# Patient Record
Sex: Female | Born: 1990 | Race: White | Hispanic: No | Marital: Single | Smoking: Current every day smoker
Health system: Southern US, Community
[De-identification: ages and names within clinical notes are randomized; demographics above are authoritative.]

## PROBLEM LIST (undated history)

## (undated) DIAGNOSIS — R000 Tachycardia, unspecified: Secondary | ICD-10-CM

## (undated) DIAGNOSIS — K029 Dental caries, unspecified: Secondary | ICD-10-CM

## (undated) DIAGNOSIS — O1490 Unspecified pre-eclampsia, unspecified trimester: Secondary | ICD-10-CM

## (undated) DIAGNOSIS — F319 Bipolar disorder, unspecified: Secondary | ICD-10-CM

## (undated) DIAGNOSIS — B1920 Unspecified viral hepatitis C without hepatic coma: Secondary | ICD-10-CM

## (undated) DIAGNOSIS — K08409 Partial loss of teeth, unspecified cause, unspecified class: Secondary | ICD-10-CM

## (undated) DIAGNOSIS — Z87898 Personal history of other specified conditions: Secondary | ICD-10-CM

## (undated) DIAGNOSIS — F419 Anxiety disorder, unspecified: Secondary | ICD-10-CM

## (undated) DIAGNOSIS — F4310 Post-traumatic stress disorder, unspecified: Secondary | ICD-10-CM

## (undated) DIAGNOSIS — F17200 Nicotine dependence, unspecified, uncomplicated: Secondary | ICD-10-CM

## (undated) DIAGNOSIS — F32A Depression, unspecified: Secondary | ICD-10-CM

## (undated) DIAGNOSIS — F1191 Opioid use, unspecified, in remission: Secondary | ICD-10-CM

## (undated) DIAGNOSIS — F329 Major depressive disorder, single episode, unspecified: Secondary | ICD-10-CM

## (undated) DIAGNOSIS — B1910 Unspecified viral hepatitis B without hepatic coma: Secondary | ICD-10-CM

## (undated) DIAGNOSIS — Z9049 Acquired absence of other specified parts of digestive tract: Secondary | ICD-10-CM

## (undated) HISTORY — DX: Unspecified viral hepatitis C without hepatic coma: B19.20

## (undated) HISTORY — DX: Personal history of other specified conditions: Z87.898

## (undated) HISTORY — PX: ANTERIOR CRUCIATE LIGAMENT REPAIR: SHX115

## (undated) HISTORY — PX: APPENDECTOMY: SHX54

## (undated) HISTORY — PX: WISDOM TOOTH EXTRACTION: SHX21

## (undated) HISTORY — DX: Unspecified viral hepatitis B without hepatic coma: B19.10

## (undated) HISTORY — DX: Opioid use, unspecified, in remission: F11.91

---
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
## 2008-01-24 ENCOUNTER — Emergency Department (HOSPITAL_COMMUNITY): Admission: EM | Admit: 2008-01-24 | Discharge: 2008-01-24 | Payer: Self-pay | Admitting: Emergency Medicine

## 2008-07-14 ENCOUNTER — Inpatient Hospital Stay (HOSPITAL_COMMUNITY): Admission: AD | Admit: 2008-07-14 | Discharge: 2008-07-14 | Payer: Self-pay | Admitting: Family Medicine

## 2008-08-02 ENCOUNTER — Emergency Department (HOSPITAL_COMMUNITY): Admission: EM | Admit: 2008-08-02 | Discharge: 2008-08-02 | Payer: Self-pay | Admitting: Emergency Medicine

## 2008-09-25 ENCOUNTER — Emergency Department (HOSPITAL_COMMUNITY): Admission: EM | Admit: 2008-09-25 | Discharge: 2008-09-25 | Payer: Self-pay | Admitting: Emergency Medicine

## 2008-10-11 ENCOUNTER — Emergency Department (HOSPITAL_COMMUNITY): Admission: EM | Admit: 2008-10-11 | Discharge: 2008-10-12 | Payer: Self-pay | Admitting: Emergency Medicine

## 2008-10-12 ENCOUNTER — Other Ambulatory Visit: Payer: Self-pay | Admitting: Emergency Medicine

## 2008-10-12 ENCOUNTER — Ambulatory Visit: Payer: Self-pay | Admitting: Psychiatry

## 2008-10-12 ENCOUNTER — Inpatient Hospital Stay (HOSPITAL_COMMUNITY): Admission: AD | Admit: 2008-10-12 | Discharge: 2008-10-21 | Payer: Self-pay | Admitting: Psychiatry

## 2008-12-01 ENCOUNTER — Emergency Department (HOSPITAL_COMMUNITY): Admission: EM | Admit: 2008-12-01 | Discharge: 2008-12-01 | Payer: Self-pay | Admitting: Emergency Medicine

## 2008-12-13 ENCOUNTER — Emergency Department (HOSPITAL_COMMUNITY): Admission: EM | Admit: 2008-12-13 | Discharge: 2008-12-14 | Payer: Self-pay | Admitting: Emergency Medicine

## 2008-12-24 ENCOUNTER — Other Ambulatory Visit: Payer: Self-pay

## 2008-12-24 ENCOUNTER — Ambulatory Visit: Payer: Self-pay | Admitting: *Deleted

## 2008-12-24 ENCOUNTER — Other Ambulatory Visit: Payer: Self-pay | Admitting: Emergency Medicine

## 2008-12-24 ENCOUNTER — Inpatient Hospital Stay (HOSPITAL_COMMUNITY): Admission: RE | Admit: 2008-12-24 | Discharge: 2008-12-28 | Payer: Self-pay | Admitting: *Deleted

## 2009-02-01 ENCOUNTER — Emergency Department (HOSPITAL_COMMUNITY): Admission: EM | Admit: 2009-02-01 | Discharge: 2009-02-01 | Payer: Self-pay | Admitting: Emergency Medicine

## 2009-03-09 ENCOUNTER — Emergency Department (HOSPITAL_COMMUNITY): Admission: EM | Admit: 2009-03-09 | Discharge: 2009-03-10 | Payer: Self-pay | Admitting: Emergency Medicine

## 2009-03-21 ENCOUNTER — Ambulatory Visit: Payer: Self-pay | Admitting: Advanced Practice Midwife

## 2009-03-21 ENCOUNTER — Inpatient Hospital Stay (HOSPITAL_COMMUNITY): Admission: AD | Admit: 2009-03-21 | Discharge: 2009-03-21 | Payer: Self-pay | Admitting: Obstetrics & Gynecology

## 2009-03-28 ENCOUNTER — Emergency Department (HOSPITAL_COMMUNITY): Admission: EM | Admit: 2009-03-28 | Discharge: 2009-03-29 | Payer: Self-pay | Admitting: Emergency Medicine

## 2009-04-12 ENCOUNTER — Inpatient Hospital Stay (HOSPITAL_COMMUNITY): Admission: AD | Admit: 2009-04-12 | Discharge: 2009-04-12 | Payer: Self-pay | Admitting: Obstetrics

## 2009-04-16 ENCOUNTER — Inpatient Hospital Stay (HOSPITAL_COMMUNITY): Admission: AD | Admit: 2009-04-16 | Discharge: 2009-04-20 | Payer: Self-pay | Admitting: Obstetrics

## 2009-04-19 IMAGING — US US TRANSVAGINAL NON-OB
1 series · 14 of 25 positions shown · non-contrast
Comparison: 07/14/2008 pelvic ultrasound.

CLINICAL DATA: Vaginal bleeding.  Pain.

TRANSABDOMINAL AND TRANSVAGINAL ULTRASOUND OF PELVIS
TECHNIQUE: Both transabdominal and transvaginal ultrasound
examinations of the pelvis were performed including evaluation of
the uterus, ovaries, adnexal regions, and pelvic cul-de-sac.

[Series 1: us transvaginal non-ob · 0.26mm/px · 14 of 35 slices shown]
[im 1/35]
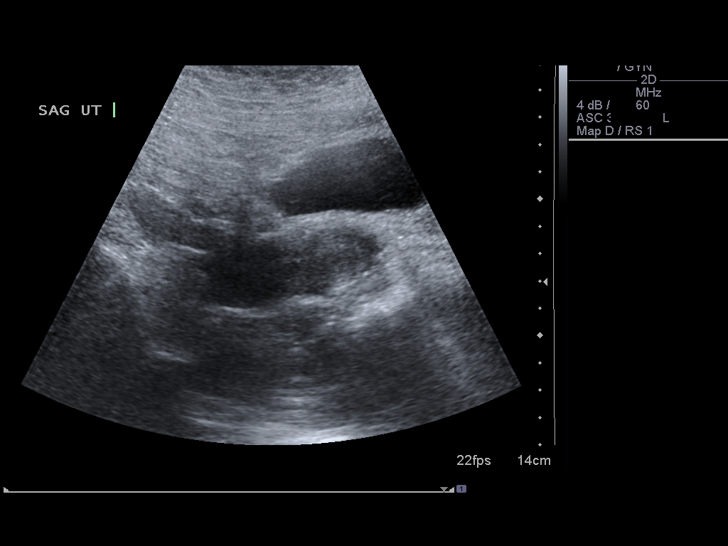
[im 3/35]
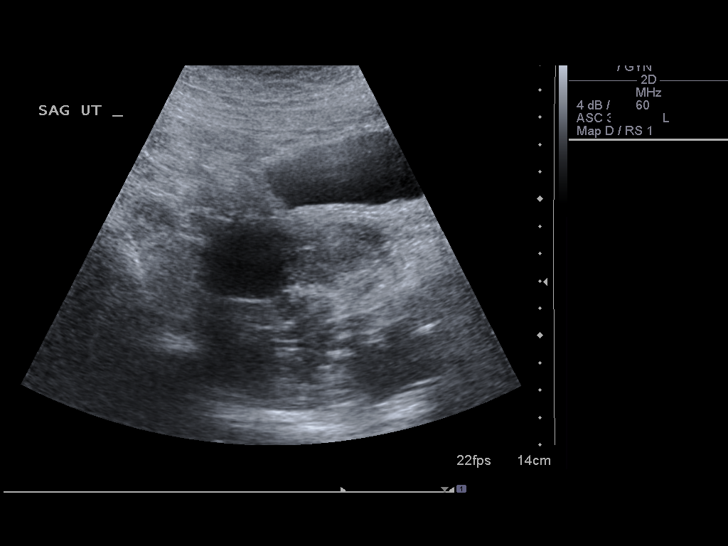
[im 6/35]
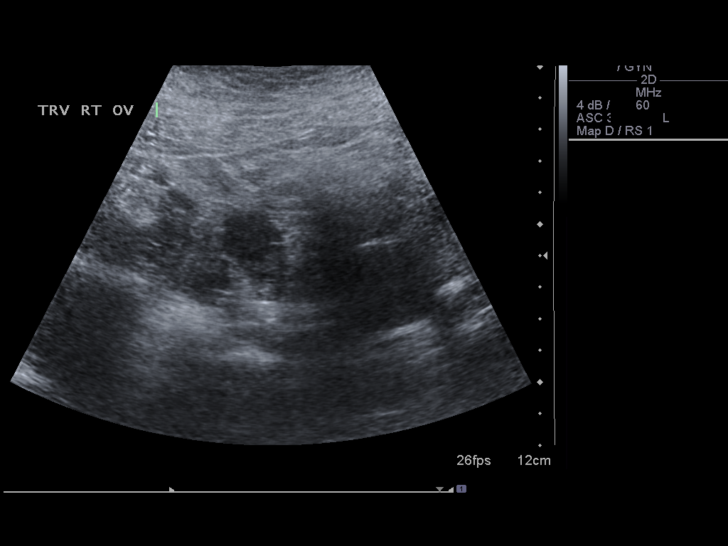
[im 9/35]
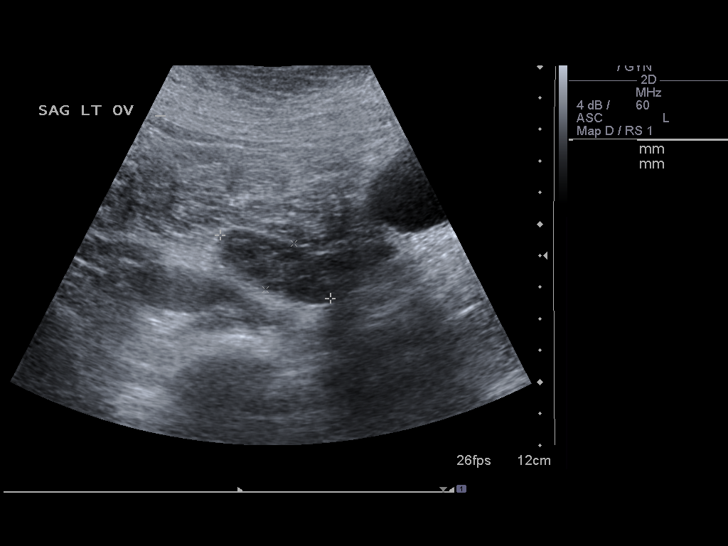
[im 12/35]
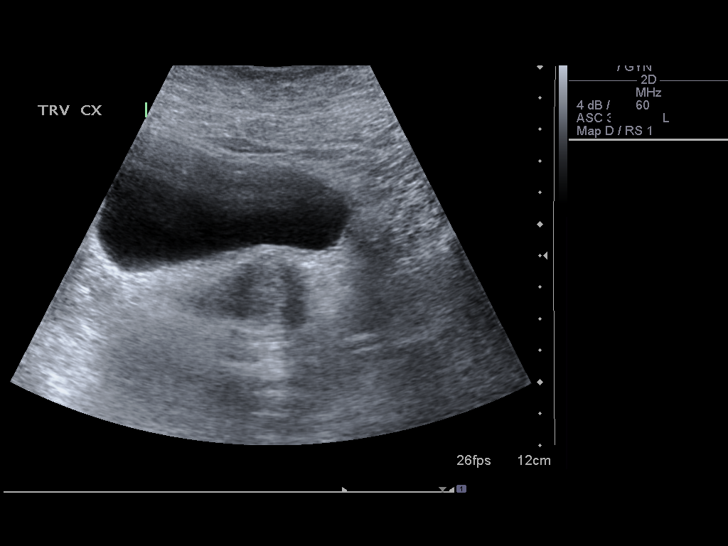
[im 13/35]
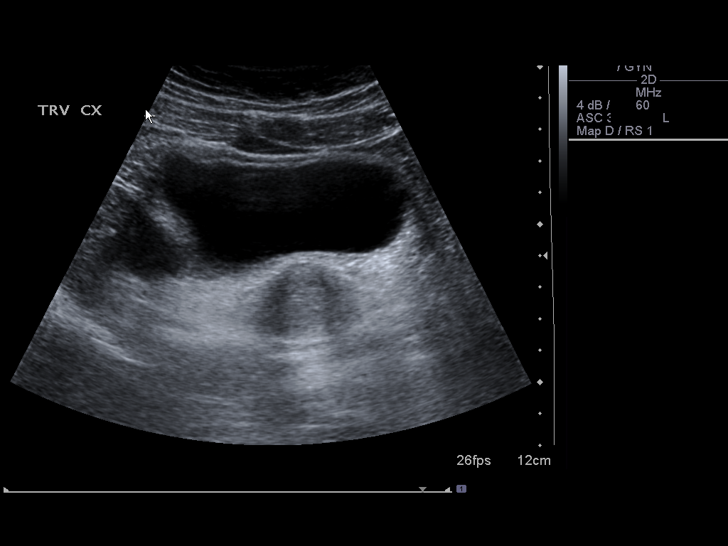
[im 16/35]
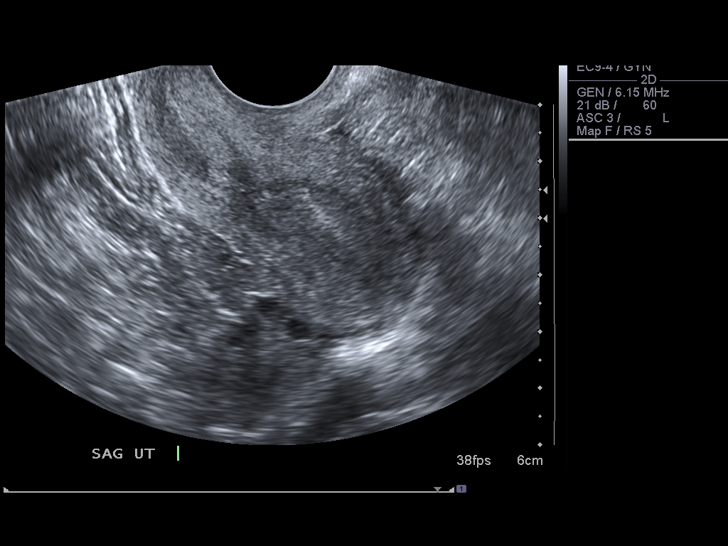
[im 19/35]
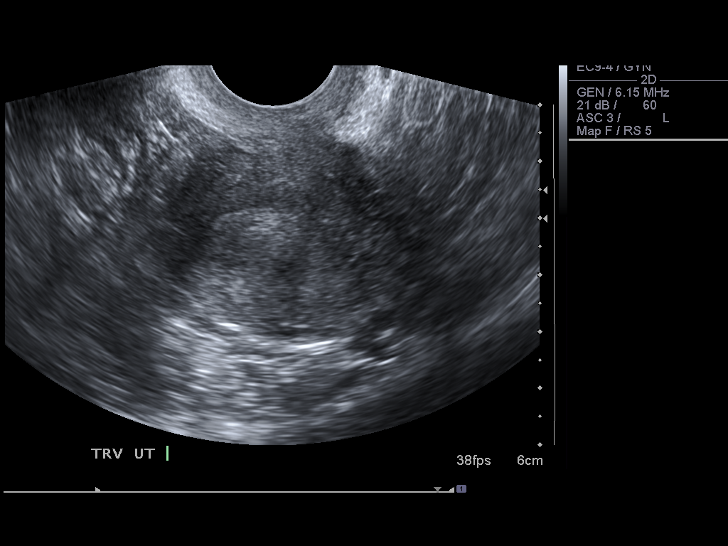
[im 22/35]
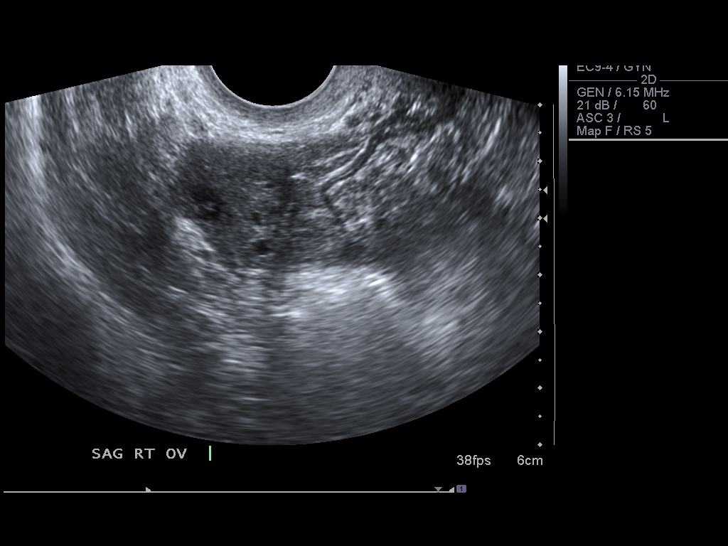
[im 23/35]
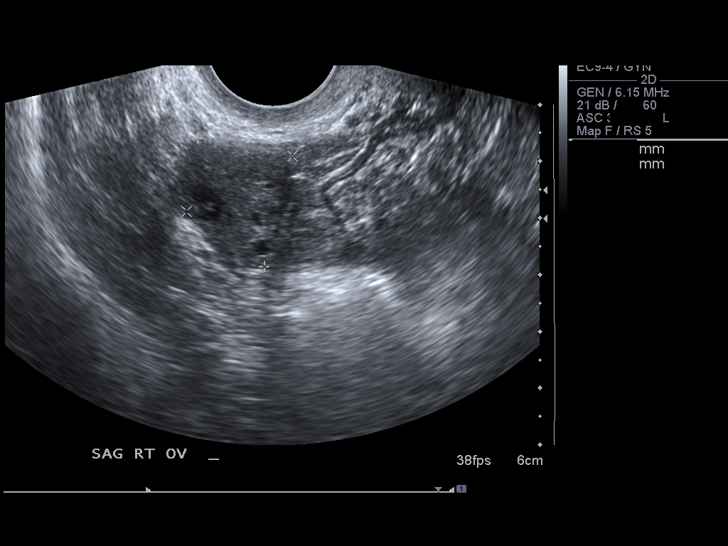
[im 26/35]
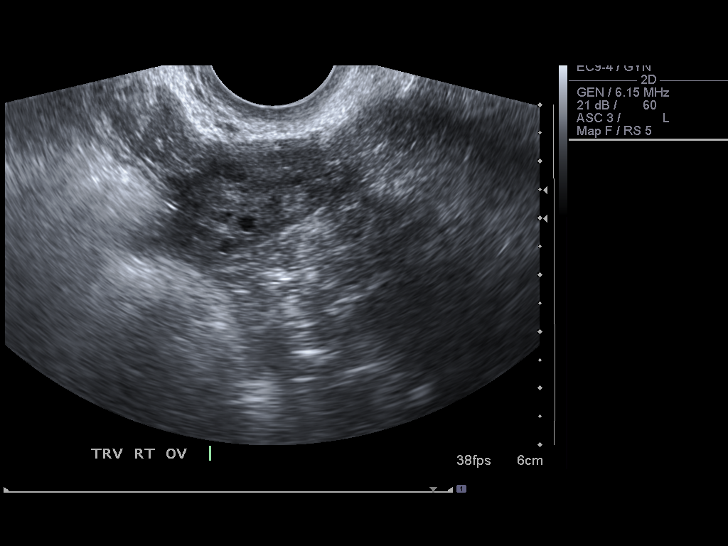
[im 29/35]
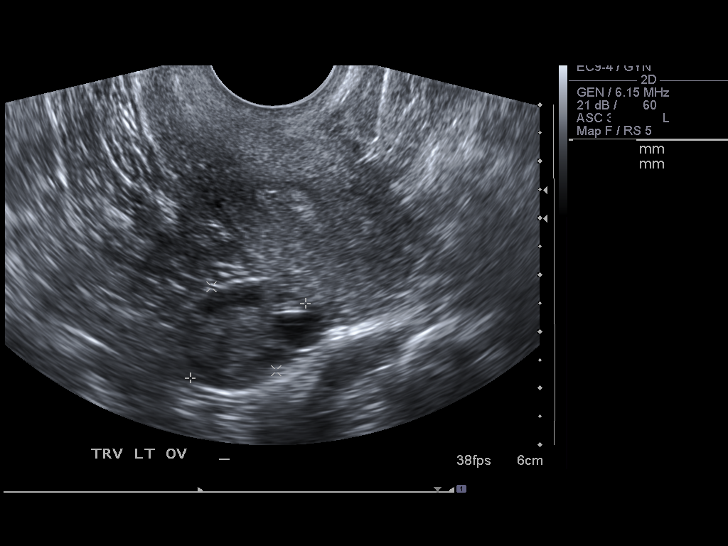
[im 32/35]
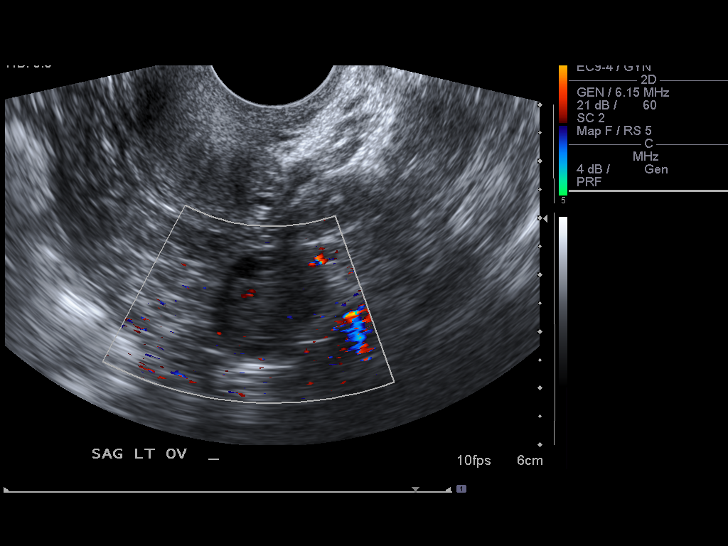
[im 35/35]
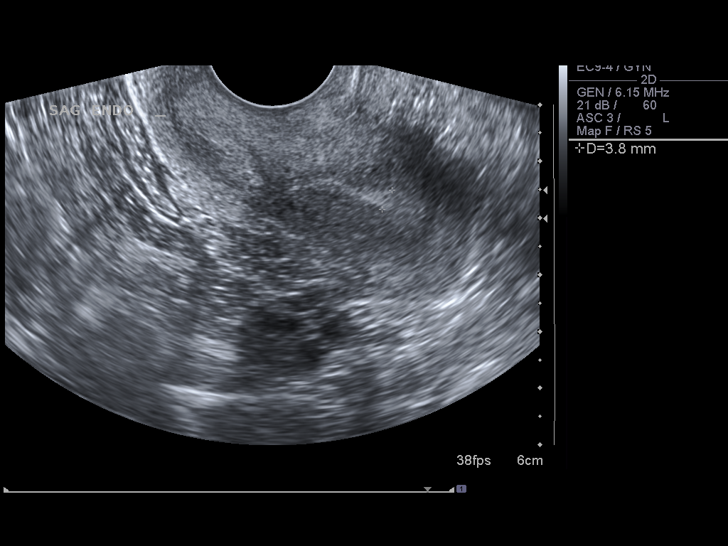

[14 of 25 positions shown; findings below may reference images not displayed]

FINDINGS: The uterus is retroverted measuring 6.3 x 4.3 x 3.4 cm.
Endometrial stripe thickness is 0.4 cm.  The right ovary measures
3.0 x 2.5 x 2.1 cm left ovary measures 2.4 x 2.1 x 1.9 cm.  Both
ovaries appear normal.  No free pelvic fluid. No mass.
IMPRESSION: Normal pelvic ultrasound.

## 2009-06-05 ENCOUNTER — Inpatient Hospital Stay (HOSPITAL_COMMUNITY): Admission: AD | Admit: 2009-06-05 | Discharge: 2009-06-05 | Payer: Self-pay | Admitting: Obstetrics

## 2009-06-21 ENCOUNTER — Ambulatory Visit (HOSPITAL_COMMUNITY): Admission: RE | Admit: 2009-06-21 | Discharge: 2009-06-21 | Payer: Self-pay | Admitting: Obstetrics

## 2009-07-02 ENCOUNTER — Ambulatory Visit: Payer: Self-pay | Admitting: Obstetrics and Gynecology

## 2009-07-02 ENCOUNTER — Inpatient Hospital Stay (HOSPITAL_COMMUNITY): Admission: AD | Admit: 2009-07-02 | Discharge: 2009-07-02 | Payer: Self-pay | Admitting: Obstetrics & Gynecology

## 2009-09-07 ENCOUNTER — Ambulatory Visit (HOSPITAL_COMMUNITY): Admission: RE | Admit: 2009-09-07 | Discharge: 2009-09-07 | Payer: Self-pay | Admitting: Obstetrics

## 2009-09-21 ENCOUNTER — Inpatient Hospital Stay (HOSPITAL_COMMUNITY): Admission: AD | Admit: 2009-09-21 | Discharge: 2009-09-22 | Payer: Self-pay | Admitting: Obstetrics & Gynecology

## 2009-09-22 ENCOUNTER — Inpatient Hospital Stay (HOSPITAL_COMMUNITY): Admission: AD | Admit: 2009-09-22 | Discharge: 2009-09-22 | Payer: Self-pay | Admitting: Obstetrics

## 2009-10-25 ENCOUNTER — Inpatient Hospital Stay (HOSPITAL_COMMUNITY): Admission: AD | Admit: 2009-10-25 | Discharge: 2009-10-25 | Payer: Self-pay | Admitting: Obstetrics

## 2009-11-23 ENCOUNTER — Inpatient Hospital Stay (HOSPITAL_COMMUNITY)
Admission: AD | Admit: 2009-11-23 | Discharge: 2009-11-25 | Payer: Self-pay | Source: Home / Self Care | Admitting: Obstetrics

## 2009-11-28 ENCOUNTER — Inpatient Hospital Stay (HOSPITAL_COMMUNITY)
Admission: AD | Admit: 2009-11-28 | Discharge: 2009-12-03 | Payer: Self-pay | Source: Home / Self Care | Admitting: Obstetrics & Gynecology

## 2009-11-30 ENCOUNTER — Encounter: Payer: Self-pay | Admitting: Obstetrics & Gynecology

## 2009-11-30 DIAGNOSIS — O1490 Unspecified pre-eclampsia, unspecified trimester: Secondary | ICD-10-CM

## 2009-12-30 ENCOUNTER — Inpatient Hospital Stay (HOSPITAL_COMMUNITY): Admission: AD | Admit: 2009-12-30 | Discharge: 2009-12-30 | Payer: Self-pay | Admitting: Obstetrics

## 2010-01-03 ENCOUNTER — Inpatient Hospital Stay (HOSPITAL_COMMUNITY): Admission: AD | Admit: 2010-01-03 | Discharge: 2010-01-03 | Payer: Self-pay | Admitting: Obstetrics & Gynecology

## 2010-01-03 ENCOUNTER — Ambulatory Visit: Payer: Self-pay | Admitting: Nurse Practitioner

## 2010-01-30 ENCOUNTER — Emergency Department (HOSPITAL_COMMUNITY)
Admission: EM | Admit: 2010-01-30 | Discharge: 2010-01-31 | Payer: Self-pay | Source: Home / Self Care | Admitting: Emergency Medicine

## 2010-04-01 ENCOUNTER — Emergency Department (HOSPITAL_COMMUNITY): Admission: EM | Admit: 2010-04-01 | Discharge: 2010-04-01 | Payer: Self-pay | Admitting: Emergency Medicine

## 2010-05-24 ENCOUNTER — Emergency Department (HOSPITAL_COMMUNITY): Admission: EM | Admit: 2010-05-24 | Discharge: 2010-05-24 | Payer: Self-pay | Admitting: Family Medicine

## 2010-05-31 ENCOUNTER — Encounter: Admission: RE | Admit: 2010-05-31 | Discharge: 2010-05-31 | Payer: Self-pay | Admitting: Gastroenterology

## 2010-06-09 ENCOUNTER — Emergency Department (HOSPITAL_COMMUNITY): Admission: EM | Admit: 2010-06-09 | Discharge: 2010-06-09 | Payer: Self-pay | Admitting: Emergency Medicine

## 2010-10-04 LAB — RAPID STREP SCREEN (MED CTR MEBANE ONLY): Streptococcus, Group A Screen (Direct): NEGATIVE

## 2010-10-04 LAB — STREP A DNA PROBE: Group A Strep Probe: NEGATIVE

## 2010-10-09 LAB — COMPREHENSIVE METABOLIC PANEL
Alkaline Phosphatase: 67 U/L (ref 39–117)
BUN: 11 mg/dL (ref 6–23)
Chloride: 106 mEq/L (ref 96–112)
GFR calc non Af Amer: >60 mL/min (ref >60)
Glucose, Bld: 77 mg/dL (ref 70–99)
Potassium: 3.6 mEq/L (ref 3.5–5.1)
Total Bilirubin: 0.6 mg/dL (ref 0.3–1.2)

## 2010-10-09 LAB — WET PREP, GENITAL: Trich, Wet Prep: NONE SEEN

## 2010-10-09 LAB — RAPID URINE DRUG SCREEN, HOSP PERFORMED
Amphetamines: NOT DETECTED
Barbiturates: NOT DETECTED
Benzodiazepines: NOT DETECTED
Opiates: POSITIVE — AB
Tetrahydrocannabinol: NOT DETECTED

## 2010-10-09 LAB — URINALYSIS, ROUTINE W REFLEX MICROSCOPIC
Bilirubin Urine: NEGATIVE
Glucose, UA: NEGATIVE mg/dL
Hgb urine dipstick: NEGATIVE
Specific Gravity, Urine: 1.021 (ref 1.005–1.030)
pH: 5.5 (ref 5.0–8.0)

## 2010-10-09 LAB — DIFFERENTIAL
Basophils Absolute: 0 10*3/uL (ref 0.0–0.1)
Basophils Relative: 0 % (ref 0–1)
Neutro Abs: 7.3 10*3/uL (ref 1.7–7.7)
Neutrophils Relative %: 67 % (ref 43–77)

## 2010-10-09 LAB — CBC
Hemoglobin: 11.1 g/dL — ABNORMAL LOW (ref 12.0–15.0)
RDW: 18.3 % — ABNORMAL HIGH (ref 11.5–15.5)

## 2010-10-10 LAB — URINE MICROSCOPIC-ADD ON

## 2010-10-10 LAB — URINALYSIS, ROUTINE W REFLEX MICROSCOPIC
Bilirubin Urine: NEGATIVE
Ketones, ur: NEGATIVE mg/dL
Nitrite: NEGATIVE
Protein, ur: NEGATIVE mg/dL
Specific Gravity, Urine: >1.03 — ABNORMAL HIGH (ref 1.005–1.030)
Urobilinogen, UA: 0.2 mg/dL (ref 0.0–1.0)
pH: 5.5 (ref 5.0–8.0)

## 2010-10-10 LAB — CBC
HCT: 31.4 % — ABNORMAL LOW (ref 36.0–46.0)
Hemoglobin: 10.4 g/dL — ABNORMAL LOW (ref 12.0–15.0)
MCV: 76.2 fL — ABNORMAL LOW (ref 78.0–100.0)
Platelets: 259 10*3/uL (ref 150–400)
RBC: 4.12 MIL/uL (ref 3.87–5.11)
WBC: 9.3 10*3/uL (ref 4.0–10.5)

## 2010-10-10 LAB — WET PREP, GENITAL: Clue Cells Wet Prep HPF POC: NONE SEEN

## 2010-10-11 LAB — COMPREHENSIVE METABOLIC PANEL
Albumin: 2.8 g/dL — ABNORMAL LOW (ref 3.5–5.2)
Alkaline Phosphatase: 140 U/L — ABNORMAL HIGH (ref 39–117)
BUN: 3 mg/dL — ABNORMAL LOW (ref 6–23)
Creatinine, Ser: 0.44 mg/dL (ref 0.4–1.2)
Glucose, Bld: 61 mg/dL — ABNORMAL LOW (ref 70–99)
Potassium: 3.6 mEq/L (ref 3.5–5.1)
Total Bilirubin: 0.2 mg/dL — ABNORMAL LOW (ref 0.3–1.2)
Total Protein: 5.8 g/dL — ABNORMAL LOW (ref 6.0–8.3)

## 2010-10-11 LAB — CBC
HCT: 23.9 % — ABNORMAL LOW (ref 36.0–46.0)
HCT: 35.2 % — ABNORMAL LOW (ref 36.0–46.0)
HCT: 35.9 % — ABNORMAL LOW (ref 36.0–46.0)
HCT: 36.5 % (ref 36.0–46.0)
Hemoglobin: 12 g/dL (ref 12.0–15.0)
Hemoglobin: 12.1 g/dL (ref 12.0–15.0)
Hemoglobin: 12.6 g/dL (ref 12.0–15.0)
MCHC: 33.5 g/dL (ref 30.0–36.0)
MCV: 82 fL (ref 78.0–100.0)
MCV: 83.1 fL (ref 78.0–100.0)
Platelets: 116 10*3/uL — ABNORMAL LOW (ref 150–400)
Platelets: 134 10*3/uL — ABNORMAL LOW (ref 150–400)
RBC: 4.32 MIL/uL (ref 3.87–5.11)
RBC: 4.45 MIL/uL (ref 3.87–5.11)
RDW: 15.3 % (ref 11.5–15.5)
RDW: 15.5 % (ref 11.5–15.5)
WBC: 14.5 10*3/uL — ABNORMAL HIGH (ref 4.0–10.5)
WBC: 14.9 10*3/uL — ABNORMAL HIGH (ref 4.0–10.5)
WBC: 15.4 10*3/uL — ABNORMAL HIGH (ref 4.0–10.5)

## 2010-10-11 LAB — URIC ACID: Uric Acid, Serum: 7 mg/dL (ref 2.4–7.0)

## 2010-10-11 LAB — LACTATE DEHYDROGENASE: LDH: 166 U/L (ref 94–250)

## 2010-10-12 LAB — COMPREHENSIVE METABOLIC PANEL
ALT: 8 U/L (ref 0–35)
Albumin: 3 g/dL — ABNORMAL LOW (ref 3.5–5.2)
Alkaline Phosphatase: 133 U/L — ABNORMAL HIGH (ref 39–117)
BUN: 2 mg/dL — ABNORMAL LOW (ref 6–23)
Calcium: 9.1 mg/dL (ref 8.4–10.5)
Glucose, Bld: 71 mg/dL (ref 70–99)
Potassium: 3.9 mEq/L (ref 3.5–5.1)
Sodium: 137 mEq/L (ref 135–145)
Total Protein: 6.1 g/dL (ref 6.0–8.3)

## 2010-10-12 LAB — CBC
Hemoglobin: 11.9 g/dL — ABNORMAL LOW (ref 12.0–15.0)
RBC: 4.26 MIL/uL (ref 3.87–5.11)
WBC: 18 10*3/uL — ABNORMAL HIGH (ref 4.0–10.5)

## 2010-10-12 LAB — URINALYSIS, ROUTINE W REFLEX MICROSCOPIC
Nitrite: NEGATIVE
Specific Gravity, Urine: 1.02 (ref 1.005–1.030)
Urobilinogen, UA: 0.2 mg/dL (ref 0.0–1.0)
pH: 8 (ref 5.0–8.0)

## 2010-10-12 LAB — URIC ACID: Uric Acid, Serum: 5.6 mg/dL (ref 2.4–7.0)

## 2010-10-12 LAB — LACTATE DEHYDROGENASE: LDH: 157 U/L (ref 94–250)

## 2010-10-16 LAB — URINALYSIS, ROUTINE W REFLEX MICROSCOPIC
Leukocytes, UA: NEGATIVE
Nitrite: NEGATIVE
Nitrite: NEGATIVE
Protein, ur: 30 mg/dL — AB
Specific Gravity, Urine: 1.01 (ref 1.005–1.030)
Specific Gravity, Urine: >1.03 — ABNORMAL HIGH (ref 1.005–1.030)
Urobilinogen, UA: 0.2 mg/dL (ref 0.0–1.0)
Urobilinogen, UA: 0.2 mg/dL (ref 0.0–1.0)
pH: 6.5 (ref 5.0–8.0)

## 2010-10-16 LAB — URINE MICROSCOPIC-ADD ON

## 2010-10-21 ENCOUNTER — Ambulatory Visit: Payer: Medicare Other | Attending: Orthopedic Surgery

## 2010-10-21 DIAGNOSIS — M6281 Muscle weakness (generalized): Secondary | ICD-10-CM | POA: Insufficient documentation

## 2010-10-21 DIAGNOSIS — R269 Unspecified abnormalities of gait and mobility: Secondary | ICD-10-CM | POA: Insufficient documentation

## 2010-10-21 DIAGNOSIS — IMO0001 Reserved for inherently not codable concepts without codable children: Secondary | ICD-10-CM | POA: Insufficient documentation

## 2010-10-21 DIAGNOSIS — M25569 Pain in unspecified knee: Secondary | ICD-10-CM | POA: Insufficient documentation

## 2010-10-25 ENCOUNTER — Ambulatory Visit: Payer: Medicare Other | Admitting: Physical Therapy

## 2010-10-26 ENCOUNTER — Ambulatory Visit: Payer: Medicare Other | Attending: Orthopedic Surgery

## 2010-10-26 DIAGNOSIS — M25569 Pain in unspecified knee: Secondary | ICD-10-CM | POA: Insufficient documentation

## 2010-10-26 DIAGNOSIS — IMO0001 Reserved for inherently not codable concepts without codable children: Secondary | ICD-10-CM | POA: Insufficient documentation

## 2010-10-26 DIAGNOSIS — R269 Unspecified abnormalities of gait and mobility: Secondary | ICD-10-CM | POA: Insufficient documentation

## 2010-10-26 DIAGNOSIS — M6281 Muscle weakness (generalized): Secondary | ICD-10-CM | POA: Insufficient documentation

## 2010-10-26 LAB — GC/CHLAMYDIA PROBE AMP, GENITAL
Chlamydia, DNA Probe: NEGATIVE
GC Probe Amp, Genital: NEGATIVE

## 2010-10-26 LAB — DIFFERENTIAL
Basophils Relative: 0 % (ref 0–1)
Lymphs Abs: 2.1 10*3/uL (ref 0.7–4.0)
Monocytes Relative: 7 % (ref 3–12)
Neutro Abs: 10.3 10*3/uL — ABNORMAL HIGH (ref 1.7–7.7)
Neutrophils Relative %: 77 % (ref 43–77)

## 2010-10-26 LAB — WET PREP, GENITAL: Yeast Wet Prep HPF POC: NONE SEEN

## 2010-10-26 LAB — CBC
MCHC: 34.1 g/dL (ref 30.0–36.0)
Platelets: 190 10*3/uL (ref 150–400)
RBC: 4.3 MIL/uL (ref 3.87–5.11)
WBC: 13.4 10*3/uL — ABNORMAL HIGH (ref 4.0–10.5)

## 2010-10-26 LAB — URINALYSIS, ROUTINE W REFLEX MICROSCOPIC
Nitrite: NEGATIVE
Protein, ur: NEGATIVE mg/dL
Specific Gravity, Urine: 1.01 (ref 1.005–1.030)
Urobilinogen, UA: 0.2 mg/dL (ref 0.0–1.0)

## 2010-10-28 LAB — COMPREHENSIVE METABOLIC PANEL
ALT: 10 U/L (ref 0–35)
ALT: 33 U/L (ref 0–35)
AST: 49 U/L — ABNORMAL HIGH (ref 0–37)
Alkaline Phosphatase: 55 U/L (ref 39–117)
BUN: 2 mg/dL — ABNORMAL LOW (ref 6–23)
BUN: 5 mg/dL — ABNORMAL LOW (ref 6–23)
CO2: 18 mEq/L — ABNORMAL LOW (ref 19–32)
CO2: 21 mEq/L (ref 19–32)
CO2: 22 mEq/L (ref 19–32)
Calcium: 8.6 mg/dL (ref 8.4–10.5)
Calcium: 8.8 mg/dL (ref 8.4–10.5)
Calcium: 8.9 mg/dL (ref 8.4–10.5)
Chloride: 109 mEq/L (ref 96–112)
Creatinine, Ser: 0.39 mg/dL — ABNORMAL LOW (ref 0.4–1.2)
Creatinine, Ser: 0.45 mg/dL (ref 0.4–1.2)
GFR calc Af Amer: >60 mL/min (ref >60)
GFR calc non Af Amer: >60 mL/min (ref >60)
GFR calc non Af Amer: >60 mL/min (ref >60)
Glucose, Bld: 67 mg/dL — ABNORMAL LOW (ref 70–99)
Glucose, Bld: 79 mg/dL (ref 70–99)
Potassium: 3.3 mEq/L — ABNORMAL LOW (ref 3.5–5.1)
Sodium: 132 mEq/L — ABNORMAL LOW (ref 135–145)
Sodium: 135 mEq/L (ref 135–145)
Total Bilirubin: 0.4 mg/dL (ref 0.3–1.2)
Total Protein: 6.8 g/dL (ref 6.0–8.3)
Total Protein: 7.2 g/dL (ref 6.0–8.3)

## 2010-10-28 LAB — URINALYSIS, ROUTINE W REFLEX MICROSCOPIC
Bilirubin Urine: NEGATIVE
Bilirubin Urine: NEGATIVE
Hgb urine dipstick: NEGATIVE
Ketones, ur: NEGATIVE mg/dL
Nitrite: NEGATIVE
Nitrite: NEGATIVE
Nitrite: NEGATIVE
Protein, ur: NEGATIVE mg/dL
Specific Gravity, Urine: 1.025 (ref 1.005–1.030)
Specific Gravity, Urine: 1.025 (ref 1.005–1.030)
Specific Gravity, Urine: 1.03 (ref 1.005–1.030)
Urobilinogen, UA: 0.2 mg/dL (ref 0.0–1.0)
Urobilinogen, UA: 0.2 mg/dL (ref 0.0–1.0)
pH: 6 (ref 5.0–8.0)
pH: 6 (ref 5.0–8.0)

## 2010-10-28 LAB — CBC
HCT: 39.6 % (ref 36.0–46.0)
Hemoglobin: 12.2 g/dL (ref 12.0–15.0)
Hemoglobin: 13.5 g/dL (ref 12.0–15.0)
MCHC: 34 g/dL (ref 30.0–36.0)
MCHC: 34.1 g/dL (ref 30.0–36.0)
MCV: 83.8 fL (ref 78.0–100.0)
RBC: 4.3 MIL/uL (ref 3.87–5.11)
RBC: 4.73 MIL/uL (ref 3.87–5.11)
RDW: 14.8 % (ref 11.5–15.5)

## 2010-10-28 LAB — ABO/RH: ABO/RH(D): O POS

## 2010-10-28 LAB — HCG, QUANTITATIVE, PREGNANCY: hCG, Beta Chain, Quant, S: 11050 m[IU]/mL — ABNORMAL HIGH (ref <5)

## 2010-10-28 LAB — POCT PREGNANCY, URINE: Preg Test, Ur: POSITIVE

## 2010-10-28 LAB — WET PREP, GENITAL

## 2010-10-28 LAB — PREGNANCY, URINE: Preg Test, Ur: POSITIVE

## 2010-10-29 LAB — URINALYSIS, ROUTINE W REFLEX MICROSCOPIC
Bilirubin Urine: NEGATIVE
Glucose, UA: NEGATIVE mg/dL
Glucose, UA: NEGATIVE mg/dL
Ketones, ur: NEGATIVE mg/dL
Ketones, ur: NEGATIVE mg/dL
Protein, ur: NEGATIVE mg/dL
pH: 5.5 (ref 5.0–8.0)

## 2010-10-29 LAB — COMPREHENSIVE METABOLIC PANEL
ALT: 10 U/L (ref 0–35)
Albumin: 4.2 g/dL (ref 3.5–5.2)
Alkaline Phosphatase: 64 U/L (ref 39–117)
BUN: 8 mg/dL (ref 6–23)
Chloride: 108 mEq/L (ref 96–112)
Glucose, Bld: 91 mg/dL (ref 70–99)
Potassium: 3.3 mEq/L — ABNORMAL LOW (ref 3.5–5.1)
Total Bilirubin: 0.1 mg/dL — ABNORMAL LOW (ref 0.3–1.2)

## 2010-10-29 LAB — WET PREP, GENITAL: Trich, Wet Prep: NONE SEEN

## 2010-10-29 LAB — DIFFERENTIAL
Eosinophils Relative: 1 % (ref 0–5)
Lymphocytes Relative: 28 % (ref 12–46)
Lymphs Abs: 3.9 10*3/uL (ref 0.7–4.0)
Monocytes Absolute: 1.3 10*3/uL — ABNORMAL HIGH (ref 0.1–1.0)

## 2010-10-29 LAB — BASIC METABOLIC PANEL
GFR calc non Af Amer: >60 mL/min (ref >60)
Potassium: 3.3 mEq/L — ABNORMAL LOW (ref 3.5–5.1)
Sodium: 137 mEq/L (ref 135–145)

## 2010-10-29 LAB — CBC
HCT: 40.1 % (ref 36.0–46.0)
Hemoglobin: 13.6 g/dL (ref 12.0–15.0)
WBC: 14.2 10*3/uL — ABNORMAL HIGH (ref 4.0–10.5)

## 2010-10-29 LAB — POCT PREGNANCY, URINE: Preg Test, Ur: NEGATIVE

## 2010-10-29 LAB — RAPID URINE DRUG SCREEN, HOSP PERFORMED: Cocaine: NOT DETECTED

## 2010-10-29 LAB — HCG, QUANTITATIVE, PREGNANCY: hCG, Beta Chain, Quant, S: 1176 m[IU]/mL — ABNORMAL HIGH (ref <5)

## 2010-10-29 LAB — ETHANOL: Alcohol, Ethyl (B): <5 mg/dL (ref 0–10)

## 2010-10-31 ENCOUNTER — Ambulatory Visit: Payer: Medicare Other

## 2010-10-31 LAB — RAPID URINE DRUG SCREEN, HOSP PERFORMED
Barbiturates: NOT DETECTED
Benzodiazepines: POSITIVE — AB

## 2010-10-31 LAB — ETHANOL: Alcohol, Ethyl (B): <5 mg/dL (ref 0–10)

## 2010-10-31 LAB — DIFFERENTIAL
Basophils Absolute: 0.2 10*3/uL — ABNORMAL HIGH (ref 0.0–0.1)
Eosinophils Relative: 1 % (ref 0–5)
Lymphocytes Relative: 30 % (ref 12–46)
Lymphs Abs: 3.5 10*3/uL (ref 0.7–4.0)
Monocytes Absolute: 1.2 10*3/uL — ABNORMAL HIGH (ref 0.1–1.0)
Neutro Abs: 6.7 10*3/uL (ref 1.7–7.7)

## 2010-10-31 LAB — CBC
HCT: 38.3 % (ref 36.0–46.0)
Hemoglobin: 13.3 g/dL (ref 12.0–15.0)
RBC: 4.67 MIL/uL (ref 3.87–5.11)
WBC: 11.6 10*3/uL — ABNORMAL HIGH (ref 4.0–10.5)

## 2010-10-31 LAB — ACETAMINOPHEN LEVEL: Acetaminophen (Tylenol), Serum: 18.1 ug/mL (ref 10–30)

## 2010-10-31 LAB — BASIC METABOLIC PANEL
Calcium: 8.9 mg/dL (ref 8.4–10.5)
GFR calc Af Amer: >60 mL/min (ref >60)
GFR calc non Af Amer: >60 mL/min (ref >60)
Potassium: 3.4 mEq/L — ABNORMAL LOW (ref 3.5–5.1)
Sodium: 139 mEq/L (ref 135–145)

## 2010-10-31 LAB — TRICYCLICS SCREEN, URINE: TCA Scrn: NOT DETECTED

## 2010-10-31 LAB — PREGNANCY, URINE: Preg Test, Ur: NEGATIVE

## 2010-11-01 LAB — POCT I-STAT, CHEM 8
BUN: 10 mg/dL (ref 6–23)
BUN: 6 mg/dL (ref 6–23)
Creatinine, Ser: 0.7 mg/dL (ref 0.4–1.2)
Glucose, Bld: 60 mg/dL — ABNORMAL LOW (ref 70–99)
HCT: 39 % (ref 36.0–46.0)
Hemoglobin: 14.3 g/dL (ref 12.0–15.0)
Sodium: 135 mEq/L (ref 135–145)
TCO2: 28 mmol/L (ref 0–100)
TCO2: 26 mmol/L (ref 0–100)

## 2010-11-01 LAB — URINALYSIS, ROUTINE W REFLEX MICROSCOPIC
Glucose, UA: NEGATIVE mg/dL
Ketones, ur: NEGATIVE mg/dL
Nitrite: NEGATIVE
Protein, ur: NEGATIVE mg/dL
Protein, ur: NEGATIVE mg/dL
Urobilinogen, UA: 1 mg/dL (ref 0.0–1.0)
Urobilinogen, UA: 0.2 mg/dL (ref 0.0–1.0)

## 2010-11-01 LAB — URINE CULTURE
Colony Count: NO GROWTH
Culture: NO GROWTH

## 2010-11-01 LAB — GC/CHLAMYDIA PROBE AMP, GENITAL
Chlamydia, DNA Probe: NEGATIVE
GC Probe Amp, Genital: NEGATIVE

## 2010-11-01 LAB — SYPHILIS: RPR W/REFLEX TO RPR TITER AND TREPONEMAL ANTIBODIES, TRADITIONAL SCREENING AND DIAGNOSIS ALGORITHM: RPR Ser Ql: NONREACTIVE

## 2010-11-01 LAB — CBC
HCT: 39.8 % (ref 36.0–46.0)
Platelets: 249 10*3/uL (ref 150–400)
RDW: 14.9 % (ref 11.5–15.5)
WBC: 11.2 10*3/uL — ABNORMAL HIGH (ref 4.0–10.5)

## 2010-11-01 LAB — RAPID URINE DRUG SCREEN, HOSP PERFORMED
Amphetamines: NOT DETECTED
Barbiturates: NOT DETECTED
Cocaine: NOT DETECTED
Opiates: POSITIVE — AB
Tetrahydrocannabinol: NOT DETECTED

## 2010-11-01 LAB — APTT: aPTT: 33 s (ref 24–37)

## 2010-11-01 LAB — DIFFERENTIAL
Basophils Absolute: 0.1 10*3/uL (ref 0.0–0.1)
Lymphocytes Relative: 26 % (ref 12–46)
Lymphs Abs: 2.9 10*3/uL (ref 0.7–4.0)
Neutro Abs: 7 10*3/uL (ref 1.7–7.7)

## 2010-11-01 LAB — WET PREP, GENITAL
Clue Cells Wet Prep HPF POC: NONE SEEN
Yeast Wet Prep HPF POC: NONE SEEN

## 2010-11-01 LAB — POCT PREGNANCY, URINE
Preg Test, Ur: NEGATIVE
Preg Test, Ur: NEGATIVE

## 2010-11-01 LAB — PROTIME-INR
INR: 1 (ref 0.00–1.49)
Prothrombin Time: 13.8 s (ref 11.6–15.2)

## 2010-11-01 LAB — D-DIMER, QUANTITATIVE: D-Dimer, Quant: 0.38 ug/mL-FEU (ref 0.00–0.48)

## 2010-11-01 LAB — URINE MICROSCOPIC-ADD ON

## 2010-11-02 ENCOUNTER — Ambulatory Visit: Payer: Medicare Other

## 2010-11-03 LAB — RAPID URINE DRUG SCREEN, HOSP PERFORMED
Amphetamines: NOT DETECTED
Amphetamines: NOT DETECTED
Barbiturates: NOT DETECTED
Cocaine: NOT DETECTED
Cocaine: NOT DETECTED
Opiates: NOT DETECTED
Opiates: NOT DETECTED
Tetrahydrocannabinol: NOT DETECTED
Tetrahydrocannabinol: POSITIVE — AB

## 2010-11-03 LAB — POCT I-STAT, CHEM 8
BUN: 8 mg/dL (ref 6–23)
Calcium, Ion: 1.07 mmol/L — ABNORMAL LOW (ref 1.12–1.32)
Calcium, Ion: 1.17 mmol/L (ref 1.12–1.32)
Chloride: 111 mEq/L (ref 96–112)
Creatinine, Ser: 0.5 mg/dL (ref 0.4–1.2)
Glucose, Bld: 102 mg/dL — ABNORMAL HIGH (ref 70–99)
HCT: 39 % (ref 36.0–46.0)
Hemoglobin: 15 g/dL (ref 12.0–15.0)
Sodium: 139 mEq/L (ref 135–145)
TCO2: 18 mmol/L (ref 0–100)
TCO2: 21 mmol/L (ref 0–100)

## 2010-11-03 LAB — DIFFERENTIAL
Basophils Absolute: 0 10*3/uL (ref 0.0–0.1)
Basophils Relative: 0 % (ref 0–1)
Eosinophils Absolute: 0.1 10*3/uL (ref 0.0–0.7)
Eosinophils Relative: 1 % (ref 0–5)
Eosinophils Relative: 1 % (ref 0–5)
Lymphocytes Relative: 21 % (ref 12–46)
Lymphocytes Relative: 24 % (ref 12–46)
Lymphs Abs: 3.4 10*3/uL (ref 0.7–4.0)
Monocytes Absolute: 0.9 10*3/uL (ref 0.1–1.0)
Monocytes Absolute: 1.2 10*3/uL — ABNORMAL HIGH (ref 0.1–1.0)
Neutro Abs: 9.4 10*3/uL — ABNORMAL HIGH (ref 1.7–7.7)

## 2010-11-03 LAB — SALICYLATE LEVEL
Salicylate Lvl: <4 mg/dL (ref 2.8–20.0)
Salicylate Lvl: <4 mg/dL (ref 2.8–20.0)

## 2010-11-03 LAB — BASIC METABOLIC PANEL
Calcium: 8.9 mg/dL (ref 8.4–10.5)
GFR calc non Af Amer: >60 mL/min (ref >60)
Potassium: 3.7 mEq/L (ref 3.5–5.1)
Sodium: 140 mEq/L (ref 135–145)

## 2010-11-03 LAB — URINE MICROSCOPIC-ADD ON

## 2010-11-03 LAB — URINALYSIS, ROUTINE W REFLEX MICROSCOPIC
Bilirubin Urine: NEGATIVE
Glucose, UA: NEGATIVE mg/dL
Glucose, UA: NEGATIVE mg/dL
Hgb urine dipstick: NEGATIVE
Ketones, ur: NEGATIVE mg/dL
Ketones, ur: NEGATIVE mg/dL
Protein, ur: NEGATIVE mg/dL
Protein, ur: NEGATIVE mg/dL
Urobilinogen, UA: 1 mg/dL (ref 0.0–1.0)
Urobilinogen, UA: 0.2 mg/dL (ref 0.0–1.0)

## 2010-11-03 LAB — CBC
HCT: 37.2 % (ref 36.0–46.0)
HCT: 39.2 % (ref 36.0–46.0)
Hemoglobin: 12.7 g/dL (ref 12.0–15.0)
Hemoglobin: 13.2 g/dL (ref 12.0–15.0)
MCHC: 34 g/dL (ref 30.0–36.0)
Platelets: 206 10*3/uL (ref 150–400)
Platelets: 233 10*3/uL (ref 150–400)
RDW: 14 % (ref 11.5–15.5)
WBC: 14.3 10*3/uL — ABNORMAL HIGH (ref 4.0–10.5)

## 2010-11-03 LAB — WET PREP, GENITAL
Trich, Wet Prep: NONE SEEN
Yeast Wet Prep HPF POC: NONE SEEN

## 2010-11-03 LAB — HEPATIC FUNCTION PANEL
ALT: 9 U/L (ref 0–35)
Alkaline Phosphatase: 67 U/L (ref 39–117)
Bilirubin, Direct: 0.1 mg/dL (ref 0.0–0.3)
Indirect Bilirubin: 0.5 mg/dL (ref 0.3–0.9)

## 2010-11-03 LAB — ACETAMINOPHEN LEVEL
Acetaminophen (Tylenol), Serum: 15.1 ug/mL (ref 10–30)
Acetaminophen (Tylenol), Serum: <10 ug/mL — ABNORMAL LOW (ref 10–30)

## 2010-11-03 LAB — HCG, QUANTITATIVE, PREGNANCY: hCG, Beta Chain, Quant, S: <2 m[IU]/mL (ref <5)

## 2010-11-03 LAB — PREGNANCY, URINE: Preg Test, Ur: NEGATIVE

## 2010-11-03 LAB — GC/CHLAMYDIA PROBE AMP, GENITAL: Chlamydia, DNA Probe: NEGATIVE

## 2010-11-07 ENCOUNTER — Ambulatory Visit: Payer: Medicare Other

## 2010-11-09 ENCOUNTER — Ambulatory Visit: Payer: Medicare Other

## 2010-11-14 ENCOUNTER — Ambulatory Visit: Payer: Medicare Other

## 2010-11-15 ENCOUNTER — Ambulatory Visit: Payer: Medicare Other

## 2010-11-21 ENCOUNTER — Ambulatory Visit: Payer: Medicare Other

## 2010-11-24 ENCOUNTER — Ambulatory Visit: Payer: Medicare Other | Attending: Orthopedic Surgery

## 2010-11-24 DIAGNOSIS — IMO0001 Reserved for inherently not codable concepts without codable children: Secondary | ICD-10-CM | POA: Insufficient documentation

## 2010-11-24 DIAGNOSIS — R269 Unspecified abnormalities of gait and mobility: Secondary | ICD-10-CM | POA: Insufficient documentation

## 2010-11-24 DIAGNOSIS — M6281 Muscle weakness (generalized): Secondary | ICD-10-CM | POA: Insufficient documentation

## 2010-11-24 DIAGNOSIS — M25569 Pain in unspecified knee: Secondary | ICD-10-CM | POA: Insufficient documentation

## 2010-11-29 ENCOUNTER — Ambulatory Visit: Payer: Medicare Other

## 2010-11-30 ENCOUNTER — Ambulatory Visit: Payer: Medicare Other

## 2010-12-05 ENCOUNTER — Ambulatory Visit: Payer: Medicare Other

## 2010-12-06 NOTE — H&P (Signed)
[name], [name]                 ACCOUNT NO.:  [id number]   MEDICAL RECORD NO.:  [id number]          PATIENT TYPE:  INP   LOCATION:  5123                         FACILITY:  [facility]   PHYSICIAN:  Sharlet Salina T. Hoxworth, M.D.DATE OF BIRTH:  [birth year]   DATE OF ADMISSION:  11/17/2007  DATE OF DISCHARGE:  11/17/2007                              HISTORY & PHYSICAL   CHIEF COMPLAINT:  Right lower quadrant abdominal pain, nausea, vomiting.   HISTORY OF PRESENT ILLNESS:  [name] is an 20 year old female with a  3- to 4-week illness.  She initially presented with intermittent right  lower quadrant abdominal pain, nausea, vomiting, and some low right back  pain.  [facility].  Urinalysis  apparently showed UTI, I do not have those results.  She was treated  with a course of oral antibiotics for this.  She states she had no  improvement with this.  Her pain actually became gradually more frequent  and severe.  She re-presented to [facility] 3 days ago, and there was  concern clinically about pyelonephritis, and she was given a  prescription for oral Cipro.  She has been on this for 3 days but  presents to the [facility] Emergency Room today with worsening pain.  She describes now fairly constant right lower quadrant abdominal pain.  It is worse with any motion.  She has persistent nausea and vomiting and  has been vomiting in the emergency room.  Bowel movements have been  normal.  No fever or chills.  She has no chronic GI or abdominal  complaints or any history of any similar illness previously.  She  currently has no dysuria or frequency or hematuria.  She is on a normal  menstrual period without abnormal bleeding or discharge.   PAST MEDICAL HISTORY:  Surgery, none previously.  Medically, she has a  history of significant depression and alcoholism, currently in  Alcoholics Anonymous, and asthma.   CURRENT MEDICATIONS:  Albuterol inhaler and Cipro  prescription as above.   ALLERGIES:  MOTRIN and CODEINE.   SOCIAL HISTORY:  Currently, living with her parents, unemployed.  She  smokes a pack of cigarettes per day.  No alcohol in a number of months.  Denies drug use.   FAMILY HISTORY:  Noncontributory.   REVIEW OF SYSTEMS:  GENERAL:  No fever, chills.  HEENT:  No vision,  hearing, swallowing problems.  RESPIRATORY:  No recent shortness of  breath, cough, wheezing.  CARDIAC:  No chest pain, palpitations.  ABDOMEN/GI/GU:  As above.   PHYSICAL EXAM:  VITAL SIGNS:  Temperature is 97, pulse 81, respirations  20, blood pressure 101/72.  GENERAL:  She is a mildly obese young white female appears  uncomfortable.  SKIN:  There are multiple scars across her upper extremities, lower  extremities, and upper abdomen from self-injury in the past.  All  completely healed.  No rash or infection.  HEENT:  No palpable mass or thyromegaly.  Sclerae are nonicteric.  LUNGS:  Clear without wheezing or increased work of breathing.  CARDIAC:  Regular rate and rhythm.  No murmurs.  No edema.  ABDOMEN:  Bowel sounds are present.  There is well-localized right lower  quadrant tenderness with some guarding.  There is referred tenderness to  the right lower quadrant and the remainder the abdomen.  No discernible  masses or organomegaly.  PELVIC:  Performed by the ER physician reportedly negative, not  repeated.  EXTREMITIES:  No joint swelling or deformity.  NEUROLOGIC:  Alert, oriented.  Gait normal.   LABORATORY AND X-RAY:  White count is elevated at 15,000, hemoglobin 40.  Electrolytes normal.  UA negative.  Pregnancy negative.   CT scan of the abdomen and pelvis reviewed.  This shows an  appendicolith, but no obvious appendiceal inflammation.  No other  abnormalities.   ASSESSMENT/PLAN:  Persistent and worsening right lower quadrant  abdominal pain, nausea, and vomiting over 3 weeks.  She has elevated  white count and localized right lower  quadrant tenderness.  She has an  appendicolith, but no obvious appendiceal inflammation.  I am concerned  that she may have partially treated appendicitis on oral antibiotics.  She does have worsening presentation elevated white count, and I believe  the safest course would be to proceed with laparoscopic appendectomy.  This was discussed with the patient and her family, they are agreeable.  She is admitted for this procedure.      Lorne Skeens. Hoxworth, M.D.  Electronically Signed     BTH/MEDQ  D:  11/17/2007  T:  11/18/2007  Job:  [id number]

## 2010-12-06 NOTE — H&P (Signed)
[name], [name]                 ACCOUNT NO.:  [id number]   MEDICAL RECORD NO.:  [id number]          PATIENT TYPE:  IPS   LOCATION:  0302                          [facility]  [facility]   PHYSICIAN:  Jasmine Pang, M.D. DATE OF BIRTH:  1990-08-08   DATE OF ADMISSION:  12/24/2008  DATE OF DISCHARGE:                       PSYCHIATRIC ADMISSION ASSESSMENT   DATE OF ASSESSMENT:  December 24, 2008 and 9:30 a.m.   IDENTIFICATION:  A 20 year old female, single.  This is a voluntary  admission.   HISTORY OF THE PRESENT ILLNESS:  This is the sixth or so admission to  [facility] for this 20 year old with a history of  polysubstance abuse and mood disorder.  She reports relapsing on drugs  and having suicidal thoughts for the past couple of days.  She has a  history of prior suicide attempts by overdose more than once, and her  mobile crisis team member brought her to the emergency room.  She  reports that she has been doing crystal meth for the past 2 or 3 days,  heroin for 3-4 days and also taking some oral pain relievers including  Percocet and others when she could get her hands on them, having been  using drugs for possibly a week to a week and a half, although she was a  rather poor historian today.  Endorsing suicidal thoughts of possibly  overdosing or cutting herself.  She does endorse using IV heroin.  She  also has a history of self-mutilation with multiple scars on her arms  from previous self cutting.   PAST PSYCHIATRIC HISTORY:  First referred to [facility]  back in 2004 and had four admissions that year on the service of Dr.  Toniann Fail, M.D. and has been diagnosed in the past with mood  disorder, possibly bipolar disorder versus substance-induced mood  disorder, displayed poor impulse control and borderline personality  features.  She also has a history of polysubstance abuse for many years,  history of previous suicide attempts by overdose.  Most  recently was  here on the adult unit from October 12, 2008 to October 21, 2008 with some  psychomotor agitation.  Also abusing substances at that time.  Discharged on October 21, 2008 on no medications and referred to the  [facility] and the [facility].  Not clear if she has kept her  follow-up appointments.   SOCIAL HISTORY:  Single female who had apparently had some issues with  housing recently after being kicked out of her mother's house.  She  denies any current legal charges.  Home situation is unstable.   FAMILY HISTORY:  Not available.   MEDICAL HISTORY:  No regular primary care [name].  Medical problems  are obesity and asthma.   CURRENT MEDICATIONS:  1. Albuterol inhaler 2 puffs as needed for asthma.  2. Prozac 20 mg daily.  3. Lamictal 50 mg daily.  4. Acyclovir 400 mg twice a day.  5. BuSpar 15 mg oral as needed for anxiety.  (She reports last taking these medications 2 or 3 weeks ago.)   DRUG ALLERGIES:  None.   PHYSICAL EXAMINATION:  Physical exam was done in the emergency room as  noted in the record.  No evidence of current self-mutilation.  Does have  some old scarring from previous cutting.  She is in no physical  distress.  Full physical exam was done in the emergency room as noted in  the record.   DIAGNOSTIC STUDIES:  CBC essentially normal.  Alcohol level less than 5.  Electrolytes essentially normal.  BUN 8, creatinine 0.59.  Urine drug  screen positive for opiates, benzodiazepines and amphetamines.  Acetaminophen level was 18.1 and urine pregnancy test negative.   MENTAL STATUS EXAM:  This is a fully alert female who offers little,  keeps her eyes closed, turns her face into the bed.  She is under the  covers, resistant to interview.  Offers very little.  Some sketchy  information.  Says that she is here to be detoxed from opiates including  heroin which she relapsed on.  Endorses having suicidal thoughts.  A lot  of difficulties with housing.   Offers very little by way of history.  No  homicidal thoughts.  No agitation.  She is alert and oriented.   IMPRESSION:  AXIS I:  Mood disorder NOS.  Alcohol abuse rule out  dependence.  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  AXIS II:  Deferred.  AXIS III:  Asthma.  AXIS IV:  Deferred.  AXIS V:  Current 40; past year not known.   PLAN:  The plan is to voluntarily admit her to our dual diagnosis unit.  We started her on clonidine detox protocol.  She will receive regular  doses of clonidine based on her blood pressure reading and regular COWS  scores and other medications for symptom management.  She is on  trazodone 50 mg h.s. p.r.n. insomnia, and we will not restart any of our  other previous medications until we can get some additional history.      Margaret A. Lorin Picket, N.P.      Jasmine Pang, M.D.  Electronically Signed    MAS/MEDQ  D:  12/24/2008  T:  12/24/2008  Job:  [id number]

## 2010-12-06 NOTE — Op Note (Signed)
NAMESUNJAI, LEVANDOSKI                 ACCOUNT NO.:  [id number]   MEDICAL RECORD NO.:  [id number]          PATIENT TYPE:  INP   LOCATION:  5123                         FACILITY:  [facility]   PHYSICIAN:  Sharlet Salina T. Hoxworth, M.D.DATE OF BIRTH:  03-03-1991   DATE OF PROCEDURE:  11/17/2007  DATE OF DISCHARGE:                               OPERATIVE REPORT   PREOPERATIVE DIAGNOSIS:  Possible appendicitis.   POSTOPERATIVE DIAGNOSIS:  Possible appendicitis.   SURGICAL PROCEDURE:  Laparoscopic appendectomy.   SURGEON:  Lorne Skeens. Hoxworth, MD   ANESTHESIA:  General.   BRIEF HISTORY:  Stephonie Wilcoxen is an 20 year old female with a 3-week  illness consisting chiefly of nausea, vomiting, and right lower quadrant  abdominal pain.  She initially was found to have an [facility] and was treated with a course of  antibiotics, but did not improve with intermittent right lower quadrant  pain, nausea, and vomiting.  She subsequently was seen back and started  on Cipro 3 days ago, but has had worsening, now persistent right lower  quadrant pain and nausea and presented to the [facility] Emergency Room today.  Exam shows localized tenderness in the right lower quadrant and white  count is elevated at 15,000.  CT scan of the abdomen and pelvis shows an  appendicolith, but no obvious appendiceal inflammation or other etiology  for pain.  With her worsening clinical course, tenderness, elevated  white count, and appendicolith, there was concern that she has a  partially treated appendicitis, and I have recommended proceeding with  laparoscopic appendectomy.  We will plan to carefully examine the  remainder of the abdomen as well.  The nature of the procedure, its  indications, risks of negative appendectomy, bleeding, infection, and  possible need for procedure have been discussed with the patient.  She  is now brought to operating room for this procedure.   DESCRIPTION OF  OPERATION:  The patient was brought to the operating  room, placed in supine position on the operating table, and general  endotracheal anesthesia was induced.  Foley catheter was placed.  The  abdomen was widely, sterilely prepped and draped.  She received  preoperative antibiotics.  TS were in place.  Correct patient and  procedure were verified.  A 1-cm incision was made at the umbilicus and  dissection carried down the midline fascia which was incised at 1 cm.  The peritoneum entered under direct vision.  Through a mattress suture  of 0-Vicryl, the Hasson trocar was placed and pneumoperitoneum  established.  Under direct vision, a 5-mm trocar was placed in the right  upper quadrant and a 12-mm trocar in the left lower quadrant.  The  appendix was visualized lying lateral to the cecum and it appeared  possibly somewhat thickened, though not obviously inflamed and certainly  no purulence.  There was no fluid in the abdominal cavity.  Tubes and  ovaries appeared normal.  The right colon, cecum, and small bowel were  carefully examined and all appeared normal.  The gallbladder was normal.  I then  proceeded with laparoscopic appendectomy as planned.  The  appendix was mobilized dividing lateral peritoneal attachments with the  harmonic scalpel.  The mesoappendix was then sequentially divided with  harmonic scalpel until the appendix was completely freed down to the tip  of the cecum.  The appendix was divided from the tip of the cecum with a  single firing of the vascular load 35-mm Endo-GIA.  The staple line was  intact and without bleeding.  The appendix was placed in EndoCatch bag  and brought out through the umbilicus.  The abdomen was irrigated and  complete  hemostasis was assured.  Trocars removed and all CO2 evacuated.  The  mattress sutures was secured at the umbilicus.  Skin incisions were  closed with interrupted subcuticular Monocryl and Dermabond.  The  patient was taken to  recovery in good condition.      Lorne Skeens. Hoxworth, M.D.  Electronically Signed     BTH/MEDQ  D:  11/17/2007  T:  11/18/2007  Job:  [id number]

## 2010-12-06 NOTE — Discharge Summary (Signed)
[name], [name]                 ACCOUNT NO.:  [id number]   MEDICAL RECORD NO.:  [id number]          PATIENT TYPE:  IPS   LOCATION:  0407                          [facility]ILITY[facility]  [facility]   PHYSICIAN:  Anselm Jungling, MD  DATE OF BIRTH:  February 09, 1991   DATE OF ADMISSION:  10/12/2008  DATE OF DISCHARGE:  10/21/2008                               DISCHARGE SUMMARY   IDENTIFYING DATA AND REASON FOR ADMISSION:  This was an inpatient  psychiatric admission for [name], a 20 year old single female, admitted  due to substance abuse and acting out and self-injurious behaviors.  Please refer to the admission note for further details pertaining to the  symptoms, circumstances and history that led to her hospitalization.  She was given an initial Axis I diagnosis of polysubstance abuse, with  an Axis II designation of borderline personality traits.   MEDICAL AND LABORATORY:  The patient was medically and physically  assessed by the psychiatric nurse practitioner.  She had a history of  self-inflicted lacerations.  There were no significant medical issues  during her stay.  She had some cuts in various stages of healing with  sutures to the left thigh.   HOSPITAL COURSE:  The patient was admitted to the adult inpatient  psychiatric service.  She presented as an obese, normally-developed,  young woman who initially was minimally responsive, irritable, and  denied any specific problems or complaints.  She was later able to  acknowledge that her substance abuse was out of control, having been  using alcohol, Vicodin, Klonopin, and Xanax.  She indicated that she  felt that her current situation in her family home was not supportive of  ongoing sobriety and recovery, as family members actively supply her  with substances for her to use.  As such, she was interested in working  with this to find a placement situation that would support her sobriety  efforts and provide her some structure and safety.   She  was treated with a detoxification protocol, and this portion of her  treatment proceeded uneventfully.  She participated in various  therapeutic groups and activities including those geared towards 12-step  recovery.  After the first couple of days, in which she continued surly,  irritable, and unable to acknowledge responsibility for any of her  problems or recovery from them, she began to become more compliant, open  minded, and insightful.   This young woman has previously been diagnosed in many other treatment  situations, and by many other previous psychiatrist, as having bipolar  disorder.  It was discussed with the patient that there was possibility  that she had indeed does not have a bipolar disorder, but that her  history of mood-related difficulties were the product of family  dysfunction, substance abuse, and turbulent adolescence.   We did not see any indication for any psychotropic medication other than  those utilized to affect her detoxification.  By the end of her stay,  which was 10 days in length, the patient was pleasant, calm,  nonirritable, and much more optimistic.  She agreed to the following  aftercare plan.   AFTERCARE:  The patient was to follow-up at the [facility] with an  appointment on April 1 at 3:00 p.m.   She was referred to an [facility] for women.   DISCHARGE MEDICATIONS:  None.   DISCHARGE DIAGNOSES:  AXIS I:  Polysubstance dependence, early  remission, history of bipolar disorder - diagnosis in doubt.  AXIS II:  Deferred.  AXIS III:  History of self-inflicted cuts.  AXIS IV:  Stressors severe.  AXIS V:  GAF on discharge 55.      Anselm Jungling, MD  Electronically Signed     SPB/MEDQ  D:  10/21/2008  T:  10/21/2008  Job:  [id number]

## 2010-12-06 NOTE — H&P (Signed)
[name], [name]                 ACCOUNT NO.:  [id number]   MEDICAL RECORD NO.:  [id number]          PATIENT TYPE:  IPS   LOCATION:  0407                          [facility]ILITY[facility]  [facility]   PHYSICIAN:  Anselm Jungling, MD  DATE OF BIRTH:  03-16-1991   DATE OF ADMISSION:  10/12/2008  DATE OF DISCHARGE:                       PSYCHIATRIC ADMISSION ASSESSMENT   HISTORY OF PRESENT ILLNESS:  This is a 20 year old female involuntarily  petitioned on October 12, 2008.  The petition papers state the patient has  severe self-destructive acting out behaviors and a history of substance  abuse.  The patient has a long history of self-injurious behaviors,  cutting on herself.  She has a history of substance use.  Apparently the  patient was initially seen in the emergency room with complaints of leg  pain, stating that she was kicked out of the house, presented with self-  inflicted injuries with a razor.  Also noted the patient may have been  drinking and had taken some Vicodin, Klonopin, and Topamax.  She was  discharged from the emergency room with the laceration sutured and was  discharged with Xanax.  The patient later that day had presented to our  behavioral health center, stating that she had taken her Xanax trying to  get high.  She was provided some unreliable information and was sent  back to the emergency room for possible admission.   PAST PSYCHIATRIC HISTORY:  This is the patient's first admission to the  adult unit.  The patient apparently has had an admission approximately  six years ago at the age of 41.  No current outpatient mental health  treatment that we are aware of.   SOCIAL HISTORY:  She is a 20 year old female, living situation is  unclear.   FAMILY HISTORY:  Family history is none that we are aware of.   ALCOHOL/DRUG HISTORY:  There appears to be some history of alcohol and  benzodiazepine use.   PRIMARY CARE [name]:  Primary care [name] is unknown.   MEDICAL  PROBLEMS:  No known medical problems.  The patient does present  with cuts in various stages of healing with sutures to the left thigh.   MEDICATIONS:  Medications listed as albuterol.   DRUG ALLERGIES:  MOTRIN AND CODEINE.   PHYSICAL EXAM:  This is an obese young female.  She was charcoaled in  the emergency department, cursing, agitated, argumentative, and hostile.  Again, the patient presents with various stages of healing from her self-  inflicted injuries.  Again, sutures are in place to her left thigh and  they are intact and do not show any signs of infection.  Her temperature  is 97.1, heart rate 81, respirations 20, blood pressure is 114/81, 5  feet 9 inches tall, 204 pounds.   LABORATORY DATA:  Laboratory data shows a potassium of 3.3, WBC count is  12.4.  Urine drug screen is positive for benzodiazepines, positive for  THC.  Urinalysis shows few bacteria.  Acetaminophen level of 15.1.  Salicylate less than 4.   MENTAL STATUS EXAM:  The patient is resting in the  bed at this time.  Appears very sleepy.  Opens her eyes momentarily when her name is  called.  Asked what time it was.  Offers no other information to  question that are asked.  Denies any specific complaints at this time.   DIAGNOSES:  Axis I:  Polysubstance abuse.  Axis II:  Borderline personality.  Axis III:  No known medical conditions.  Axis IV:  Other psychosocial problems.  Axis V:  Current is 30.   PLAN:  Our plan is to monitor wounds for signs and symptoms of  infection.  Will work on the patient's coping skills.  The patient may  benefit from some individual therapy.  Will continue to assess  comorbidities.  Will offer a family session with her support group.  Case manager will identify her living situation.  Her tentative length  of stay at this time is 3-4 days.      Landry Corporal, N.P.      Anselm Jungling, MD  Electronically Signed    JO/MEDQ  D:  10/13/2008  T:  10/13/2008  Job:   [id number]

## 2010-12-09 NOTE — Discharge Summary (Signed)
NAME:  [name], [name]                           ACCOUNT NO.:  [id number]   MEDICAL RECORD NO.:  [id number]                   PATIENT TYPE:  INP   LOCATION:  0105                                 [facility]ILITY:  [facility]   PHYSICIAN:  Cindie Crumbly, M.D.               DATE OF BIRTH:  06/29/1991   DATE OF ADMISSION:  12/10/2002  DATE OF DISCHARGE:  12/16/2002                                 DISCHARGE SUMMARY   REASON FOR ADMISSION:  This 20 year old white female was involuntarily  admitted complaining of depression and suicidal ideation with a plan to cut  her wrists and her throat.  For further history of present illness, please  see patient's psychiatric admission assessment.   PHYSICAL EXAMINATION:  GENERAL:  Her physical examination at the time of  admission was significant for obesity.  She had an otherwise unremarkable  physical examination.   LABORATORY DATA:  The patient underwent a laboratory evaluation to rule out  any other medical problems contributing to her symptomatology.  A CBC showed  an MCHC     of 34.5 and was otherwise unremarkable.  Hepatic panel was  within normal limits.  Urine drug screen was positive for metabolites of  marijuana.  A UA was unremarkable.  A basic metabolic profile showed a  potassium of 3.3 and was otherwise unremarkable.  Free T4 and TSH are  pending at the time of discharge.  RPR was nonreactive.  Urine probe for  gonorrhea and Chlamydia is pending at the time of discharge.  Urine  pregnancy test was negative.  Valproic acid level on admission was 43.9, on  discharge was 129.9, which was within the therapeutic range after being  titrated upward to a therapeutic dose; she denies any side effects from the  Depakote at the present time.  The patient received no x-rays, no special  procedures, no additional consultations.  She sustained no complications  during the course of this hospitalization.   HOSPITAL COURSE:  On admission, the patient was  psychomotor agitated,  oppositional, and defiant.  Her affect and mood were depressed, irritable,  angry, grandiose, and labile.  She showed decreased concentration and  attention span and poor impulse control.  She was easily distracted by  extraneous stimuli.  As noted above, she was titrated upward on Depakote to  a therapeutic dose, and at the time of discharge, she denies any homicidal  or suicidal ideation.  Her affect and mood are improved.  Her concentration  is increased.  She is actively participating in all aspects of the  therapeutic treatment program, no longer appears to be a danger to herself  or others.  She has displayed no evidence of a thought disorder throughout  her hospital course.   CONDITION ON DISCHARGE:  Improved.   DISCHARGE DIAGNOSES:  Diagnoses according to DSM-IV:   AXIS I:  1. Bipolar disorder, mixed-type, severe, without psychosis.  2. Conduct disorder.  3. Polysubstance dependence.  4. Rule out substance-induced mood disorder.  5. Attention-deficit hyperactivity disorder, combined-type.   AXIS II:  1. Rule out personality disorder, not otherwise specified.  2. Rule out learning disorder, not otherwise specified.   AXIS III:  Obesity.   AXIS IV:  Severe.   AXIS V:  1. Code 20 on admission.  2. Code 30 on discharge.   FURTHER EVALUATION AND TREATMENT RECOMMENDATIONS:  1. The patient is discharged to home.  2. She is discharged on an unrestricted level of activity and a regular     diet.  3. She will follow up with her outpatient psychiatrist for all further     aspects of her psychiatric care, and consequently, I will sign off on the     case at this time.  4. She will follow up with her primary care physician for all further     aspects of her medical care.                                               Cindie Crumbly, M.D.    TS/MEDQ  D:  12/16/2002  T:  12/16/2002  Job:  [id number]
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
## 2010-12-09 NOTE — Discharge Summary (Signed)
[facility]  Patient:    [name], [name] Visit Number: [id number] MRN: [id number]          Service Type: PSY Location: 600 [id number] 01 Attending Physician:  Veneta Penton. Dictated by:   Veneta Penton, M.D. Admit Date:  06/25/2001 Discharge Date: 07/02/2001                             Discharge Summary  REASON FOR ADMISSION:  This 20 year old white female was admitted complaining of depression with suicidal ideation with a plan to cut her wrists with a knife.  She had also been stockpiling pills with a plan to overdose on these when she successfully accumulated enough of them.  For further history of present illness, please see the patients psychiatric admission assessment.  PHYSICAL EXAMINATION AT THE TIME OF ADMISSION:  Significant for self-inflicted superficial laceration of the left forearm as well as for the patient being overweight.  She had an otherwise unremarkable physical examination.  LABORATORY EXAMINATION:  The patient underwent a laboratory workup to rule out any medical problems contributing to her symptomatology.  Urine drug screen was positive for amphetamines and consistent with the patient taking Adderall XR.  UA was unremarkable.  Free T4 was 0.79, TSH was 1.186.  GGT was within normal limits.  CBC showed MCHC 34.2 and was otherwise unremarkable.  Routine metabolic panel was within normal limits.  Hepatic panel was within normal limits.  The patient received no x-rays, no special procedures, no additional consultations.  She sustained no complications during the course of this hospitalization.  HOSPITAL COURSE:  On admission, the patients affect and mood were depressed, anxious, and irritable.  She displayed poor impulse control, decreased concentration and attention span, and she was easily distracted by extraneous stimuli.  She rapidly adapted to unit routine, socializing well with both patients and staff.  She was  continued on Adderall XR and Paxil CR.  Because of significant problems with impulse control, clonidine was added and titrated up to a therapeutic dose.  At the time of discharge she denies any homicidal or suicidal ideation.  Her affect and mood have improved.  She is participating in all aspects of the therapeutic treatment program.  She no longer appears to be a danger to herself or other.  Consequently, it is felt she has reached her maximum benefits of hospitalization and is ready for discharge to a less restrictive alternative setting.  CONDITION ON DISCHARGE:  Improved.  DIAGNOSES: Axis I:    1. Major depression, single episode, severe without psychosis.            2. Attention-deficit/hyperactivity disorder, combined type.            3. Rule out eating disorder, not otherwise specified.            4. Polysubstance abuse. Axis II:   Rule out learning disorder, not otherwise specified. Axis III:  Obesity. Axis IV:   Current psychosocial stressors are severe. Axis V:    20 on admission, 30 on discharge.  FURTHER EVALUATION AND TREATMENT RECOMMENDATIONS: 1. The patient is discharged to home. 2. She is discharged on an unrestricted level of activity and a regular diet. 3. She will follow up with Dr. Betti Cruz, her outpatient psychiatrist for all    further aspects of her psychiatric care and consequently, I will sign off    on the case at this time.  DISCHARGE MEDICATIONS: 1.  Clonidine 0.05 mg each morning, 4 p.m., and at bedtime. 2. Paxil CR 12.5 mg each morning. 3. Adderall XR 20 mg p.o. each morning. Dictated by:   Veneta Penton, M.D. Attending Physician:  Veneta Penton DD:  07/02/01 TD:  07/02/01 Job: [id number]
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
## 2010-12-09 NOTE — Discharge Summary (Signed)
NAME:  [name], STCHARLES                           ACCOUNT NO.:  [id number]   MEDICAL RECORD NO.:  [id number]                   PATIENT TYPE:  INP   LOCATION:  0101                                 [facility]ILITY:  [facility]   PHYSICIAN:  Cindie Crumbly, M.D.               DATE OF BIRTH:  [birth year]   DATE OF ADMISSION:  09/15/2002  DATE OF DISCHARGE:  09/19/2002                                 DISCHARGE SUMMARY   REASON FOR ADMISSION:  This is a 20 year old white female who was admitted  complaining of rapid cycling bipolar disorder with increasing symptoms of  depression. She had overdosed on a combination of Flexeril and ibuprofen on  the school bus as a suicide attempt. For further history of present illness  please see the patient's psychiatric admission assessment.   PHYSICAL EXAMINATION:  At the time of admission was significant for obesity  and was otherwise unremarkable.   LABORATORY DATA:  The patient underwent a laboratory workup to rule out any  medical problems contributing to her symptomology. A urine probe for  gonorrhea and Chlamydia were negative.  A CBC showed an MCHC of 34.5 and was  otherwise unremarkable. Free T4 and TSH were within normal limits. RPR  nonreactive. GGT was within normal limits. Hepatic panel was unremarkable. A  valproic acid  level at steady state was 70.3.   The patient received no x-rays, no special procedures, no additional  consultations. She sustained no complications during the course of this  hospitalization.   HOSPITAL COURSE:  On admission the patient was  psychomotor agitated and was  significant for grandiose delusions and had decreased concentration, poor  impulse control. Her speech was pressured with flight of ideas. She was  easily distracted by extraneous stimuli. Her affect and mood were elevated,  expansive, labile, depressed, irritable and angry. She was oppositional,  defiant, hostile, gamy and manipulative.   She was restarted on a  trial of Depakote ER and was titrated up to a  therapeutic dose. She was discontinued from Prozac because of fear that this  might be contributing to her bipolar symptoms. She was begun on Seroquel for  sleep and tolerated this medication well without side effects.   At the time of discharge her psychotic symptoms have resolved. Her affect  and mood have improved. She is easily redirected in the milieu and actively  participating in all aspects of therapeutic treatment program. She is  motivated for outpatient therapy and consequently it is felt she has reached  her maximum benefits of hospitalization and is ready for discharge to a less  restrictive alternative setting.   CONDITION ON DISCHARGE:  Improved.   DIAGNOSES:  According to DSM4:   AXIS I:  1. Bipolar disorder, mixed type, severe with mood congruent psychosis.  2. Conduct disorder.  3. Rule out oppositional defiant disorder.  4. Attention deficit hyperactivity disorder combined type.  5.  Polysubstance dependence.  6. Rule out  substance induced mood disorder.   AXIS II:  1. Rule out personality disorder not otherwise specified.  2. Rule out learning disorder not otherwise specified.   AXIS III:  Obesity.   AXIS IV:  Severe.   AXIS V:  Code 20 on admission, code 30 on discharge.   FURTHER EVALUATION AND TREATMENT RECOMMENDATIONS:  The patient is discharged  to home. She is discharged on an unrestricted level of activity and a  regular diet.   DISCHARGE MEDICATIONS:  1. Seroquel 100 mg p.o. q.h.s.  2. Depakote ER 1000 mg p.o. q.h.s.   FOLLOW UP:  She will follow up with Dr. Wynonia Lawman, her outpatient psychiatrist,  for all further aspects of her psychiatric care and her primary care  physician for all further aspects of her medical care. Consequently I will  sign off on the case at this time.                                               Cindie Crumbly, M.D.    TS/MEDQ  D:  09/19/2002  T:  09/19/2002  Job:   [id number]
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
## 2010-12-09 NOTE — Cardiovascular Report (Signed)
[facility]  Patient:    STEPHENIE, [name] Visit Number: [id number] MRN: [id number]          Service Type: PSY Location: [id number] [id number] Attending Physician:  Veneta Penton. Dictated by:   Veneta Penton, M.D. Proc. Date: 06/25/01 Admit Date:  06/25/2001                          Cardiac Catheterization  DATE OF ADMISSION: June 25, 2001.  REASON FOR ADMISSION: This 20 year old, white female was admitted complaining of depression with suicidal ideation with a plan to cut her wrist with a knife. She had also been stockpiling pills so that she could overdose successfully. She admitted to vague homicidal ideation toward her teachers, peers, and siblings.  HISTORY OF PRESENT ILLNESS: The patient complains of increasingly depressed, irritable and angry mood most of the day nearly every day over the past year, that has been increasingly severely over the past several months.  She admit to anhedonia, decreased school performance. She has been increasingly isolative and withdrawn with decreased concentration and energy level, increased symptoms of fatigue, feelings of hopelessness, helplessness, worthlessness, excessive, and inappropriate guilt, insomnia, night mares, and she has been purging after meals. She reports that her current psychosocial stressors include the fact that a friend of hers was killed one year ago in a motor vehicle accident and the patient had asked her to come over to have a play date. Apparently, the patient feels that she is the cause of her friends death.  PAST PSYCHIATRIC HISTORY: Significant for attention deficit hyperactivity disorder. She has been seen in outpatient treatment at [facility].  DRUG AND ALCOHOL ABUSE HISTORY: Significant for the patient reportedly using alcohol intermittently over the past 1-2 years. She states she had 3 or 4 beers two weeks ago. She reports smoking  cigarettes, less than a half a pack a day. She admits to having tried cannabis once in the past.  PAST MEDICAL HISTORY: Significant for obesity and self-inflicted superficial lacerations in the left forearm.  ALLERGIES: She has no known drug allergies or sensitivities.  CURRENT MEDICATIONS: 1. Adderall XR 20 mg p.o. q.d. 2. Paxil CR 12.5 mg p.o. q.a.m. She was started on Paxil approximately    one week ago.  FAMILY AND SOCIAL HISTORY: The patient lives with her mother and father and 50 and 10 year old brothers. Father has a history of major depression and alcoholism. Mother has a history of alcoholism. Maternal grandmother has a history of an anxiety disorder. Paternal grandfather has a history of anxiety disorder. The patient is currently in the 7th grade. She is failing all of her classes with the exception of language, arts, and chorus.  STRENGTHS AND ASSETS: Her family are very supportive of her.  MENTAL STATUS EXAMINATION: The patient presents as a well-developed, well-nourished, pubescent white female, who is alert and oriented x4, cooperative with the evaluation and whose appearance is compatible with her stated age. Her speech is coherent with decreased rate in volume and speech, increased speech latency. She displays no looseness of association, phonemic errors, or evidence of a thought disorder.  Her affect and mood are depressed, anxious, and irritable. Her concentration and attention span are decreased. She is easily distracted by extraneous stimuli. She displays poor impulse control. Her immediate recall, short-term memory and remote memory are intact. Similarities and differences are within normal limits. Her proverbs are concrete and consistent with her  developmental level. Her thought processes are goal directed.  ADMISSION DIAGNOSES: Her diagnoses according to DSM-IV: Axis I:    1. Major depression, single episode, severe without               psychosis.             2. Attention deficit hyperactivity disorder, combined type.            3. Rule out eating disorder, not otherwise specified.            4. Polysubstance abuse.            5. Rule out polysubstance dependence. Axis II:   Rule out learning disorder, not otherwise specified. Axis III:  Obesity. Axis IV:   Severe. Axis V:    Code 20.  ESTIMATED LENGTH OF STAY: The estimated length of stay for the patient on the inpatient unit is 5-7 days.  INITIALLY DISCHARGE PLAN: To discharge the patient to home.  INITIAL PLAN OF CARE: The initial plan of care is to keep the patient on Adderall XR and Paxil CR and add clonidine for impulse control. A laboratory work-up will also be initiated to rule out any other medical problems contributing to her symptomatology. Dictated by:   Veneta Penton, M.D. Attending Physician:  Veneta Penton DD:  06/26/01 TD:  06/26/01 Job: [id number]
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
## 2010-12-13 ENCOUNTER — Ambulatory Visit: Payer: Medicare Other

## 2010-12-14 ENCOUNTER — Ambulatory Visit: Payer: Medicare Other

## 2010-12-21 ENCOUNTER — Ambulatory Visit: Payer: Medicare Other | Admitting: Physical Therapy

## 2010-12-23 ENCOUNTER — Ambulatory Visit: Payer: Medicare Other | Attending: Orthopedic Surgery | Admitting: Physical Therapy

## 2010-12-23 DIAGNOSIS — R262 Difficulty in walking, not elsewhere classified: Secondary | ICD-10-CM | POA: Insufficient documentation

## 2010-12-23 DIAGNOSIS — M25569 Pain in unspecified knee: Secondary | ICD-10-CM | POA: Insufficient documentation

## 2010-12-23 DIAGNOSIS — IMO0001 Reserved for inherently not codable concepts without codable children: Secondary | ICD-10-CM | POA: Insufficient documentation

## 2010-12-23 DIAGNOSIS — R609 Edema, unspecified: Secondary | ICD-10-CM | POA: Insufficient documentation

## 2010-12-23 DIAGNOSIS — M25669 Stiffness of unspecified knee, not elsewhere classified: Secondary | ICD-10-CM | POA: Insufficient documentation

## 2010-12-27 ENCOUNTER — Ambulatory Visit: Payer: Medicare Other | Admitting: Physical Therapy

## 2010-12-28 ENCOUNTER — Encounter: Payer: Medicare Other | Admitting: Physical Therapy

## 2010-12-30 ENCOUNTER — Ambulatory Visit: Payer: Medicare Other

## 2011-01-02 ENCOUNTER — Ambulatory Visit: Payer: Medicare Other

## 2011-01-03 ENCOUNTER — Ambulatory Visit: Payer: Medicare Other

## 2011-01-09 ENCOUNTER — Ambulatory Visit: Payer: Medicare Other

## 2011-01-11 ENCOUNTER — Ambulatory Visit: Payer: Medicare Other | Admitting: Physical Therapy

## 2011-01-12 ENCOUNTER — Ambulatory Visit: Payer: Medicare Other

## 2011-01-16 ENCOUNTER — Ambulatory Visit: Payer: Medicare Other

## 2011-01-17 ENCOUNTER — Ambulatory Visit: Payer: Medicare Other

## 2011-01-19 ENCOUNTER — Ambulatory Visit: Payer: Medicare Other

## 2011-01-23 ENCOUNTER — Ambulatory Visit: Payer: Medicare Other

## 2011-02-06 ENCOUNTER — Ambulatory Visit: Payer: Medicare Other | Attending: Orthopedic Surgery

## 2011-02-06 DIAGNOSIS — M25569 Pain in unspecified knee: Secondary | ICD-10-CM | POA: Insufficient documentation

## 2011-02-06 DIAGNOSIS — R269 Unspecified abnormalities of gait and mobility: Secondary | ICD-10-CM | POA: Insufficient documentation

## 2011-02-06 DIAGNOSIS — IMO0001 Reserved for inherently not codable concepts without codable children: Secondary | ICD-10-CM | POA: Insufficient documentation

## 2011-02-06 DIAGNOSIS — M6281 Muscle weakness (generalized): Secondary | ICD-10-CM | POA: Insufficient documentation

## 2011-02-09 ENCOUNTER — Ambulatory Visit: Payer: Medicare Other

## 2011-02-11 ENCOUNTER — Emergency Department (HOSPITAL_COMMUNITY)
Admission: EM | Admit: 2011-02-11 | Discharge: 2011-02-11 | Disposition: A | Discharge status: Home / Self Care | Payer: Medicare Other | Attending: Emergency Medicine | Admitting: Emergency Medicine

## 2011-02-11 ENCOUNTER — Emergency Department (HOSPITAL_COMMUNITY): Payer: Medicare Other

## 2011-02-11 DIAGNOSIS — R296 Repeated falls: Secondary | ICD-10-CM | POA: Insufficient documentation

## 2011-02-11 DIAGNOSIS — X500XXA Overexertion from strenuous movement or load, initial encounter: Secondary | ICD-10-CM | POA: Insufficient documentation

## 2011-02-11 DIAGNOSIS — IMO0002 Reserved for concepts with insufficient information to code with codable children: Secondary | ICD-10-CM | POA: Insufficient documentation

## 2011-02-11 DIAGNOSIS — M25569 Pain in unspecified knee: Secondary | ICD-10-CM | POA: Insufficient documentation

## 2011-02-11 DIAGNOSIS — J45909 Unspecified asthma, uncomplicated: Secondary | ICD-10-CM | POA: Insufficient documentation

## 2011-04-17 LAB — RAPID URINE DRUG SCREEN, HOSP PERFORMED
Amphetamines: NOT DETECTED
Benzodiazepines: POSITIVE — AB
Cocaine: NOT DETECTED
Tetrahydrocannabinol: NOT DETECTED

## 2011-04-18 LAB — DIFFERENTIAL
Basophils Absolute: 0
Eosinophils Relative: 1
Lymphocytes Relative: 29
Lymphs Abs: 4.3 — ABNORMAL HIGH
Neutrophils Relative %: 63

## 2011-04-18 LAB — POCT URINALYSIS DIP (DEVICE)
Bilirubin Urine: NEGATIVE
Nitrite: POSITIVE — AB
Protein, ur: 100 — AB
Urobilinogen, UA: 0.2
pH: 6

## 2011-04-18 LAB — URINALYSIS, ROUTINE W REFLEX MICROSCOPIC
Glucose, UA: NEGATIVE
Ketones, ur: NEGATIVE
Nitrite: NEGATIVE
Protein, ur: NEGATIVE
pH: 5.5

## 2011-04-18 LAB — CBC
HCT: 39.9
Hemoglobin: 12.5
MCHC: 34.3
Platelets: 311
RBC: 4.44
RDW: 14.7
RDW: 14.9
WBC: 15 — ABNORMAL HIGH

## 2011-04-18 LAB — BASIC METABOLIC PANEL
BUN: 9
Calcium: 9.2
Creatinine, Ser: 0.66
GFR calc non Af Amer: >60
Glucose, Bld: 89

## 2011-04-18 LAB — POCT PREGNANCY, URINE
Operator id: 239701
Preg Test, Ur: NEGATIVE

## 2011-04-18 LAB — PREGNANCY, URINE: Preg Test, Ur: NEGATIVE

## 2011-04-19 LAB — CBC
HCT: 39.8
HCT: 39.2
Hemoglobin: 13.4
Hemoglobin: 13.5
MCHC: 33.8
MCV: 82.2
MCV: 82.3
Platelets: 248
Platelets: 267
RBC: 4.84
RBC: 4.94
RDW: 13.9
RDW: 14.3
WBC: 14.7 — ABNORMAL HIGH
WBC: 13.5 — ABNORMAL HIGH

## 2011-04-19 LAB — COMPREHENSIVE METABOLIC PANEL WITH GFR
ALT: 12
AST: 23
Albumin: 3.8
Alkaline Phosphatase: 67
GFR calc Af Amer: >60
Glucose, Bld: 85
Potassium: 3.7
Sodium: 141
Total Protein: 6.7

## 2011-04-19 LAB — DIFFERENTIAL
Basophils Absolute: 0.1
Basophils Absolute: 0
Basophils Absolute: 0
Basophils Relative: 1
Eosinophils Absolute: 0.1
Eosinophils Absolute: 0.1
Eosinophils Absolute: 0.1
Eosinophils Relative: 1
Eosinophils Relative: 1
Eosinophils Relative: 1
Lymphocytes Relative: 21
Lymphocytes Relative: 27
Lymphs Abs: 3.1
Lymphs Abs: 2.7
Monocytes Absolute: 1.1 — ABNORMAL HIGH
Monocytes Absolute: 1
Monocytes Absolute: 1.2 — ABNORMAL HIGH
Monocytes Relative: 8
Neutro Abs: 10.2 — ABNORMAL HIGH
Neutrophils Relative %: 70

## 2011-04-19 LAB — URINE CULTURE: Colony Count: 15000

## 2011-04-19 LAB — KETONES, QUALITATIVE: Acetone, Bld: NEGATIVE

## 2011-04-19 LAB — COMPREHENSIVE METABOLIC PANEL
ALT: 9
AST: 19
Albumin: 4.1
BUN: 9
CO2: 21
CO2: 26
Calcium: 8.8
Chloride: 109
Chloride: 105
Creatinine, Ser: 0.55
GFR calc Af Amer: >60
GFR calc non Af Amer: >60
GFR calc non Af Amer: >60
Sodium: 141
Total Bilirubin: 0.3
Total Bilirubin: 0.5

## 2011-04-19 LAB — RAPID URINE DRUG SCREEN, HOSP PERFORMED
Amphetamines: NOT DETECTED
Barbiturates: NOT DETECTED
Benzodiazepines: NOT DETECTED

## 2011-04-19 LAB — WET PREP, GENITAL: Yeast Wet Prep HPF POC: NONE SEEN

## 2011-04-19 LAB — URINALYSIS, ROUTINE W REFLEX MICROSCOPIC
Nitrite: NEGATIVE
Protein, ur: NEGATIVE
Specific Gravity, Urine: 1.013
Urobilinogen, UA: 0.2

## 2011-04-19 LAB — ETHANOL: Alcohol, Ethyl (B): 214 — ABNORMAL HIGH

## 2011-04-19 LAB — ACETAMINOPHEN LEVEL
Acetaminophen (Tylenol), Serum: 16.6
Acetaminophen (Tylenol), Serum: 32.7 — ABNORMAL HIGH

## 2011-04-19 LAB — POCT I-STAT, CHEM 8
BUN: 6
Calcium, Ion: 0.89 — ABNORMAL LOW
Glucose, Bld: 117 — ABNORMAL HIGH
TCO2: 13

## 2011-04-19 LAB — PROTIME-INR
INR: 1
Prothrombin Time: 13.8

## 2011-04-19 LAB — GC/CHLAMYDIA PROBE AMP, GENITAL
Chlamydia, DNA Probe: NEGATIVE
GC Probe Amp, Genital: NEGATIVE

## 2011-04-19 LAB — OSMOLALITY: Osmolality: 285

## 2011-04-24 ENCOUNTER — Emergency Department (HOSPITAL_COMMUNITY): Payer: Medicare Other

## 2011-04-24 ENCOUNTER — Emergency Department (HOSPITAL_COMMUNITY)
Admission: EM | Admit: 2011-04-24 | Discharge: 2011-04-24 | Disposition: A | Discharge status: Home / Self Care | Payer: Medicare Other | Attending: Emergency Medicine | Admitting: Emergency Medicine

## 2011-04-24 DIAGNOSIS — E669 Obesity, unspecified: Secondary | ICD-10-CM | POA: Insufficient documentation

## 2011-04-24 DIAGNOSIS — M79609 Pain in unspecified limb: Secondary | ICD-10-CM | POA: Insufficient documentation

## 2011-04-24 DIAGNOSIS — M7989 Other specified soft tissue disorders: Secondary | ICD-10-CM | POA: Insufficient documentation

## 2011-04-28 LAB — WET PREP, GENITAL
Clue Cells Wet Prep HPF POC: NONE SEEN
Trich, Wet Prep: NONE SEEN

## 2011-04-28 LAB — GC/CHLAMYDIA PROBE AMP, GENITAL: GC Probe Amp, Genital: NEGATIVE

## 2011-04-28 LAB — URINALYSIS, ROUTINE W REFLEX MICROSCOPIC
Bilirubin Urine: NEGATIVE
Glucose, UA: NEGATIVE mg/dL
Hgb urine dipstick: NEGATIVE
Ketones, ur: NEGATIVE mg/dL
Protein, ur: NEGATIVE mg/dL

## 2011-12-16 ENCOUNTER — Encounter (HOSPITAL_COMMUNITY): Payer: Self-pay | Admitting: Emergency Medicine

## 2011-12-16 ENCOUNTER — Emergency Department (HOSPITAL_COMMUNITY): Payer: Medicare Other

## 2011-12-16 ENCOUNTER — Emergency Department (HOSPITAL_COMMUNITY)
Admission: EM | Admit: 2011-12-16 | Discharge: 2011-12-16 | Disposition: A | Discharge status: Home / Self Care | Payer: Medicare Other | Attending: Emergency Medicine | Admitting: Emergency Medicine

## 2011-12-16 DIAGNOSIS — M79601 Pain in right arm: Secondary | ICD-10-CM

## 2011-12-16 DIAGNOSIS — M79609 Pain in unspecified limb: Secondary | ICD-10-CM | POA: Insufficient documentation

## 2011-12-16 MED ORDER — HYDROCODONE-ACETAMINOPHEN 5-325 MG PO TABS: 1.0000 | ORAL_TABLET | Freq: Four times a day (QID) | ORAL | Status: AC | PRN

## 2011-12-16 MED ORDER — HYDROCODONE-ACETAMINOPHEN 5-325 MG PO TABS
1.0000 | ORAL_TABLET | Freq: Once | ORAL | Status: AC
  Administered 2011-12-16: 1 via ORAL
  Filled 2011-12-16: qty 1

## 2011-12-16 NOTE — Progress Notes (Signed)
Orthopedic Tech Progress Note Patient Details:  [name] 30-Aug-1990 [id number]  Ortho Devices Type of Ortho Device: Arm foam sling Ortho Device/Splint Location: right arm Ortho Device/Splint Interventions: Application   [name] 12/16/2011, 11:10 PM

## 2011-12-16 NOTE — Discharge Instructions (Signed)
Your x-ray did not show any broken bones, but did show that you have some old changes from your old break in your arm. You are tender over this area so it is possible that you may have irritated this area. You've been placed in a sling for comfort. Please do not wear this for more than a few days as this may cause your arm to be stiff. Take the pain medication as needed. Apply ice to the area. If you're not improving in the next several days, please make a followup appointment with the orthopedist listed above for further evaluation and treatment.  RESOURCE GUIDE  Dental Problems  Patients with Medicaid: [facility] [id number]                                           [phone] [address] Phone:  [phone]                                                  Phone:  [phone]  If unable to pay or uninsured, contact:  [facility] or [facility]. to become qualified for the adult dental clinic.  Chronic Pain Problems Contact [facility]  [phone] Patients need to be referred by their primary care doctor.  Insufficient Money for Medicine Contact [employer]:  call "211" or [facility] (609)446-6274.  No Primary Care Doctor Call [facility]  ([phone] Other agencies that provide inexpensive medical care    [facility]  [phone]    [facility]  [phone]    [facility]  [phone]    [facility]  [phone]    [facility]  [phone]    [facility]  [phone]  Psychological Services [facility]  [phone] [facility] Services  [phone] [facility]   [phone] (emergency services [phone])  Substance Abuse Resources [facility]  [phone] [facility] [phone] [facility] 831-692-8710 Floydene Flock [phone] Residential & Outpatient Substance Abuse Program   [phone]  Abuse/Neglect [facility] Child Abuse Hotline 240 797 5505 [facility] Child Abuse Hotline 731-387-9305 (After Hours)  Emergency Shelter [facility] 4157461428  M[facility] Homes [facility] [phone] [facility] Services [phone]  MRSA Hotline #:   601-748-0373    [facility] Resources  [facility]     [employer]                          [facility] Dept. [address] [place]                       [address]      [address]  [address]  Sela Hua Phone:  [phone]                                   Phone:  [phone]                 Phone:  [place] Phone:  [place] [phone] (After Hours)

## 2011-12-16 NOTE — ED Provider Notes (Signed)
History     CSN: [id number]  Arrival date & time 12/16/11  2030   First MD Initiated Contact with Patient 12/16/11 2059      Chief Complaint  Patient presents with  . Arm Injury    (Consider location/radiation/quality/duration/timing/severity/associated sxs/prior treatment) HPI History from patient. 21 year old female who presents with right arm pain. She slipped and fell while at home today, striking her right upper arm on a table. She reports pain and swelling to the same. She does have a remote history of a right humerus fracture. She is unsure exactly where the humerus was pregnant at that time. States the pain is constant and throbbing in nature, with occasional radiation up to her shoulder. Denies any distal numbness or weakness. Able to wiggle fingers.   History reviewed. No pertinent past medical history.  Past Surgical History  Procedure Date  . Cesarean section   . Appendectomy   . Wisdom tooth extraction   . Anterior cruciate ligament repair     History reviewed. No pertinent family history.  History  Substance Use Topics  . Smoking status: Current Everyday Smoker -- 1.0 packs/day  . Smokeless tobacco: Not on file  . Alcohol Use: No    OB History    Grav Para Term Preterm Abortions TAB SAB Ect Mult Living                  Review of Systems  Constitutional: Negative.   Musculoskeletal: Positive for myalgias. Negative for joint swelling.  Skin: Negative for color change, rash and wound.    Allergies  Review of patient's allergies indicates no known allergies.  Home Medications   Current Outpatient Rx  Name Route Sig Dispense Refill  . ACETAMINOPHEN 500 MG PO TABS Oral Take 1,000 mg by mouth every 6 (six) hours as needed. For pain    . ETONOGESTREL 68 MG [place] IMPL Subcutaneous Inject 1 each into the skin once.    Marland Kitchen FEXOFENADINE HCL 180 MG PO TABS Oral Take 180 mg by mouth daily.    . IBUPROFEN 200 MG PO TABS Oral Take 400 mg by mouth every 6 (six)  hours as needed. For pain    . ADULT MULTIVITAMIN W/MINERALS CH Oral Take 1 tablet by mouth daily.      BP 100/56  Pulse 92  Temp(Src) 98.6 F (37 C) (Oral)  Resp 16  SpO2 99%  Physical Exam  Nursing note and vitals reviewed. Constitutional: She appears well-developed and well-nourished. No distress.  HENT:  Head: Normocephalic and atraumatic.  Neck: Normal range of motion.  Cardiovascular:       Slight tachycardia, likely secondary to pain  Pulmonary/Chest: Effort normal.  Musculoskeletal: Normal range of motion.       Right arm: Tenderness to distal humerus without any palpable crepitus or obvious deformity to the area. No edema noted. Full range of motion and strength of shoulder and elbow. Able to wiggle fingers with normal grip strength bilaterally. Neurovascularly intact with sensory intact to light touch in radial, median, ulnar distributions. Radial pulse intact.  Neurological: She is alert.  Skin: Skin is warm and dry. She is not diaphoretic.  Psychiatric: She has a normal mood and affect.    ED Course  Procedures (including critical care time)  Labs Reviewed - No data to display Dg Humerus Right  12/16/2011  *RADIOLOGY REPORT*  Clinical Data: Fall.  Right arm injury and pain.  Previous humerus fracture.  RIGHT HUMERUS - 2+ VIEW  Comparison: None.  Findings: Old fracture deformity of the distal humeral diaphysis is seen.  No evidence of acute fracture or dislocation.  IMPRESSION: No acute findings.  Original Report Authenticated By: Danae Orleans, M.D.     1. Right arm pain        MDM  This young female presents with pain to her right arm after a mechanical fall. She has a history of a humerus fracture to the same arm. I personally reviewed the plain films, which indicate old distal deformity/fracture to the humerus. No acute findings seen. We will treat her symptomatically and place her in a splint. Instructed RICE. Prescription for pain medication given.  Instructed followup with orthopedics if not improving in the next several days, to rule out occult fracture. Reasons to return to ED discussed.        Grant Fontana, [place] 12/16/11 [phone]

## 2011-12-16 NOTE — ED Notes (Signed)
I gave the patient a large ice pack for her arm 

## 2011-12-16 NOTE — ED Notes (Signed)
Fall about 1.5 hours ago, R arm pain from shoulder to forearm; reports swelling to R humerus- reports when 1 or 21 years old- broke humerus; CMS intact- able to bend elbow and move shoulder; ice pack given at triage

## 2011-12-17 NOTE — ED Provider Notes (Signed)
Medical screening examination/treatment/procedure(s) were performed by non-physician practitioner and as supervising physician I was immediately available for consultation/collaboration.    Nelia Shi, MD 12/17/11 [phone]

## 2012-05-05 ENCOUNTER — Encounter (HOSPITAL_COMMUNITY): Payer: Self-pay | Admitting: *Deleted

## 2012-05-05 ENCOUNTER — Emergency Department (INDEPENDENT_AMBULATORY_CARE_PROVIDER_SITE_OTHER)
Admission: EM | Admit: 2012-05-05 | Discharge: 2012-05-05 | Disposition: A | Discharge status: Home / Self Care | Payer: Medicare Other | Source: Home / Self Care | Attending: Family Medicine | Admitting: Family Medicine

## 2012-05-05 DIAGNOSIS — K0889 Other specified disorders of teeth and supporting structures: Secondary | ICD-10-CM

## 2012-05-05 DIAGNOSIS — K1370 Unspecified lesions of oral mucosa: Secondary | ICD-10-CM

## 2012-05-05 DIAGNOSIS — K089 Disorder of teeth and supporting structures, unspecified: Secondary | ICD-10-CM

## 2012-05-05 MED ORDER — HYDROCODONE-ACETAMINOPHEN 5-325 MG PO TABS: 1.0000 | ORAL_TABLET | ORAL | Status: DC | PRN

## 2012-05-05 MED ORDER — AMOXICILLIN 500 MG PO CAPS: 500.0000 mg | ORAL_CAPSULE | Freq: Three times a day (TID) | ORAL | Status: DC

## 2012-05-05 NOTE — ED Provider Notes (Signed)
History     CSN: [id number]  Arrival date & time 05/05/12  1653   First MD Initiated Contact with Patient 05/05/12 1655      Chief Complaint  Patient presents with  . Dental Pain    (Consider location/radiation/quality/duration/timing/severity/associated sxs/prior treatment) Patient is a 21 y.o. female presenting with tooth pain. The history is provided by the patient.  Dental PainThe primary symptoms include mouth pain. Primary symptoms do not include fever. Primary symptoms comment: had 2 teeth extracted on thurs, began with pain yest, thinks clot came out The symptoms are worsening. The symptoms are new. The symptoms occur constantly.    History reviewed. No pertinent past medical history.  Past Surgical History  Procedure Date  . Cesarean section   . Appendectomy   . Wisdom tooth extraction   . Anterior cruciate ligament repair     Family History  Problem Relation Age of Onset  . Family history unknown: Yes    History  Substance Use Topics  . Smoking status: Current Every Day Smoker -- 1.0 packs/day    Types: Cigarettes  . Smokeless tobacco: Not on file  . Alcohol Use: No    OB History    Grav Para Term Preterm Abortions TAB SAB Ect Mult Living                  Review of Systems  Constitutional: Negative.  Negative for fever.  HENT: Positive for dental problem.     Allergies  Review of patient's allergies indicates no known allergies.  Home Medications   Current Outpatient Rx  Name Route Sig Dispense Refill  . ACETAMINOPHEN 500 MG PO TABS Oral Take 1,000 mg by mouth every 6 (six) hours as needed. For pain    . AMOXICILLIN 500 MG PO CAPS Oral Take 1 capsule (500 mg total) by mouth 3 (three) times daily. 30 capsule 0  . ETONOGESTREL 68 MG [place] IMPL Subcutaneous Inject 1 each into the skin once.    Marland Kitchen FEXOFENADINE HCL 180 MG PO TABS Oral Take 180 mg by mouth daily.    Marland Kitchen HYDROCODONE-ACETAMINOPHEN 5-325 MG PO TABS Oral Take 1 tablet by mouth every 4 (four)  hours as needed for pain. 6 tablet 0  . IBUPROFEN 200 MG PO TABS Oral Take 400 mg by mouth every 6 (six) hours as needed. For pain    . ADULT MULTIVITAMIN W/MINERALS CH Oral Take 1 tablet by mouth daily.      BP 125/87  Pulse 102  Temp 98.3 F (36.8 C) (Oral)  Resp 19  SpO2 99%  Physical Exam  Nursing note and vitals reviewed. Constitutional: She appears well-developed and well-nourished.  HENT:  Head: Normocephalic.  Right Ear: External ear normal.  Left Ear: External ear normal.  Mouth/Throat: Oropharynx is clear and moist.      ED Course  Procedures (including critical care time)  Labs Reviewed - No data to display No results found.   1. Pain, dental       MDM          Linna Hoff, MD 05/05/12 [phone]

## 2012-05-05 NOTE — ED Notes (Signed)
Pt reports having teeth pulled on Thursday,"i lost a blood clot out of one of them yesterday and I can taste this nasty stuff in my mouth. I took the last of my pain meds this morning" pt does report smoking since teeth removal

## 2012-05-30 ENCOUNTER — Emergency Department (HOSPITAL_COMMUNITY)
Admission: EM | Admit: 2012-05-30 | Discharge: 2012-05-30 | Discharge status: Against Medical Advice | Payer: Medicare Other | Attending: Emergency Medicine | Admitting: Emergency Medicine

## 2012-05-30 DIAGNOSIS — J029 Acute pharyngitis, unspecified: Secondary | ICD-10-CM | POA: Insufficient documentation

## 2012-06-15 ENCOUNTER — Encounter (HOSPITAL_COMMUNITY): Payer: Self-pay | Admitting: *Deleted

## 2012-06-15 ENCOUNTER — Emergency Department (HOSPITAL_COMMUNITY)
Admission: EM | Admit: 2012-06-15 | Discharge: 2012-06-15 | Disposition: A | Discharge status: Home / Self Care | Payer: Medicare Other | Attending: Emergency Medicine | Admitting: Emergency Medicine

## 2012-06-15 DIAGNOSIS — Z79899 Other long term (current) drug therapy: Secondary | ICD-10-CM | POA: Insufficient documentation

## 2012-06-15 DIAGNOSIS — K0889 Other specified disorders of teeth and supporting structures: Secondary | ICD-10-CM

## 2012-06-15 DIAGNOSIS — K089 Disorder of teeth and supporting structures, unspecified: Secondary | ICD-10-CM | POA: Insufficient documentation

## 2012-06-15 DIAGNOSIS — F17200 Nicotine dependence, unspecified, uncomplicated: Secondary | ICD-10-CM | POA: Insufficient documentation

## 2012-06-15 DIAGNOSIS — Z9889 Other specified postprocedural states: Secondary | ICD-10-CM | POA: Insufficient documentation

## 2012-06-15 MED ORDER — HYDROCODONE-ACETAMINOPHEN 5-325 MG PO TABS: 2.0000 | ORAL_TABLET | ORAL | Status: DC | PRN

## 2012-06-15 MED ORDER — OXYCODONE-ACETAMINOPHEN 5-325 MG PO TABS
2.0000 | ORAL_TABLET | Freq: Once | ORAL | Status: AC
  Administered 2012-06-15: 2 via ORAL
  Filled 2012-06-15: qty 2

## 2012-06-15 MED ORDER — PENICILLIN V POTASSIUM 500 MG PO TABS: 500.0000 mg | ORAL_TABLET | Freq: Four times a day (QID) | ORAL | Status: DC

## 2012-06-15 NOTE — ED Provider Notes (Signed)
History     CSN: [id number]  Arrival date & time 06/15/12  4098   First MD Initiated Contact with Patient 06/15/12 1000      Chief Complaint  Patient presents with  . Dental Pain    (Consider location/radiation/quality/duration/timing/severity/associated sxs/prior treatment) HPI Comments: This is a 21 year old female who presents to the ED with a chief complaint of dental pain.  She states that has had pain for several weeks.  She has been treated with amoxicillin, clindamycin, and had her tooth pulled on Tuesday by her dentist.  She has a f/u appointment with her dentist on Monday, but states that she is in too much pain to make it.  She says that her pain is 10/10.  Nothing makes her pain better or worse.  The history is provided by the patient. No language interpreter was used.    History reviewed. No pertinent past medical history.  Past Surgical History  Procedure Date  . Cesarean section   . Appendectomy   . Wisdom tooth extraction   . Anterior cruciate ligament repair     History reviewed. No pertinent family history.  History  Substance Use Topics  . Smoking status: Current Every Day Smoker -- 1.0 packs/day    Types: Cigarettes  . Smokeless tobacco: Not on file  . Alcohol Use: No    OB History    Grav Para Term Preterm Abortions TAB SAB Ect Mult Living                  Review of Systems  All other systems reviewed and are negative.    Allergies  Review of patient's allergies indicates no known allergies.  Home Medications   Current Outpatient Rx  Name  Route  Sig  Dispense  Refill  . ETONOGESTREL 68 MG [place] IMPL   Subcutaneous   Inject 1 each into the skin once.         Marland Kitchen HYDROCODONE-ACETAMINOPHEN 5-325 MG PO TABS   Oral   Take 1 tablet by mouth every 4 (four) hours as needed for pain.   6 tablet   0   . IBUPROFEN 200 MG PO TABS   Oral   Take 400 mg by mouth every 6 (six) hours as needed. For pain         . ADULT MULTIVITAMIN  W/MINERALS CH   Oral   Take 1 tablet by mouth daily.         Marland Kitchen PHENTERMINE HCL 37.5 MG PO CAPS   Oral   Take 37.5 mg by mouth daily.         Marland Kitchen CLINDAMYCIN HCL 300 MG PO CAPS   Oral   Take 300 mg by mouth 3 (three) times daily.           BP 122/90  Pulse 129  Temp 98.3 F (36.8 C) (Oral)  Resp 18  SpO2 100%  Physical Exam  Nursing note and vitals reviewed. Constitutional: She is oriented to person, place, and time. She appears well-developed and well-nourished.  HENT:  Head: Normocephalic and atraumatic.  Mouth/Throat: Oropharynx is clear and moist. No oropharyngeal exudate.       Poor dentition throughout, no signs of infection, no purulence.  Uvula is midline, no signs of tonsillar or peritonsillar abscess. Healing socket from prior tooth extraction on bottom right back gumline.  Eyes: Conjunctivae normal and EOM are normal. Pupils are equal, round, and reactive to light.  Neck: Normal range of motion. Neck supple.  Cardiovascular: Normal rate and regular rhythm.  Exam reveals no gallop and no friction rub.   No murmur heard. Pulmonary/Chest: Effort normal and breath sounds normal. No respiratory distress. She has no wheezes. She has no rales. She exhibits no tenderness.  Abdominal: Soft. Bowel sounds are normal. She exhibits no distension and no mass. There is no tenderness. There is no rebound and no guarding.  Musculoskeletal: Normal range of motion. She exhibits no edema and no tenderness.  Neurological: She is alert and oriented to person, place, and time.  Skin: Skin is warm and dry.  Psychiatric: She has a normal mood and affect. Her behavior is normal. Judgment and thought content normal.    ED Course  Procedures (including critical care time)  Labs Reviewed - No data to display No results found.   No diagnosis found.    MDM  21 year old female with dental pain.  I have discussed this patient with Dr. Ranae Palms. I am going to discharge the patient to  home with f/u with her dentist on Monday.  I am going to give her Norco and penicillin to hold her over until her appointment.    The patient is concerned about abscess.  I don't see any signs of one.  I believe her pain to be from the recent tooth extraction.  Patient understands the plan, and agrees to follow-up on Monday.  Treatments 1. Norco 2. Penicillin        Roxy Horseman, PA-C 06/15/12 1039

## 2012-06-15 NOTE — ED Notes (Signed)
Reports having right lower dental abscess, had tooth pulled on Tuesday, has finished all her antibiotics and still having swelling and severe pain. Airway is intact.

## 2012-06-16 NOTE — ED Provider Notes (Signed)
Medical screening examination/treatment/procedure(s) were performed by non-physician practitioner and as supervising physician I was immediately available for consultation/collaboration.   Gracy Ehly, MD 06/16/12 0803 

## 2012-10-17 ENCOUNTER — Encounter (HOSPITAL_COMMUNITY): Payer: Self-pay | Admitting: Emergency Medicine

## 2012-10-17 ENCOUNTER — Emergency Department (HOSPITAL_COMMUNITY): Payer: Medicare Other

## 2012-10-17 ENCOUNTER — Emergency Department (HOSPITAL_COMMUNITY)
Admission: EM | Admit: 2012-10-17 | Discharge: 2012-10-17 | Disposition: A | Discharge status: Home / Self Care | Payer: Medicare Other | Attending: Emergency Medicine | Admitting: Emergency Medicine

## 2012-10-17 DIAGNOSIS — S4980XA Other specified injuries of shoulder and upper arm, unspecified arm, initial encounter: Secondary | ICD-10-CM | POA: Insufficient documentation

## 2012-10-17 DIAGNOSIS — Z79899 Other long term (current) drug therapy: Secondary | ICD-10-CM | POA: Insufficient documentation

## 2012-10-17 DIAGNOSIS — F17200 Nicotine dependence, unspecified, uncomplicated: Secondary | ICD-10-CM | POA: Insufficient documentation

## 2012-10-17 DIAGNOSIS — S46909A Unspecified injury of unspecified muscle, fascia and tendon at shoulder and upper arm level, unspecified arm, initial encounter: Secondary | ICD-10-CM | POA: Insufficient documentation

## 2012-10-17 DIAGNOSIS — Y929 Unspecified place or not applicable: Secondary | ICD-10-CM | POA: Insufficient documentation

## 2012-10-17 DIAGNOSIS — M79601 Pain in right arm: Secondary | ICD-10-CM

## 2012-10-17 DIAGNOSIS — Y939 Activity, unspecified: Secondary | ICD-10-CM | POA: Insufficient documentation

## 2012-10-17 DIAGNOSIS — X503XXA Overexertion from repetitive movements, initial encounter: Secondary | ICD-10-CM | POA: Insufficient documentation

## 2012-10-17 MED ORDER — HYDROCODONE-ACETAMINOPHEN 5-325 MG PO TABS
1.0000 | ORAL_TABLET | Freq: Once | ORAL | Status: AC
  Administered 2012-10-17: 1 via ORAL
  Filled 2012-10-17: qty 1

## 2012-10-17 MED ORDER — IBUPROFEN 600 MG PO TABS: 600.0000 mg | ORAL_TABLET | Freq: Four times a day (QID) | ORAL | Status: DC | PRN

## 2012-10-17 NOTE — ED Provider Notes (Signed)
Medical screening examination/treatment/procedure(s) were performed by non-physician practitioner and as supervising physician I was immediately available for consultation/collaboration. Devoria Albe, MD, [facility]   Ward Givens, MD 10/17/12 2021

## 2012-10-17 NOTE — ED Notes (Signed)
Patient transported to X-ray 

## 2012-10-17 NOTE — ED Notes (Signed)
Pt complains of right shoulder pain after "trying to catch my dad when he was falling"

## 2012-10-17 NOTE — ED Notes (Signed)
MD at bedside. 

## 2012-10-17 NOTE — ED Provider Notes (Signed)
History    This chart was scribed for non-physician practitioner working with Ward Givens, MD by Leone Payor, ED Scribe. This patient was seen in room WTR5/WTR5 and the patient's care was started at 1739.   CSN: [id number]  Arrival date & time 10/17/12  1739   First MD Initiated Contact with Patient 10/17/12 1741      Chief Complaint  Patient presents with  . Shoulder Pain     The history is provided by the patient. No language interpreter was used.    [name] is a 22 y.o. female who presents to the Emergency Department complaining of new, constant, unchanged right shoulder pain that radiates down to forearm starting 1 day ago. Pt states she injured it while trying to catch her father from falling. States her father landed on her shoulder. [place] reporting substance abuse database shows she had 13 prescriptions for narcotics filled in past 6 months. She denies LOC, head injury, neck pain, back pain. Pt is a current everyday smoker but denies alcohol use. Movement of the arm makes the pain worse. Nothing makes it better. She's taken ibuprofen without relief. No numbness, tingling, or weakness of her right arm.   History reviewed. No pertinent past medical history.  Past Surgical History  Procedure Laterality Date  . Cesarean section    . Appendectomy    . Wisdom tooth extraction    . Anterior cruciate ligament repair      No family history on file.  History  Substance Use Topics  . Smoking status: Current Every Day Smoker -- 1.00 packs/day    Types: Cigarettes  . Smokeless tobacco: Not on file  . Alcohol Use: No    OB History   Grav Para Term Preterm Abortions TAB SAB Ect Mult Living                  Review of Systems  Constitutional: Positive for activity change.  HENT: Negative for neck pain.   Musculoskeletal: Positive for arthralgias (right shoulder pain). Negative for back pain and joint swelling.  Skin: Negative for wound.  Neurological: Negative for syncope,  weakness and numbness.    Allergies  Review of patient's allergies indicates no known allergies.  Home Medications   Current Outpatient Rx  Name  Route  Sig  Dispense  Refill  . clindamycin (CLEOCIN) 300 MG capsule   Oral   Take 300 mg by mouth 3 (three) times daily.         Marland Kitchen etonogestrel (IMPLANON) 68 MG IMPL implant   Subcutaneous   Inject 1 each into the skin once.         Marland Kitchen HYDROcodone-acetaminophen (NORCO/VICODIN) 5-325 MG per tablet   Oral   Take 1 tablet by mouth every 4 (four) hours as needed for pain.   6 tablet   0   . HYDROcodone-acetaminophen (NORCO/VICODIN) 5-325 MG per tablet   Oral   Take 2 tablets by mouth every 4 (four) hours as needed for pain.   6 tablet   0   . ibuprofen (ADVIL,MOTRIN) 200 MG tablet   Oral   Take 400 mg by mouth every 6 (six) hours as needed. For pain         . Multiple Vitamin (MULITIVITAMIN WITH MINERALS) TABS   Oral   Take 1 tablet by mouth daily.         . penicillin v potassium (VEETID) 500 MG tablet   Oral   Take 1 tablet (500  mg total) by mouth 4 (four) times daily.   40 tablet   0   . phentermine 37.5 MG capsule   Oral   Take 37.5 mg by mouth daily.           BP 134/80  Pulse 96  Temp(Src) 98.1 F (36.7 C) (Oral)  Resp 16  SpO2 97%  Physical Exam  Nursing note and vitals reviewed. Constitutional: She is oriented to person, place, and time. She appears well-developed and well-nourished. No distress.  HENT:  Head: Normocephalic and atraumatic.  Eyes: EOM are normal. Pupils are equal, round, and reactive to light.  Neck: Normal range of motion. Neck supple. No tracheal deviation present.  Cardiovascular: Normal rate.  Exam reveals no decreased pulses.   Pulses:      Radial pulses are 2+ on the right side, and 2+ on the left side.  Pulmonary/Chest: Effort normal. No respiratory distress.  Musculoskeletal: Normal range of motion. She exhibits tenderness. She exhibits no edema.       Right shoulder:  She exhibits tenderness and bony tenderness. She exhibits normal range of motion, no swelling and no deformity.       Right elbow: She exhibits normal range of motion and no swelling. Tenderness found.       Right wrist: Normal.       Cervical back: She exhibits normal range of motion, no tenderness and no bony tenderness.       Right upper arm: She exhibits tenderness. She exhibits no bony tenderness and no swelling.       Right forearm: She exhibits no tenderness.       Arms:      Right hand: Normal. Normal sensation noted. Normal strength noted.  General tenderness over left shoulder and upper arm over deltoid.   Neurological: She is alert and oriented to person, place, and time. No sensory deficit.  Motor, sensation, and vascular distal to the injury is fully intact.   Skin: Skin is warm and dry.  Psychiatric: She has a normal mood and affect. Her behavior is normal.    ED Course  Procedures (including critical care time)  DIAGNOSTIC STUDIES: Oxygen Saturation is 100% on room air, normal by my interpretation.    COORDINATION OF CARE: 5:54 PM Discussed treatment plan with pt at bedside and pt agreed to plan.    Labs Reviewed - No data to display Dg Shoulder Right  10/17/2012  *RADIOLOGY REPORT*  Clinical Data: Right shoulder pain.  RIGHT SHOULDER - 2+ VIEW  Comparison: None  Findings: The joint spaces are maintained.  No acute bony findings. No abnormal soft tissue calcifications.  The right lung apex is clear.  IMPRESSION: No acute bony findings.   Original Report Authenticated By: Rudie Meyer, M.D.      1. Arm pain, right     Patient seen and examined. Work-up initiated.   Vital signs reviewed and are as follows: Filed Vitals:   10/17/12 1744  BP: 134/80  Pulse: 134  Temp: 98.1 F (36.7 C)  Resp: 19    Patient informed of x-ray results. Pt continues with elevated HR. I asked her directly about her narcotic rx use over the past several months and asked if she could  be withdrawing. She states that she has not used any narcotics medications in 2-3 weeks and states she does not have a problem with this.   One hydrocodone ordered. Will re-eval HR.   Heart rate improved. Will discharge to home. Orthopedic followup  given if not improving. Sling given.   MDM  Patient with shoulder injury. X-rays are negative. Right upper extremity is neurovascularly intact. No concern for compartment syndrome. Concern for narcotics abuse given multiple prescriptions from multiple different providers over past several months. Conservative management indicated for this injury.     I personally performed the services described in this documentation, which was scribed in my presence. The recorded information has been reviewed and is accurate.   Renne Crigler, PA-C 10/17/12 1927

## 2012-12-24 ENCOUNTER — Ambulatory Visit: Payer: Self-pay | Admitting: Obstetrics

## 2014-03-16 ENCOUNTER — Encounter (HOSPITAL_COMMUNITY): Payer: Self-pay | Admitting: Emergency Medicine

## 2014-03-16 ENCOUNTER — Emergency Department (HOSPITAL_COMMUNITY)
Admission: EM | Admit: 2014-03-16 | Discharge: 2014-03-16 | Disposition: A | Discharge status: Home / Self Care | Payer: Medicare Other | Attending: Emergency Medicine | Admitting: Emergency Medicine

## 2014-03-16 DIAGNOSIS — F17200 Nicotine dependence, unspecified, uncomplicated: Secondary | ICD-10-CM | POA: Insufficient documentation

## 2014-03-16 DIAGNOSIS — Z79899 Other long term (current) drug therapy: Secondary | ICD-10-CM | POA: Diagnosis not present

## 2014-03-16 DIAGNOSIS — Z8659 Personal history of other mental and behavioral disorders: Secondary | ICD-10-CM | POA: Diagnosis not present

## 2014-03-16 DIAGNOSIS — F1110 Opioid abuse, uncomplicated: Secondary | ICD-10-CM | POA: Diagnosis present

## 2014-03-16 HISTORY — DX: Bipolar disorder, unspecified: F31.9

## 2014-03-16 MED ORDER — LOPERAMIDE HCL 2 MG PO CAPS: 2.0000 mg | ORAL_CAPSULE | Freq: Four times a day (QID) | ORAL | Status: DC | PRN

## 2014-03-16 MED ORDER — CYCLOBENZAPRINE HCL 10 MG PO TABS: 10.0000 mg | ORAL_TABLET | Freq: Two times a day (BID) | ORAL | Status: DC | PRN

## 2014-03-16 NOTE — Progress Notes (Signed)
  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     Subjective/Objective Assessment Detail:   pcp per pt from her medicaid card is  [facility]  8:00 am - 7:30 pm  [address] [place]  [place], [place] [address]  [phone]     Action/Plan:   ED CM spoke with pt provided with lists of medicaid providers for [place] & a list of medicare accepting providers within zip [phone] updated Epic with [facility]   Action/Plan Detail:   Discussed the need to find an accepting medicaid provider and inform DSS of change of pcp prior to going to new accepting provider Pt voiced understanding   Anticipated DC Date:  03/16/2014     Status Recommendation to Physician:   Result of Recommendation:    Other ED Services  Consult Working Plan    DC Planning Services  Other  PCP issues  Outpatient Services - Pt will follow up    Choice offered to / List presented to:            Status of service:  Completed, signed off  ED Comments:   ED Comments Detail:  Follow-up With Details Comments Contact Info List of medicaid & medicare providers provided by Case manager  Schedule an appointment as soon as possible for a visit As needed - as receommended by Emergency doctor/Nurse

## 2014-03-16 NOTE — ED Provider Notes (Signed)
CSN: [id number]     Arrival date & time 03/16/14  6440 History   First MD Initiated Contact with Patient 03/16/14 1007     Chief Complaint  Patient presents with  . detox      (Consider location/radiation/quality/duration/timing/severity/associated sxs/prior Treatment) HPI Comments: Patient presents emergency department with chief complaint of requesting detox from opiates. She states that she is a long-time user of OxyContin, and wishes to stop now. She states that her last use was 24 hours ago. She reports having withdrawal symptoms such as aching body, and diarrhea. She denies any suicidal or homicidal ideation. She denies any other health problems or complaints currently.  The history is provided by the patient. No language interpreter was used.    Past Medical History  Diagnosis Date  . Bipolar disorder    Past Surgical History  Procedure Laterality Date  . Cesarean section    . Appendectomy    . Wisdom tooth extraction    . Anterior cruciate ligament repair     No family history on file. History  Substance Use Topics  . Smoking status: Current Every Day Smoker -- 1.00 packs/day    Types: Cigarettes  . Smokeless tobacco: Not on file  . Alcohol Use: No   OB History   Grav Para Term Preterm Abortions TAB SAB Ect Mult Living                 Review of Systems  All other systems reviewed and are negative.     Allergies  Haldol  Home Medications   Prior to Admission medications   Medication Sig Start Date End Date Taking? Authorizing Provider  etonogestrel (IMPLANON) 68 MG IMPL implant Inject 1 each into the skin once.   Yes Historical Provider, MD  OVER THE COUNTER MEDICATION Take 2 tablets by mouth once. Medique - Back Pain off   Yes Historical Provider, MD  OxyCODONE HCl ER (OXYCONTIN) 30 MG T12A Take 1 tablet by mouth every 4 (four) hours as needed (for pain).   Yes Historical Provider, MD  cyclobenzaprine (FLEXERIL) 10 MG tablet Take 1 tablet (10 mg total)  by mouth 2 (two) times daily as needed for muscle spasms. 03/16/14   Roxy Horseman, PA-C  loperamide (IMODIUM) 2 MG capsule Take 1 capsule (2 mg total) by mouth 4 (four) times daily as needed for diarrhea or loose stools. 03/16/14   Roxy Horseman, PA-C   BP 134/91  Pulse 104  Temp(Src) 97.9 F (36.6 C) (Oral)  Resp 20  SpO2 100% Physical Exam  Nursing note and vitals reviewed. Constitutional: She is oriented to person, place, and time. She appears well-developed and well-nourished.  HENT:  Head: Normocephalic and atraumatic.  Eyes: Conjunctivae and EOM are normal. Pupils are equal, round, and reactive to light.  Neck: Normal range of motion. Neck supple.  Cardiovascular: Normal rate and regular rhythm.  Exam reveals no gallop and no friction rub.   No murmur heard. Normal rate on my exam  Pulmonary/Chest: Effort normal and breath sounds normal. No respiratory distress. She has no wheezes. She has no rales. She exhibits no tenderness.  Abdominal: Soft. Bowel sounds are normal. She exhibits no distension and no mass. There is no tenderness. There is no rebound and no guarding.  Musculoskeletal: Normal range of motion. She exhibits no edema and no tenderness.  Neurological: She is alert and oriented to person, place, and time.  Skin: Skin is warm and dry.  Psychiatric: She has a normal mood and  affect. Her behavior is normal. Judgment and thought content normal.    ED Course  Procedures (including critical care time) Labs Review Labs Reviewed - No data to display  Imaging Review No results found.   EKG Interpretation None      MDM   Final diagnoses:  Opiate abuse, continuous    Patient requesting opiate detox. Informed her of the needed departmental policy, that opiate detox patients are now being referred to outside facilities.  She does not have any other emergent conditions today.  No SI/HI.  Will give her some imodium and flexeril.  Discussed the patient with Dr.  Lynelle Doctor.  Patient is stable and ready for discharge.    Roxy Horseman, PA-C 03/16/14 1038

## 2014-03-16 NOTE — ED Notes (Signed)
Pt requesting detox from OxyContin, states she has not had anything in 24 hours and body is aching, hot and cold, and diarrhea.

## 2014-03-16 NOTE — ED Provider Notes (Signed)
Medical screening examination/treatment/procedure(s) were performed by non-physician practitioner and as supervising physician I was immediately available for consultation/collaboration.    Cyleigh Massaro, MD 03/16/14 1501 

## 2014-03-16 NOTE — Discharge Instructions (Signed)
Opioid Withdrawal  Opioids are a group of narcotic drugs. They include the street drug heroin. They also include pain medicines, such as morphine, hydrocodone, oxycodone, and fentanyl. Opioid withdrawal is a group of characteristic physical and mental signs and symptoms. It typically occurs if you have been using opioids daily for several weeks or longer and stop using or rapidly decrease use. Opioid withdrawal can also occur if you have used opioids daily for a long time and are given a medicine to block the effect.   SIGNS AND SYMPTOMS  Opioid withdrawal includes three or more of the following symptoms:   · Depressed, anxious, or irritable mood.  · Nausea or vomiting.  · Muscle aches or spasms.    · Watery eyes.     · Runny nose.  · Dilated pupils, sweating, or hairs standing on end.  · Diarrhea or intestinal cramping.  · Yawning.    · Fever.  · Increased blood pressure.  · Fast pulse.  · Restlessness or trouble sleeping.  These signs and symptoms occur within several hours of stopping or reducing short-acting opioids, such as heroin. They can occur within 3 days of stopping or reducing long-acting opioids, such as methadone. Withdrawal begins within minutes of receiving a drug that blocks the effects of opioids, such as naltrexone or naloxone.  DIAGNOSIS   Opioid use disorder is diagnosed by your health care provider. You will be asked about your symptoms, drug and alcohol use, medical history, and use of medicines. A physical exam may be done. Lab tests may be ordered. Your health care provider may have you see a mental health professional.   TREATMENT   The treatment for opioid withdrawal is usually provided by medical doctors with special training in substance use disorders (addiction specialists). The following medicines may be included in treatment:  · Opioids given in place of the abused opioid. They turn on opioid receptors in the brain and lessen or prevent withdrawal symptoms. They are gradually  decreased (opioid substitution and taper).  · Non-opioids that can lessen certain opioid withdrawal symptoms. They may be used alone or with opioid substitution and taper.  Successful long-term recovery usually requires medicine, counseling, and group support.  HOME CARE INSTRUCTIONS   · Take medicines only as directed by your health care provider.  · Check with your health care provider before starting new medicines.  · Keep all follow-up visits as directed by your health care provider.  SEEK MEDICAL CARE IF:  · You are not able to take your medicines as directed.  · Your symptoms get worse.  · You relapse.  SEEK IMMEDIATE MEDICAL CARE IF:  · You have serious thoughts about hurting yourself or others.  · You have a seizure.  · You lose consciousness.  Document Released: 07/13/2003 Document Revised: 11/24/2013 Document Reviewed: 07/23/2013  ExitCare® Patient Information ©2015 [employer]. This information is not intended to replace advice given to you by your health care provider. Make sure you discuss any questions you have with your health care provider.

## 2014-04-17 ENCOUNTER — Emergency Department (HOSPITAL_BASED_OUTPATIENT_CLINIC_OR_DEPARTMENT_OTHER)
Admission: EM | Admit: 2014-04-17 | Discharge: 2014-04-17 | Disposition: A | Discharge status: Home / Self Care | Payer: Medicare Other | Attending: Emergency Medicine | Admitting: Emergency Medicine

## 2014-04-17 ENCOUNTER — Emergency Department (HOSPITAL_BASED_OUTPATIENT_CLINIC_OR_DEPARTMENT_OTHER): Payer: Medicare Other

## 2014-04-17 ENCOUNTER — Encounter (HOSPITAL_BASED_OUTPATIENT_CLINIC_OR_DEPARTMENT_OTHER): Payer: Self-pay | Admitting: Emergency Medicine

## 2014-04-17 DIAGNOSIS — F319 Bipolar disorder, unspecified: Secondary | ICD-10-CM | POA: Diagnosis not present

## 2014-04-17 DIAGNOSIS — R059 Cough, unspecified: Secondary | ICD-10-CM | POA: Diagnosis not present

## 2014-04-17 DIAGNOSIS — Z79899 Other long term (current) drug therapy: Secondary | ICD-10-CM | POA: Diagnosis not present

## 2014-04-17 DIAGNOSIS — S99929A Unspecified injury of unspecified foot, initial encounter: Secondary | ICD-10-CM

## 2014-04-17 DIAGNOSIS — F17200 Nicotine dependence, unspecified, uncomplicated: Secondary | ICD-10-CM | POA: Insufficient documentation

## 2014-04-17 DIAGNOSIS — Y929 Unspecified place or not applicable: Secondary | ICD-10-CM | POA: Insufficient documentation

## 2014-04-17 DIAGNOSIS — IMO0002 Reserved for concepts with insufficient information to code with codable children: Secondary | ICD-10-CM | POA: Insufficient documentation

## 2014-04-17 DIAGNOSIS — S8990XA Unspecified injury of unspecified lower leg, initial encounter: Secondary | ICD-10-CM | POA: Diagnosis present

## 2014-04-17 DIAGNOSIS — S99919A Unspecified injury of unspecified ankle, initial encounter: Secondary | ICD-10-CM | POA: Diagnosis present

## 2014-04-17 DIAGNOSIS — R05 Cough: Secondary | ICD-10-CM | POA: Insufficient documentation

## 2014-04-17 DIAGNOSIS — Y9368 Activity, volleyball (beach) (court): Secondary | ICD-10-CM | POA: Insufficient documentation

## 2014-04-17 DIAGNOSIS — W219XXA Striking against or struck by unspecified sports equipment, initial encounter: Secondary | ICD-10-CM | POA: Diagnosis not present

## 2014-04-17 DIAGNOSIS — S86812A Strain of other muscle(s) and tendon(s) at lower leg level, left leg, initial encounter: Secondary | ICD-10-CM

## 2014-04-17 HISTORY — DX: Tachycardia, unspecified: R00.0

## 2014-04-17 MED ORDER — BENZONATATE 100 MG PO CAPS: 100.0000 mg | ORAL_CAPSULE | Freq: Three times a day (TID) | ORAL | Status: DC

## 2014-04-17 MED ORDER — IBUPROFEN 800 MG PO TABS: 800.0000 mg | ORAL_TABLET | Freq: Three times a day (TID) | ORAL | Status: DC

## 2014-04-17 NOTE — ED Provider Notes (Signed)
CSN: [id number]     Arrival date & time 04/17/14  1338 History   First MD Initiated Contact with Patient 04/17/14 1353     Chief Complaint  Patient presents with  . Leg Pain  . Cough     (Consider location/radiation/quality/duration/timing/severity/associated sxs/prior Treatment) HPI Comments: Pt c/o 2 things today. Pain in the left lower leg and cough times 6 weeks. Pt states that she was playing volleyball yesterday and she fell and she woke up with pain in her left leg with flexing a walking. Denies previous injury. No swelling. Hasn't taken anything for the symptoms. Has a cough for the last 6 weeks that is productive. No fever.  The history is provided by the patient. No language interpreter was used.    Past Medical History  Diagnosis Date  . Bipolar disorder   . Sinus tachycardia    Past Surgical History  Procedure Laterality Date  . Cesarean section    . Appendectomy    . Wisdom tooth extraction    . Anterior cruciate ligament repair     No family history on file. History  Substance Use Topics  . Smoking status: Current Every Day Smoker -- 1.00 packs/day    Types: Cigarettes  . Smokeless tobacco: Not on file  . Alcohol Use: No   OB History   Grav Para Term Preterm Abortions TAB SAB Ect Mult Living                 Review of Systems  Constitutional: Negative.   Respiratory: Positive for cough.   Cardiovascular: Negative.   Genitourinary: Negative.       Allergies  Haldol  Home Medications   Prior to Admission medications   Medication Sig Start Date End Date Taking? Authorizing Provider  FLUoxetine HCl (PROZAC PO) Take by mouth.   Yes Historical Provider, MD  Lurasidone HCl (LATUDA PO) Take by mouth.   Yes Historical Provider, MD  cyclobenzaprine (FLEXERIL) 10 MG tablet Take 1 tablet (10 mg total) by mouth 2 (two) times daily as needed for muscle spasms. 03/16/14   Roxy Horseman, PA-C  etonogestrel (IMPLANON) 68 MG IMPL implant Inject 1 each into the  skin once.    Historical Provider, MD  loperamide (IMODIUM) 2 MG capsule Take 1 capsule (2 mg total) by mouth 4 (four) times daily as needed for diarrhea or loose stools. 03/16/14   Roxy Horseman, PA-C  OVER THE COUNTER MEDICATION Take 2 tablets by mouth once. Medique - Back Pain off    Historical Provider, MD  OxyCODONE HCl ER (OXYCONTIN) 30 MG T12A Take 1 tablet by mouth every 4 (four) hours as needed (for pain).    Historical Provider, MD   BP 139/89  Pulse 108  Temp(Src) 98.5 F (36.9 C) (Oral)  Resp 20  Ht 5' (1.524 m)  Wt 210 lb (95.255 kg)  BMI 41.01 kg/m2  SpO2 99% Physical Exam  Nursing note and vitals reviewed. Constitutional: She appears well-developed and well-nourished.  Cardiovascular: Normal rate and regular rhythm.   Pulmonary/Chest: Effort normal and breath sounds normal.  Musculoskeletal: Normal range of motion.  Pt able to flex and extend foot:pain with palpation about 2 inches above the ankle. No swelling or deformity noted  Neurological: She is alert.  Skin: Skin is warm and dry.  Psychiatric: She has a normal mood and affect.    ED Course  Procedures (including critical care time) Labs Review Labs Reviewed - No data to display  Imaging Review Dg Chest  2 View  04/17/2014   CLINICAL DATA:  Cough  EXAM: CHEST  2 VIEW  COMPARISON:  06/09/2010  FINDINGS: The heart size and mediastinal contours are within normal limits. Both lungs are clear. No pleural effusion or pneumothorax. The visualized skeletal structures are unremarkable.  IMPRESSION: No active cardiopulmonary disease.   Electronically Signed   By: Amie Portland M.D.   On: 04/17/2014 14:25   Dg Tibia/fibula Left  04/17/2014   CLINICAL DATA:  Larey Seat playing volleyball.  Pain at the ankle.  EXAM: LEFT TIBIA AND FIBULA - 2 VIEW  COMPARISON:  None.  FINDINGS: Negative for a fracture or dislocation. Soft tissues in left lower leg are grossly normal. There appears to be a prominent ossicle at the base of the fifth  metatarsal bone.  IMPRESSION: No acute bone abnormality involving the left tibia or fibula.   Electronically Signed   By: Richarda Overlie M.D.   On: 04/17/2014 14:26     EKG Interpretation None      MDM   Final diagnoses:  Strain of calf muscle, left, initial encounter  Cough    Likely strain.no infection noted. Will treat symptomatically. Pt to follow up with Dr. Pearletha Forge as needed    Teressa Lower, NP 04/17/14 [phone]

## 2014-04-17 NOTE — Discharge Instructions (Signed)
Ligament Sprain °A ligament sprain is when the bands of tissue that hold bones together (ligament) are stretched. °HOME CARE  °· Rest the injured area. °· Start using the joint when told to by your doctor. °· Keep the injured area raised (elevated) above the level of the heart. This may lessen puffiness (swelling). °· Put ice on the injured area. °¨ Put ice in a plastic bag. °¨ Place a towel between your skin and the bag. °¨ Leave the ice on for 15-20 minutes, 03-04 times a day. °· Wear a splint, cast, or an elastic bandage as told by your doctor. °· Only take medicine as told by your doctor. °· Use crutches as told by your doctor. Do not put weight on the injured joint until told to by your doctor. °GET HELP RIGHT AWAY IF:  °· You have more bruising, puffiness, or pain. °· The leg was injured and the toes are cold, tingling, numb, or blue. °· The arm was injured and the fingers are cold, tingling, numb, or blue. °· The pain is not helped with medicine. °· The pain gets worse. °MAKE SURE YOU:  °· Understand these instructions. °· Will watch this condition. °· Will get help right away if you are not doing well or get worse. °Document Released: 12/27/2007 Document Revised: 04/30/2013 Document Reviewed: 12/27/2007 °ExitCare® Patient Information ©2015 [employer]. This information is not intended to replace advice given to you by your health care provider. Make sure you discuss any questions you have with your health care provider. ° °

## 2014-04-17 NOTE — ED Provider Notes (Signed)
Medical screening examination/treatment/procedure(s) were performed by non-physician practitioner and as supervising physician I was immediately available for consultation/collaboration.   Candyce Churn III, MD 04/17/14 [phone]

## 2014-04-17 NOTE — ED Notes (Signed)
Patient states she was playing volleyball yesterday, fell on to her left side.  Now has pain in her left calf.  States she also has had a cough for the last six weeks, productive greenish in color.

## 2014-11-09 IMAGING — CR DG TIBIA/FIBULA 2V*L*
4 series · 4 of 4 positions shown · non-contrast
Comparison: None.

CLINICAL DATA: Fell playing volleyball.  Pain at the ankle.

EXAM:
LEFT TIBIA AND FIBULA - 2 VIEW

[t tib/fib ap left (1 of 2)]
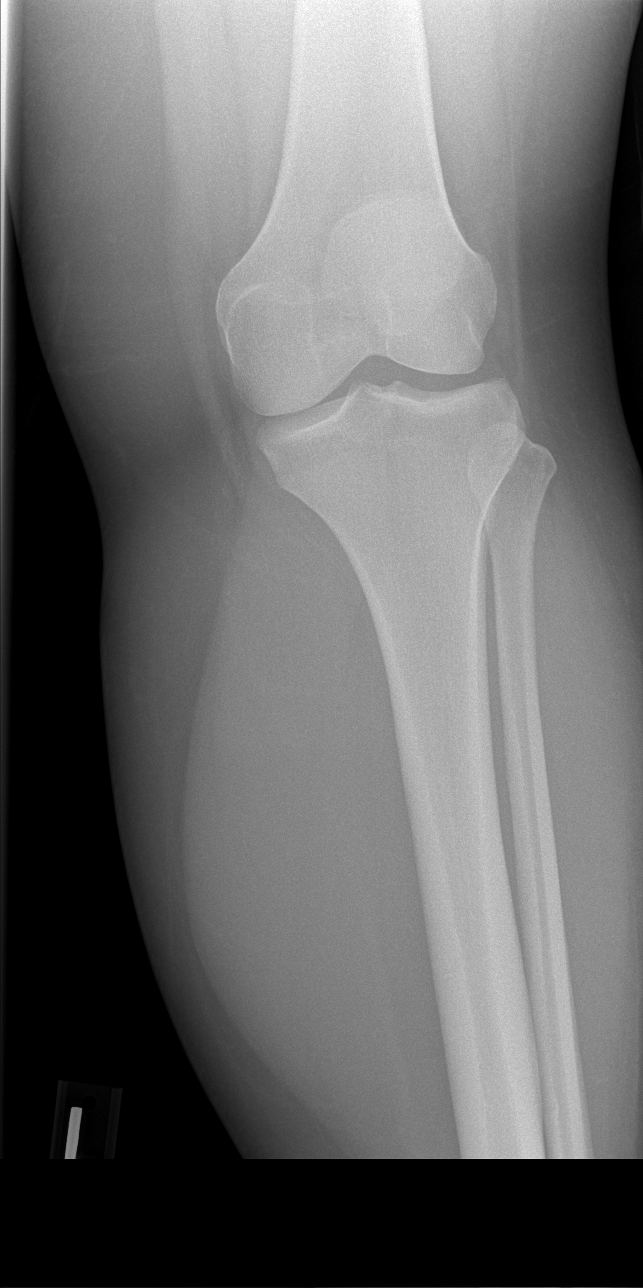

[t tib/fib ap left (2 of 2)]
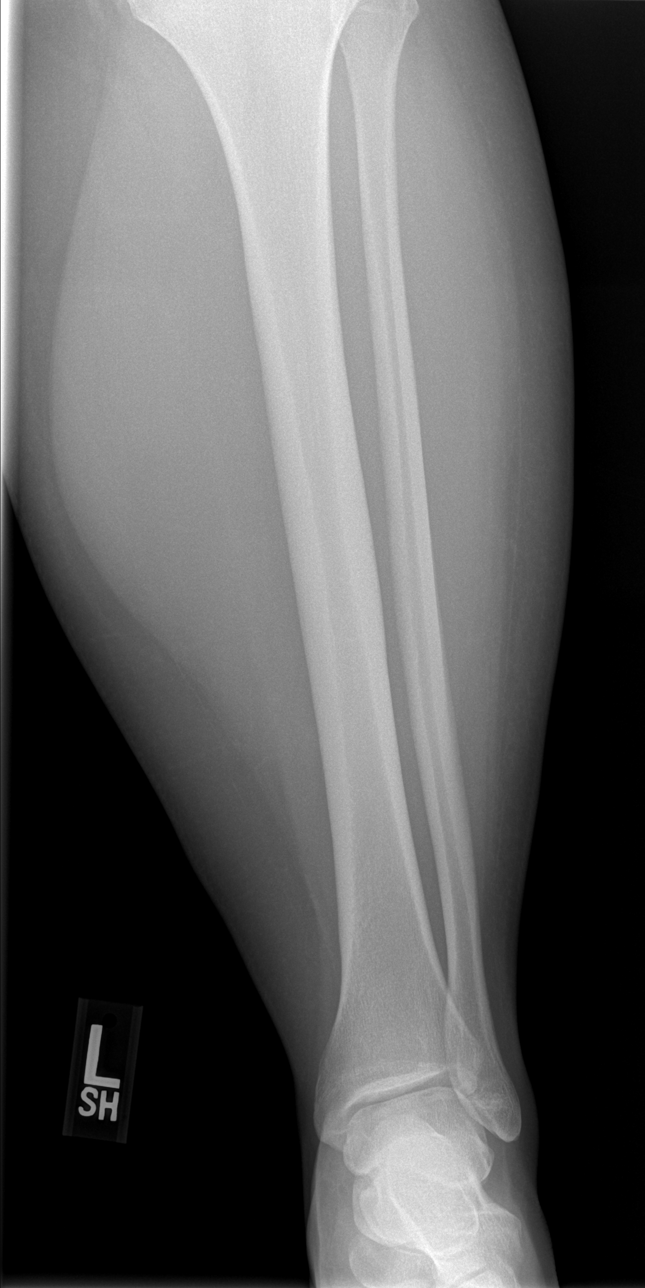

[t tib/fib lat left (1 of 2)]
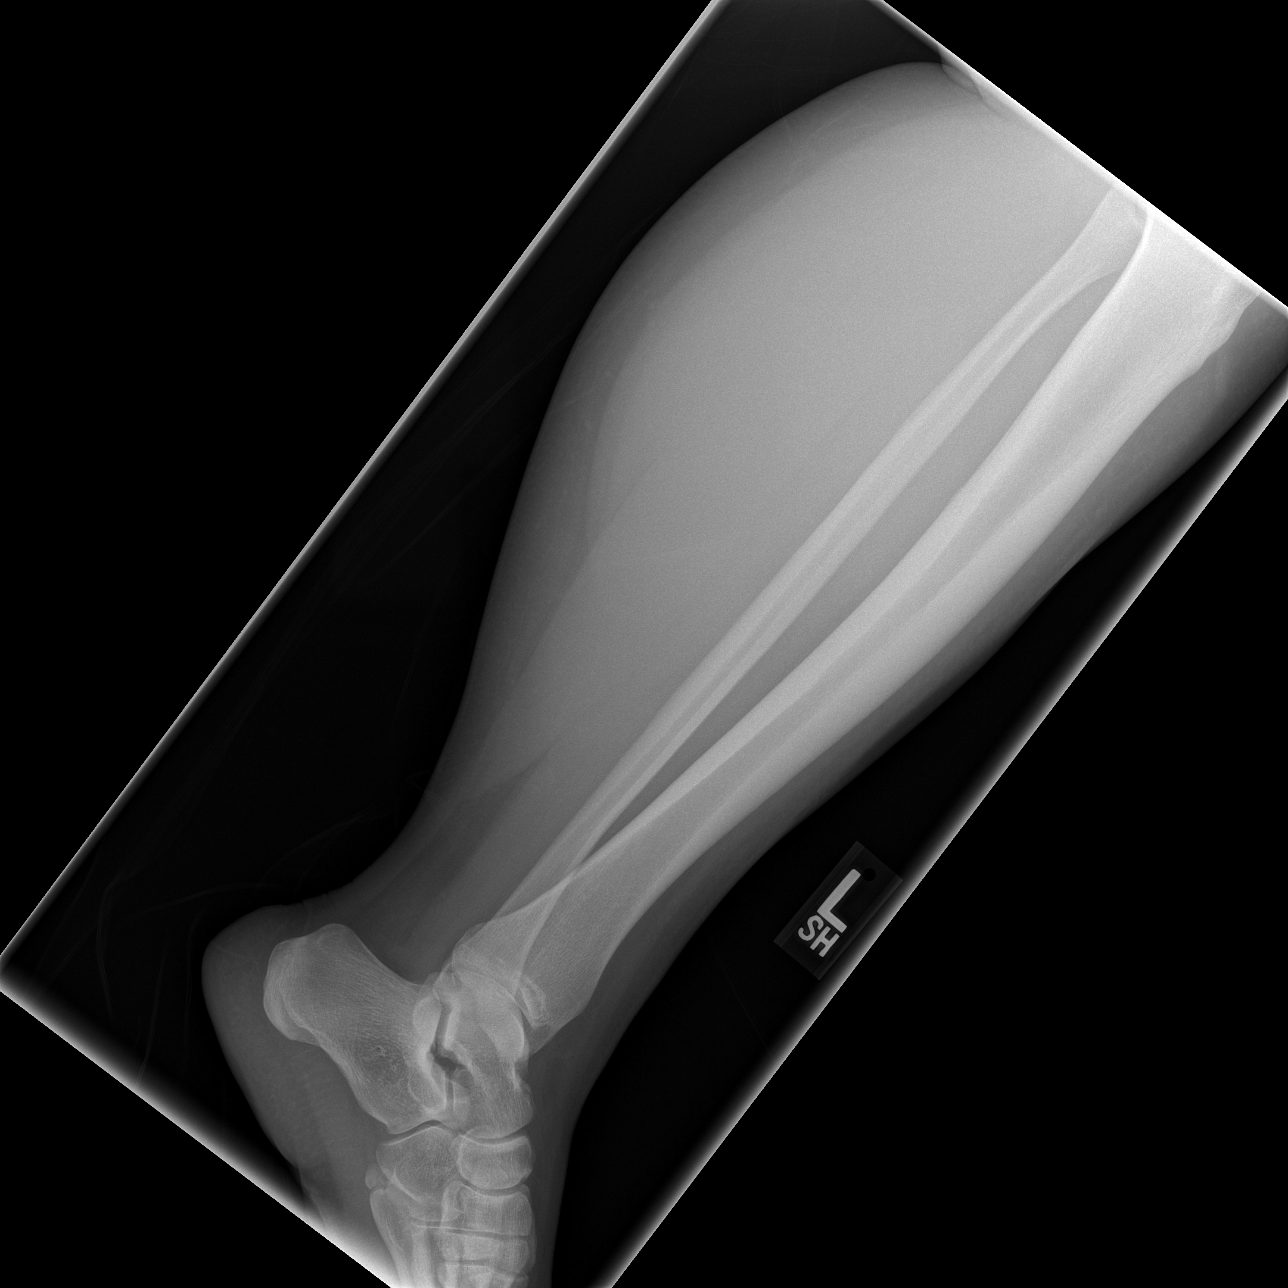

[t tib/fib lat left (2 of 2)]
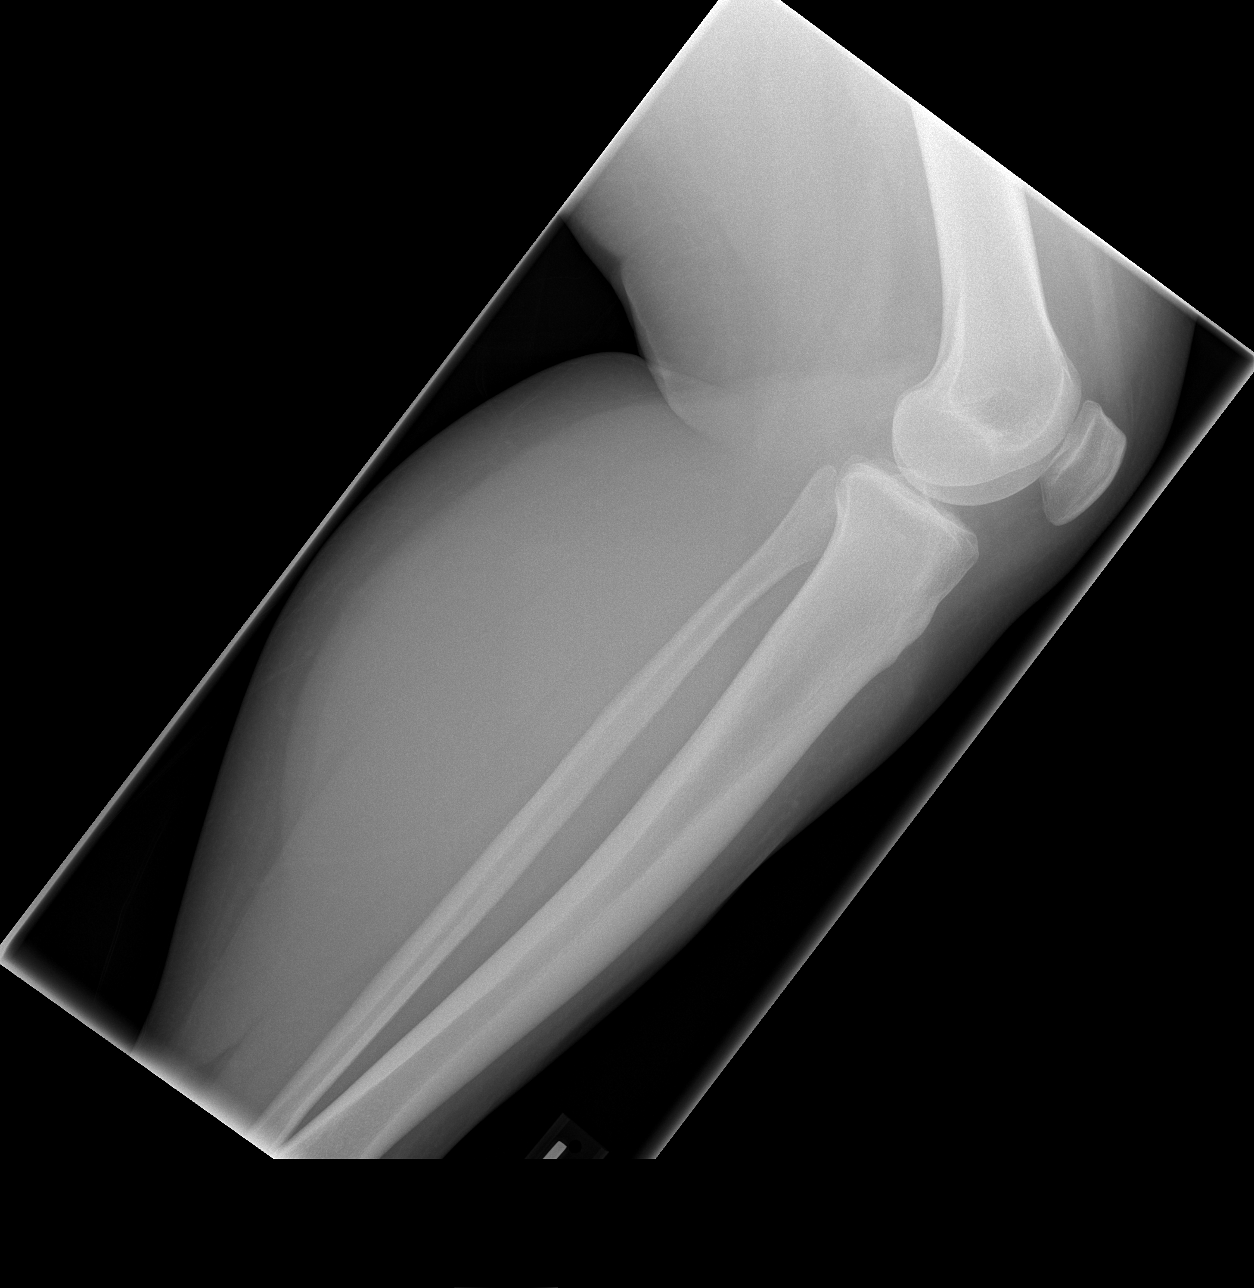

[4 of 4 positions shown; findings below may reference images not displayed]

FINDINGS: Negative for a fracture or dislocation. Soft tissues in left lower
leg are grossly normal. There appears to be a prominent ossicle at
the base of the fifth metatarsal bone.
IMPRESSION: No acute bone abnormality involving the left tibia or fibula.

## 2016-08-14 DIAGNOSIS — F314 Bipolar disorder, current episode depressed, severe, without psychotic features: Secondary | ICD-10-CM | POA: Diagnosis not present

## 2016-11-13 DIAGNOSIS — F17200 Nicotine dependence, unspecified, uncomplicated: Secondary | ICD-10-CM | POA: Diagnosis not present

## 2016-11-13 DIAGNOSIS — G8929 Other chronic pain: Secondary | ICD-10-CM | POA: Diagnosis not present

## 2016-11-13 DIAGNOSIS — Z716 Tobacco abuse counseling: Secondary | ICD-10-CM | POA: Diagnosis not present

## 2016-11-13 DIAGNOSIS — M25561 Pain in right knee: Secondary | ICD-10-CM | POA: Diagnosis not present

## 2016-11-14 DIAGNOSIS — G8929 Other chronic pain: Secondary | ICD-10-CM | POA: Diagnosis not present

## 2016-11-14 DIAGNOSIS — R110 Nausea: Secondary | ICD-10-CM | POA: Diagnosis not present

## 2016-11-14 DIAGNOSIS — F17200 Nicotine dependence, unspecified, uncomplicated: Secondary | ICD-10-CM | POA: Diagnosis not present

## 2016-11-14 DIAGNOSIS — Z716 Tobacco abuse counseling: Secondary | ICD-10-CM | POA: Diagnosis not present

## 2016-11-15 DIAGNOSIS — F17200 Nicotine dependence, unspecified, uncomplicated: Secondary | ICD-10-CM | POA: Diagnosis not present

## 2016-11-15 DIAGNOSIS — R110 Nausea: Secondary | ICD-10-CM | POA: Diagnosis not present

## 2016-11-15 DIAGNOSIS — M545 Low back pain: Secondary | ICD-10-CM | POA: Diagnosis not present

## 2016-11-15 DIAGNOSIS — G8929 Other chronic pain: Secondary | ICD-10-CM | POA: Diagnosis not present

## 2016-11-16 DIAGNOSIS — F17200 Nicotine dependence, unspecified, uncomplicated: Secondary | ICD-10-CM | POA: Diagnosis not present

## 2016-11-16 DIAGNOSIS — M545 Low back pain: Secondary | ICD-10-CM | POA: Diagnosis not present

## 2016-11-16 DIAGNOSIS — G8929 Other chronic pain: Secondary | ICD-10-CM | POA: Diagnosis not present

## 2016-11-16 DIAGNOSIS — R110 Nausea: Secondary | ICD-10-CM | POA: Diagnosis not present

## 2016-11-20 DIAGNOSIS — M25561 Pain in right knee: Secondary | ICD-10-CM | POA: Diagnosis not present

## 2016-11-20 DIAGNOSIS — L559 Sunburn, unspecified: Secondary | ICD-10-CM | POA: Diagnosis not present

## 2017-02-16 DIAGNOSIS — I10 Essential (primary) hypertension: Secondary | ICD-10-CM | POA: Diagnosis not present

## 2017-07-11 DIAGNOSIS — F17210 Nicotine dependence, cigarettes, uncomplicated: Secondary | ICD-10-CM | POA: Diagnosis not present

## 2017-07-11 DIAGNOSIS — I809 Phlebitis and thrombophlebitis of unspecified site: Secondary | ICD-10-CM | POA: Diagnosis not present

## 2017-07-11 DIAGNOSIS — Z79899 Other long term (current) drug therapy: Secondary | ICD-10-CM | POA: Diagnosis not present

## 2017-07-11 DIAGNOSIS — Z888 Allergy status to other drugs, medicaments and biological substances status: Secondary | ICD-10-CM | POA: Diagnosis not present

## 2017-07-11 DIAGNOSIS — M79621 Pain in right upper arm: Secondary | ICD-10-CM | POA: Diagnosis not present

## 2017-07-11 DIAGNOSIS — F3130 Bipolar disorder, current episode depressed, mild or moderate severity, unspecified: Secondary | ICD-10-CM | POA: Diagnosis not present

## 2017-07-11 DIAGNOSIS — M7989 Other specified soft tissue disorders: Secondary | ICD-10-CM | POA: Diagnosis not present

## 2017-07-11 DIAGNOSIS — L03113 Cellulitis of right upper limb: Secondary | ICD-10-CM | POA: Diagnosis not present

## 2017-07-11 DIAGNOSIS — Z793 Long term (current) use of hormonal contraceptives: Secondary | ICD-10-CM | POA: Diagnosis not present

## 2017-07-11 DIAGNOSIS — I808 Phlebitis and thrombophlebitis of other sites: Secondary | ICD-10-CM | POA: Diagnosis not present

## 2017-10-02 DIAGNOSIS — K0889 Other specified disorders of teeth and supporting structures: Secondary | ICD-10-CM | POA: Diagnosis not present

## 2017-10-02 DIAGNOSIS — Z888 Allergy status to other drugs, medicaments and biological substances status: Secondary | ICD-10-CM | POA: Diagnosis not present

## 2017-10-02 DIAGNOSIS — K029 Dental caries, unspecified: Secondary | ICD-10-CM | POA: Diagnosis not present

## 2017-10-02 DIAGNOSIS — K089 Disorder of teeth and supporting structures, unspecified: Secondary | ICD-10-CM | POA: Diagnosis not present

## 2017-10-02 DIAGNOSIS — F17210 Nicotine dependence, cigarettes, uncomplicated: Secondary | ICD-10-CM | POA: Diagnosis not present

## 2018-02-06 DIAGNOSIS — M549 Dorsalgia, unspecified: Secondary | ICD-10-CM | POA: Diagnosis not present

## 2018-02-06 DIAGNOSIS — F329 Major depressive disorder, single episode, unspecified: Secondary | ICD-10-CM | POA: Diagnosis not present

## 2018-02-06 DIAGNOSIS — F419 Anxiety disorder, unspecified: Secondary | ICD-10-CM | POA: Diagnosis not present

## 2018-02-06 DIAGNOSIS — F17210 Nicotine dependence, cigarettes, uncomplicated: Secondary | ICD-10-CM | POA: Diagnosis not present

## 2018-02-06 DIAGNOSIS — R1084 Generalized abdominal pain: Secondary | ICD-10-CM | POA: Diagnosis not present

## 2018-03-01 ENCOUNTER — Telehealth: Payer: Self-pay

## 2018-03-01 NOTE — Telephone Encounter (Signed)
Requesting OUD appt. Please call back at [phone] or [phone].

## 2018-03-01 NOTE — Telephone Encounter (Signed)
Will plan to touch base with her next week.  [name] normally makes these calls for follow up appointments.  Thank you!

## 2018-03-04 NOTE — Telephone Encounter (Signed)
Ok by [facility] for [facility] tomorrow [facility]. If she can't make that, next available would be 8/22.

## 2018-03-04 NOTE — Telephone Encounter (Signed)
Pt called back, she states she has been out of rehab for 2 weeks, was there for 87 days, disch on subzolv, will run out in appr 4 days Started out at age 27 on pills and started at heroin at [age], appr 5 yrs of use injecting, does not use marijuana or meth Would like to be seen asap in oud, she is fiancee of another oud pt

## 2018-03-05 ENCOUNTER — Ambulatory Visit (INDEPENDENT_AMBULATORY_CARE_PROVIDER_SITE_OTHER): Payer: Medicare Other | Admitting: Internal Medicine

## 2018-03-05 ENCOUNTER — Telehealth: Payer: Self-pay | Admitting: *Deleted

## 2018-03-05 ENCOUNTER — Other Ambulatory Visit: Payer: Self-pay

## 2018-03-05 VITALS — BP 158/78 | HR 114 | Temp 99.0°F | Ht 60.0 in | Wt 220.2 lb

## 2018-03-05 DIAGNOSIS — F1199 Opioid use, unspecified with unspecified opioid-induced disorder: Secondary | ICD-10-CM | POA: Insufficient documentation

## 2018-03-05 DIAGNOSIS — F1120 Opioid dependence, uncomplicated: Secondary | ICD-10-CM

## 2018-03-05 DIAGNOSIS — F17210 Nicotine dependence, cigarettes, uncomplicated: Secondary | ICD-10-CM

## 2018-03-05 DIAGNOSIS — F319 Bipolar disorder, unspecified: Secondary | ICD-10-CM | POA: Diagnosis not present

## 2018-03-05 DIAGNOSIS — F4310 Post-traumatic stress disorder, unspecified: Secondary | ICD-10-CM

## 2018-03-05 DIAGNOSIS — F4312 Post-traumatic stress disorder, chronic: Secondary | ICD-10-CM | POA: Insufficient documentation

## 2018-03-05 DIAGNOSIS — Z114 Encounter for screening for human immunodeficiency virus [HIV]: Secondary | ICD-10-CM

## 2018-03-05 DIAGNOSIS — F1190 Opioid use, unspecified, uncomplicated: Secondary | ICD-10-CM | POA: Insufficient documentation

## 2018-03-05 DIAGNOSIS — Z79899 Other long term (current) drug therapy: Secondary | ICD-10-CM | POA: Diagnosis not present

## 2018-03-05 MED ORDER — CLONIDINE HCL 0.2 MG PO TABS: 0.2000 mg | ORAL_TABLET | Freq: Three times a day (TID) | ORAL | 0 refills | Status: DC

## 2018-03-05 MED ORDER — LAMOTRIGINE 100 MG PO TABS: 100.0000 mg | ORAL_TABLET | Freq: Every day | ORAL | 0 refills | Status: DC

## 2018-03-05 MED ORDER — DOXEPIN HCL 75 MG PO CAPS: 75.0000 mg | ORAL_CAPSULE | Freq: Every day | ORAL | 0 refills | Status: DC

## 2018-03-05 MED ORDER — PRAZOSIN HCL 2 MG PO CAPS: 2.0000 mg | ORAL_CAPSULE | Freq: Every day | ORAL | 0 refills | Status: DC

## 2018-03-05 MED ORDER — BUPRENORPHINE HCL-NALOXONE HCL 8.6-2.1 MG SL SUBL: 1.0000 | SUBLINGUAL_TABLET | Freq: Two times a day (BID) | SUBLINGUAL | 0 refills | Status: DC

## 2018-03-05 NOTE — Assessment & Plan Note (Addendum)
The patient presents after being discharged from a treatment facility in [place] that she has been treated for her OUD disorder for the past 3 months. The patient stated that the program was very helpful for her and she was discharged 2 weeks ago with a one month's prescription of zubsolv 8.6mg  tid. The patient's las heroin use was in May 2019. The patient has not had any relapses or cravings.   The patient mentions that she is very motivated to remain away from ivdu. Her stressors are arguments with her friends and family.   Assessment and plan  Discussed the patient's zubsolv frequency and she stated that she would like to decrease the frequency to twice daily from previous three times daily. Obtained HIV, hep c, cmp labs, and toxassure. Will follow up with the patient in 1 week during which time will get records release consent from her to get her records [facility].

## 2018-03-05 NOTE — Assessment & Plan Note (Addendum)
The patient states that she has a history of bipolar disorder. She has previously been treated for behavioral problems at different centers, but does not have a psychiatrist currently.   Assessment and plan  -Refilled clonidine 0.2mg  tid, doxepin 75mg  qd, prazosin 2mg  qhs, and lamictal 100mg  qd -Encouraged the patient to find a local psychiatrist

## 2018-03-05 NOTE — Patient Instructions (Addendum)
It was a pleasure to see you today Ms. Stettler. Please make the following changes:  -Please take Zubsolv 8.6mg  twice daily  -Follow up in 1 week  If you have any questions or concerns, please call our clinic at [phone] between 9am-5pm and after hours call [phone] and ask for the internal medicine resident on call. If you feel you are having a medical emergency please call 911.   Thank you, we look forward to help you remain healthy!  Lorenso CourierVahini Lindsee Labarre, MD Internal Medicine PGY2

## 2018-03-05 NOTE — Progress Notes (Signed)
03/05/2018  Sydney BoopSarah A Swider presents for buprenorphine/naloxone intake visit.   I have reviewed EPIC data including labwork which was available.  I have reviewed outside records provided by patient if available.  The salient points were confirmed with the patient.    Review of substance use history (first use, substances used, any illicit purchases): Patient started using oral pain medications (percocets and vicodin) at 27 years of age. She got access to the pain medication because she was prescribed it after hurting herself in a skiing accident.   At 20 yrs she started snorting heroin   At 22 yrs she started injecting heroin   Last substance used: IV Heroin May 9th 2019  If last substance not an opioid, last opioid used (type, dose, route, withdrawal symptoms): not applicable   Mental Health History: Anxiety, PTSD, Bipolar. [facility].   Current counseling/behavioural health provider: none  This patient has Opioid Use Disorder by following DSM-V criteria: Keep those that apply.  - Opioids taken in larger amounts or over a longer period than intended - Persistent desire to cut down - A great deal of time is spent to obtain/use/recover from the opioid - Cravings to use opioids - Use resulting in a failure to fulfill major role obligations - Continue opioid use despite persistent social or interpersonal problems - Important activities are given up or reduced because of opioid use - Recurrent opioid use in situations in which it is physically hazardous - Use despite knowledge of health problems caused by opioids - Tolerance - Withdrawal  Past Medical History:  Diagnosis Date  . Bipolar disorder   . Sinus tachycardia     Current Outpatient Medications on File Prior to Visit  Medication Sig Dispense Refill  . benzonatate (TESSALON) 100 MG capsule Take 1 capsule (100 mg total) by mouth every 8 (eight) hours. 21 capsule 0    . cyclobenzaprine (FLEXERIL) 10 MG tablet Take 1 tablet (10 mg total) by mouth 2 (two) times daily as needed for muscle spasms. 20 tablet 0  . etonogestrel (IMPLANON) 68 MG IMPL implant Inject 1 each into the skin once.    Marland Kitchen. FLUoxetine HCl (PROZAC PO) Take by mouth.    Marland Kitchen. ibuprofen (ADVIL,MOTRIN) 800 MG tablet Take 1 tablet (800 mg total) by mouth 3 (three) times daily. 21 tablet 0  . loperamide (IMODIUM) 2 MG capsule Take 1 capsule (2 mg total) by mouth 4 (four) times daily as needed for diarrhea or loose stools. 12 capsule 0  . Lurasidone HCl (LATUDA PO) Take by mouth.    Marland Kitchen. OVER THE COUNTER MEDICATION Take 2 tablets by mouth once. Medique - Back Pain off    . OxyCODONE HCl ER (OXYCONTIN) 30 MG T12A Take 1 tablet by mouth every 4 (four) hours as needed (for pain).     No current facility-administered medications on file prior to visit.     Physical Exam  Vitals:   03/05/18 1605  BP: (!) 158/78  Pulse: (!) 114  Temp: 99 F (37.2 C)  TempSrc: Oral  SpO2: 100%  Weight: 220 lb 3.2 oz (99.9 kg)  Height: 5' (1.524 m)   Physical Exam  Constitutional: She appears well-developed and well-nourished. She appears distressed.  HENT:  Head: Normocephalic and atraumatic.  Cardiovascular: Normal rate, regular rhythm and normal heart sounds.  Respiratory: Effort normal and breath sounds normal. No respiratory distress. She has no wheezes.  GI: Soft. Bowel sounds are normal. She  exhibits no distension. There is no tenderness.  Musculoskeletal: She exhibits no edema.  Neurological: She is alert.  Skin: She is not diaphoretic. No erythema.  Several areas of linear lacerations that have dealed over bilateral upper forearms  Psychiatric: She has a normal mood and affect. Her behavior is normal. Judgment and thought content normal.    Clinical Opiate Withdrawal Scale: bold applicable COWS scoring   - Resting HR:    - 0 for < 80   - 1 for 81 - 100   - 2 for 101 - 120   - 4 for > 120  -  Sweating:   - 0 for no chills/flushing   - 1 for subjective chills/flushing   - 3 for beads of sweat on brow/face   - 4 for sweat streaming off of face  - Restlessness:    - 0 for able to sit still   - 1 for subjective difficulty sitting still   - 3 for frequent shifting or extraneous movement   - 5 for unable to sit still for more than a few seconds  - Pupil size:    - 0 for pinpoint or normal   - 1 for possibly larger than normal   - 2 for moderately dilated   - 5 for only iris rim visible  - Bone/joint pain:    - 0 for not present   - 1 for mild diffuse discomfort   - 2 severe diffuse aching   - 4 for objectively rubbing joints/muscles and obviously in pain  - Runny nose/tearing:    - 0 for not present   - 1 for stuffy nose/moist eyes   - 2 for nose running/tearing   - 4 for nose constantly running or tears streaming down cheeks  - GI Upset:    - 0 for no GI symptoms   - 1 for stomach cramps   - 2 for nausea or loose stool   - 3 for vomiting or diarrhea   - 5 for multiple episodes of vomiting or diarrhea  - Tremor observation of outstretched hands:    - 0 for no tremor   - 1 for tremor can be felt but not observed   - 2 for slight tremor observable   - 4 for gross tremor or muscle twitching  - Yawning:    - 0 for no yawning   - 1 for yawning once or twice during assessment   - 2 for yawning three or more times during assessment   - 4 for yawning several times per minute  - Anxiety or irritability:    - 0 for none   - 1 for patient reports increasing irritability or anxiousness   - 2 for patient obviously irritable/anxious   - 4 for patient so irritable/anxious that assessment is difficult  - Gooseflesh:    - 0 for skin is smooth   - 3 for piloerection of skin can be felt or seen   - 5 for prominent piloerection  TOTAL: 11  Assessment/Plan:   Based on a review of the patient's medical history including substance use and mental health factors, and physical  exam, [name] is a suitable candidate for MAT with buprenorphine/naloxone.  UDS ordered this visit.    I have discussed HIV and Hepatitis C screening with this patient.    I will place orders for CMET for baseline LFT evaluation if needed.    Home Induction:   I have instructed  the patient how to appropriately take this medication, including placing under the tongue with head relaxed for 10 minutes and allowing to dissolve without chewing or swallowing tab/film, and with nothing to eat or drink in the subsequent 15 minutes.  They have been told not to start taking the medication until they have significant signs of withdrawal and I have explained the concept of precipitated withdrawal with the patient.    Intervisit Care:  I will call the patient the morning after induction to assess symptom burden and determine the need for additional dose titration.  We discussed this medication must be kept in a safe place and away from children.   We will see the patient back in 1 week in clinic, with options for a sooner appointment based on patient and provider preference.   Patient was encouraged to call the office and speak with the MD on call for any urgent concerns.    Lorenso Courier, MD 03/05/2018 4:12 PM

## 2018-03-05 NOTE — Telephone Encounter (Signed)
Done. Thanks.

## 2018-03-05 NOTE — Telephone Encounter (Signed)
Please change medlist to clonidine 0.2mg  #90 from #30. thanks

## 2018-03-06 ENCOUNTER — Telehealth: Payer: Self-pay | Admitting: *Deleted

## 2018-03-06 LAB — CMP14 + ANION GAP
A/G RATIO: 1.6 (ref 1.2–2.2)
ALT: 39 IU/L — ABNORMAL HIGH (ref 0–32)
AST: 46 IU/L — AB (ref 0–40)
Albumin: 5.2 g/dL (ref 3.5–5.5)
Alkaline Phosphatase: 74 IU/L (ref 39–117)
Anion Gap: 16 mmol/L (ref 10.0–18.0)
BILIRUBIN TOTAL: 0.2 mg/dL (ref 0.0–1.2)
BUN/Creatinine Ratio: 17 (ref 9–23)
BUN: 11 mg/dL (ref 6–20)
CHLORIDE: 102 mmol/L (ref 96–106)
CO2: 24 mmol/L (ref 20–29)
Calcium: 10 mg/dL (ref 8.7–10.2)
Creatinine, Ser: 0.64 mg/dL (ref 0.57–1.00)
GFR calc non Af Amer: 122 mL/min/{1.73_m2} (ref >59)
GFR, EST AFRICAN AMERICAN: 140 mL/min/{1.73_m2} (ref >59)
GLUCOSE: 90 mg/dL (ref 65–99)
Globulin, Total: 3.3 g/dL (ref 1.5–4.5)
POTASSIUM: 4.6 mmol/L (ref 3.5–5.2)
Sodium: 142 mmol/L (ref 134–144)
TOTAL PROTEIN: 8.5 g/dL (ref 6.0–8.5)

## 2018-03-06 LAB — HIV ANTIBODY (ROUTINE TESTING W REFLEX): HIV Screen 4th Generation wRfx: NONREACTIVE

## 2018-03-06 LAB — HEPATITIS C ANTIBODY: Hep C Virus Ab: >11 s/co ratio — ABNORMAL HIGH (ref 0.0–0.9)

## 2018-03-06 NOTE — Telephone Encounter (Signed)
Pt calls and states pharm did not get the [employer] script, called pharm spoke to pharmacist, the med is on order and will be there tomorrow, attempted to call pt back, lm for rtc

## 2018-03-06 NOTE — Telephone Encounter (Signed)
Spoke w/ pt, she has enough tablets to last until tomorrow, she will check w/ the pharm again this pm and in the am for planned arrival of med

## 2018-03-07 ENCOUNTER — Encounter: Payer: Self-pay | Admitting: Internal Medicine

## 2018-03-07 NOTE — Progress Notes (Signed)
Internal Medicine Clinic Attending  I saw and evaluated the patient.  I personally confirmed the key portions of the history and exam documented by Dr. Delma Officerhundi and I reviewed pertinent patient test results.  The assessment, diagnosis, and plan were formulated together and I agree with the documentation in the resident's note.  27 year old person who injects drugs on treatment with Zubsolv after a recent 2 month residential rehab stay in [place]. She presents today to transfer care for OUD to our clinic, as [place] permanently in [place]. She has had recent relapses, occasionally still injecting heroin. She is engaged to a partner who also uses drugs and is currently in outpatient treatment. They have one daughter, no other significant family history. I think she is a good candidate to continue with treatment of OUD, I reviewed the PRMP which is appropriate. We will check labs and urine substance test today, provide short 1 week supply to start with. If labs look ok, we can sign a treatment agreement at next visit. Also I will ask her to sign a release of information so we can obtain records from previous MAT provider.   HCV antibody is positive. At next visit we will check HCV RNA quant, genotype and other viral hepatitis serologies.

## 2018-03-07 NOTE — Addendum Note (Signed)
Addended by: Erlinda HongVINCENT, DUNCAN T on: 03/07/2018 09:02 AM   Modules accepted: Level of Service

## 2018-03-10 LAB — TOXASSURE SELECT,+ANTIDEPR,UR

## 2018-03-14 ENCOUNTER — Other Ambulatory Visit: Payer: Self-pay

## 2018-03-14 ENCOUNTER — Ambulatory Visit (INDEPENDENT_AMBULATORY_CARE_PROVIDER_SITE_OTHER): Payer: Medicare Other | Admitting: Internal Medicine

## 2018-03-14 VITALS — BP 103/60 | HR 83 | Temp 98.4°F | Ht 60.0 in | Wt 233.9 lb

## 2018-03-14 DIAGNOSIS — Z79899 Other long term (current) drug therapy: Secondary | ICD-10-CM

## 2018-03-14 DIAGNOSIS — F419 Anxiety disorder, unspecified: Secondary | ICD-10-CM | POA: Diagnosis not present

## 2018-03-14 DIAGNOSIS — F17210 Nicotine dependence, cigarettes, uncomplicated: Secondary | ICD-10-CM | POA: Diagnosis not present

## 2018-03-14 DIAGNOSIS — G4700 Insomnia, unspecified: Secondary | ICD-10-CM

## 2018-03-14 DIAGNOSIS — F319 Bipolar disorder, unspecified: Secondary | ICD-10-CM

## 2018-03-14 DIAGNOSIS — F1120 Opioid dependence, uncomplicated: Secondary | ICD-10-CM

## 2018-03-14 DIAGNOSIS — F4310 Post-traumatic stress disorder, unspecified: Secondary | ICD-10-CM

## 2018-03-14 DIAGNOSIS — R768 Other specified abnormal immunological findings in serum: Secondary | ICD-10-CM | POA: Diagnosis not present

## 2018-03-14 DIAGNOSIS — F1190 Opioid use, unspecified, uncomplicated: Secondary | ICD-10-CM

## 2018-03-14 DIAGNOSIS — F1199 Opioid use, unspecified with unspecified opioid-induced disorder: Secondary | ICD-10-CM

## 2018-03-14 MED ORDER — ZOLPIDEM TARTRATE 10 MG PO TABS: 10.0000 mg | ORAL_TABLET | Freq: Every evening | ORAL | 0 refills | Status: DC | PRN

## 2018-03-14 MED ORDER — BUPRENORPHINE HCL-NALOXONE HCL 8.6-2.1 MG SL SUBL: 1.0000 | SUBLINGUAL_TABLET | Freq: Two times a day (BID) | SUBLINGUAL | 0 refills | Status: DC

## 2018-03-14 NOTE — Patient Instructions (Signed)
[name] - -  It was a pleasure to meet you.   Please try ambien for your sleep.  You will need to have a good birth control plan as ambien cannot be used in pregnancy.   Please come back on Tuesday August 27 for your next appointment.    Thank you!

## 2018-03-14 NOTE — Progress Notes (Signed)
   03/14/2018  Sydney BoopSarah A [name] presents for follow up of opioid use disorder I have reviewed the prior induction visit, follow up visits, and telephone encounters relevant to opiate use disorder (OUD) treatment.   Current daily dose: Zubsolv 8.6mg  BID  Date of Induction: in our clinic 03/05/18  Current follow up interval, in weeks: 1 week  The patient has been adherent with the buprenorphine for OUD contract.   Last UDS Result: appropriate   HPI: [name] is a 27yo woman with PMH OUD here for treatment.  [name] has significant mental health diagnoses that make her sobriety difficult.  She has PTSD, depression and anxiety.  She has been on a combination of lamictal, doxepin, clonidine and prazosin and has had only minimal relief from insomnia and anxiety.  She was previously treated with Remus Lofflerambien for insomnia and feels this worked much better for her.  She has self stopped her doxepin (did not wean), clonidine and prazosin about 1 week ago and thankfully has not had any side effects.  She is willing to go back to psychiatry and has insurance.  She notes that her cravings are okay at current dose of Zubsolv and she has had no illicit substance use.  She has HCV antibody positive and will be confirmed with testing today.  She would benefit from telepsych as well.   Exam:   Vitals:   03/14/18 1048  BP: 103/60  Pulse: 83  Temp: 98.4 F (36.9 C)  TempSrc: Oral  SpO2: 100%  Weight: 233 lb 14.4 oz (106.1 kg)  Height: 5' (1.524 m)    General: Awake, alert, anxious appearing Eyes: Anicteric sclerae, no conjunctival injection Skin: Multiple old cutting marks on arms Psych: Very anxious, worried about her care.  Assessment/Plan:  See Problem Based Charting in the Encounters Tab     Inez CatalinaMullen, Ajani Schnieders B, MD  03/14/2018  11:14 AM

## 2018-03-15 DIAGNOSIS — B182 Chronic viral hepatitis C: Secondary | ICD-10-CM | POA: Insufficient documentation

## 2018-03-15 LAB — HEPATITIS B SURFACE ANTIBODY,QUALITATIVE: Hep B Surface Ab, Qual: REACTIVE

## 2018-03-15 LAB — HEPATITIS B CORE ANTIBODY, TOTAL: Hep B Core Total Ab: NEGATIVE

## 2018-03-15 LAB — HEPATITIS A ANTIBODY, TOTAL: Hep A Total Ab: NEGATIVE

## 2018-03-15 LAB — HEPATITIS B SURFACE ANTIGEN: HEP B S AG: NEGATIVE

## 2018-03-15 NOTE — Assessment & Plan Note (Signed)
Will check HCV RNA, HCV genotype, HAV and HBV today.    If confirms positive, will discuss treatment.

## 2018-03-15 NOTE — Assessment & Plan Note (Signed)
This issue along with bipolar is limiting her sobriety.  She is on lamictal.  I believe her mental health care treatment is beyond our scope and we will refer to psychiatry.  At next visit, will discuss telepsychiatry with her to bridge the gap before appointment.   Plan Continue lamictal 5 day trial of ambien for sleep, not a chronic Rx which I discussed with [name].

## 2018-03-15 NOTE — Assessment & Plan Note (Signed)
She is doing okay, cravings are somewhat controlled. Mental health is playing a large part in her difficulties.   Utox at next visit Follow up in 1 week Continue high dose zubsolv BID.

## 2018-03-17 LAB — HCV RNA QUANT
HCV log10: 5.316 log10 IU/mL
Hepatitis C Quantitation: 207000 IU/mL

## 2018-03-17 LAB — HEPATITIS C GENOTYPE

## 2018-03-19 ENCOUNTER — Telehealth: Payer: Self-pay

## 2018-03-19 ENCOUNTER — Ambulatory Visit (INDEPENDENT_AMBULATORY_CARE_PROVIDER_SITE_OTHER): Payer: Medicare Other | Admitting: Internal Medicine

## 2018-03-19 ENCOUNTER — Telehealth (HOSPITAL_COMMUNITY): Payer: Self-pay

## 2018-03-19 DIAGNOSIS — F1120 Opioid dependence, uncomplicated: Secondary | ICD-10-CM | POA: Diagnosis present

## 2018-03-19 DIAGNOSIS — F1190 Opioid use, unspecified, uncomplicated: Secondary | ICD-10-CM

## 2018-03-19 DIAGNOSIS — R454 Irritability and anger: Secondary | ICD-10-CM

## 2018-03-19 DIAGNOSIS — F17210 Nicotine dependence, cigarettes, uncomplicated: Secondary | ICD-10-CM

## 2018-03-19 DIAGNOSIS — Z79899 Other long term (current) drug therapy: Secondary | ICD-10-CM

## 2018-03-19 DIAGNOSIS — F319 Bipolar disorder, unspecified: Secondary | ICD-10-CM | POA: Diagnosis not present

## 2018-03-19 DIAGNOSIS — R768 Other specified abnormal immunological findings in serum: Secondary | ICD-10-CM

## 2018-03-19 DIAGNOSIS — F419 Anxiety disorder, unspecified: Secondary | ICD-10-CM

## 2018-03-19 DIAGNOSIS — F1199 Opioid use, unspecified with unspecified opioid-induced disorder: Secondary | ICD-10-CM

## 2018-03-19 DIAGNOSIS — F4310 Post-traumatic stress disorder, unspecified: Secondary | ICD-10-CM | POA: Diagnosis not present

## 2018-03-19 MED ORDER — LORAZEPAM 1 MG PO TABS: 1.0000 mg | ORAL_TABLET | Freq: Two times a day (BID) | ORAL | 0 refills | Status: DC

## 2018-03-19 MED ORDER — BUPRENORPHINE HCL-NALOXONE HCL 8.6-2.1 MG SL SUBL: 1.0000 | SUBLINGUAL_TABLET | Freq: Two times a day (BID) | SUBLINGUAL | 0 refills | Status: DC

## 2018-03-19 MED ORDER — ZOLPIDEM TARTRATE 10 MG PO TABS: 10.0000 mg | ORAL_TABLET | Freq: Every evening | ORAL | 0 refills | Status: DC | PRN

## 2018-03-19 NOTE — Progress Notes (Signed)
   03/19/2018  [name] presents for follow up of opioid use disorder I have reviewed the prior induction visit, follow up visits, and telephone encounters relevant to opiate use disorder (OUD) treatment.   Current daily dose: Zubsolv 8.6mg  BID  Date of Induction: 03/05/18  Current follow up interval, in weeks: 1 week  The patient has been adherent with the buprenorphine for OUD contract.   Last UDS Result: pending  HPI: [name] is a 27yo woman with PMH of OUD and bipolar/PTSD/anxiety who presents today for follow up.  [name] reports that she is doing well off of narcotics.  She has remained clean since last seen and cravings are improved with the zubsolv.  She is sleeping better with Ambien, and now is sleeping 6-7 hours per night.  She does have racing thoughts, increased irritability and anxiety.  She was previously on ativan prior to going in to her residential treatment facility.  She feels like she gets very irritable with family and needs to be alone a lot.  She is very interested in seeing a psychiatrist and her management is outside the scope of this clinic.    I discussed psychiatry referral through virtual BH today.  Ava in that office helped me to schedule [name] with a psychiatry appointment tomorrow at 9am in [place].    Exam:   Vitals:   03/19/18 1110  BP: 136/86  Pulse: 96  Temp: 99.1 F (37.3 C)  TempSrc: Oral  SpO2: 100%  Weight: 233 lb 6.4 oz (105.9 kg)    Gen: Anxious, rapid speech Eyes: Anicteric sclerae Pulm: Breathing comfortably, no wheezing Psych: Anxious, unable to sit still, almost tearful.  No SI/HI reported.  Assessment/Plan:  See Problem Based Charting in the Encounters Tab     Inez CatalinaMullen, Emily B, MD  03/19/2018  11:42 AM

## 2018-03-19 NOTE — Telephone Encounter (Signed)
Per Dr. Vanetta ShawlHisada - the patient appt will need to be canceled and the writer will refer the patient to [facility] or [facility] for service

## 2018-03-19 NOTE — Assessment & Plan Note (Signed)
She is on lamictal which I suggested she continue.  2 doses of Ativan 1mg  to take over next 18-24 hours prior to her psychiatry visit.

## 2018-03-19 NOTE — Assessment & Plan Note (Signed)
Acute Psychiatry visit tomorrow at 9am.    Plan Follow up psychiatry recommendations

## 2018-03-19 NOTE — Assessment & Plan Note (Signed)
She seems to be stable from an opiate use perspective.  She is having acute psychiatric issues which exacerbate her opioid use.  We have been able to set her up with a psychiatry appointment tomorrow.   Plan Continue Zubsolv 8.6mg  BID PRMP appropriate UDS at next visit Return in 1 week for OUD appointment.

## 2018-03-19 NOTE — Patient Instructions (Addendum)
Please Go see Psychiatry tomorrow.   August 28 (Wednesday) 9am  Dr. Vanetta ShawlHisada  [address] [place], [place]  Phone #: [phone]  Please come back to see our clinic in 1 week.

## 2018-03-19 NOTE — Telephone Encounter (Signed)
[facility] - Patient was not able to leave a message in order to cancel her appt with Dr. Vanetta ShawlHIsada.  Writer will continue to try to contacct this patient.

## 2018-03-19 NOTE — Telephone Encounter (Signed)
Discussed with [name], [facility]. Per chart review, patient was seen at [facility] in the past. Diagnosis includes chronic opioid use disorder on  ZUBSOLV, cocaine use disorder, THC use disorder, borderline personality disorder and bipolar disorder.  She will need higher level of care given history of relapses and therapy for substance use. She will be referred to [facility] (and [facility] if the patient is interested).

## 2018-03-19 NOTE — Telephone Encounter (Signed)
[facility] - Writer discussed the case with Dr. Wilhelmenia BlaseMuller.  The patient has a diagnosis of Bipolar DisorderDisorder, PTSD,  Severe Anxiety and Substance Abuse.    The patient was recently discharged from a substance abuse facility two months ago.  The patient is not appropriate for [facility].  Therefore the patient was scheduled with a face to face appointment with Dr. Vanetta ShawlHisada on 03-20-2018 at 9 am  [address]  [place] [address] [phone]  Writer routed this information to Dr Vanetta ShawlHisada

## 2018-03-19 NOTE — Assessment & Plan Note (Signed)
We discussed her results from last visit.  Discuss treatment at next visit, she is amenable.

## 2018-03-20 ENCOUNTER — Telehealth (HOSPITAL_COMMUNITY): Payer: Self-pay | Admitting: Psychiatry

## 2018-03-20 ENCOUNTER — Ambulatory Visit (HOSPITAL_COMMUNITY): Payer: Self-pay | Admitting: Psychiatry

## 2018-03-20 NOTE — Telephone Encounter (Signed)
Patient showed up 20 mins late for the initial intake (although this appointment was cancelled yesterday, patient has not received notification as the staff was unable to reach the patient despite attempts). Given her late show for the appointment, she is unable to be seen today. Also discussed that the patient will be beneficial for treatment at [facility], where they provide structured treatment for substance use, including therapy. BH specialist, Ms. Elisabeth MostStevenson to contact the patient for further information. (no change in patient contact number: [phone]). Noted that patient was discharged from [facility] due to relapse per patient report. She asks lorazepam, ambien to be prescribed by this note Clinical research associatewriter; this request is declined as she will need evaluation for these medication to be prescribed. She is advised to contact the medicine clinic for further guidance. She is also informed of going to ED if any concern of withdrawal symptoms/safety concerns.

## 2018-03-20 NOTE — Telephone Encounter (Signed)
Thank you for the note!  Unfortunately, we do only opiate disorder related care.  It is beyond our scope to provide complicated psychiatric care (we are all IM trained).  I agree she would benefit from a higher level of care and will help her to organize this.   Thank you again!  Appreciate Ava being so quick to respond and help us yesterday!  Debe CoderEmily Chinaza Rooke, MD

## 2018-03-26 ENCOUNTER — Ambulatory Visit (INDEPENDENT_AMBULATORY_CARE_PROVIDER_SITE_OTHER): Payer: Medicare Other | Admitting: Student in an Organized Health Care Education/Training Program

## 2018-03-26 ENCOUNTER — Ambulatory Visit: Payer: Self-pay

## 2018-03-26 VITALS — BP 137/86 | HR 109 | Temp 99.4°F | Wt 223.1 lb

## 2018-03-26 DIAGNOSIS — F17210 Nicotine dependence, cigarettes, uncomplicated: Secondary | ICD-10-CM | POA: Diagnosis not present

## 2018-03-26 DIAGNOSIS — F1190 Opioid use, unspecified, uncomplicated: Secondary | ICD-10-CM

## 2018-03-26 DIAGNOSIS — F1120 Opioid dependence, uncomplicated: Secondary | ICD-10-CM

## 2018-03-26 DIAGNOSIS — R509 Fever, unspecified: Secondary | ICD-10-CM | POA: Insufficient documentation

## 2018-03-26 DIAGNOSIS — R634 Abnormal weight loss: Secondary | ICD-10-CM

## 2018-03-26 DIAGNOSIS — Z975 Presence of (intrauterine) contraceptive device: Secondary | ICD-10-CM

## 2018-03-26 DIAGNOSIS — M545 Low back pain: Secondary | ICD-10-CM | POA: Diagnosis not present

## 2018-03-26 DIAGNOSIS — B182 Chronic viral hepatitis C: Secondary | ICD-10-CM | POA: Diagnosis not present

## 2018-03-26 DIAGNOSIS — F1199 Opioid use, unspecified with unspecified opioid-induced disorder: Secondary | ICD-10-CM

## 2018-03-26 DIAGNOSIS — R768 Other specified abnormal immunological findings in serum: Secondary | ICD-10-CM

## 2018-03-26 DIAGNOSIS — Z6841 Body Mass Index (BMI) 40.0 and over, adult: Secondary | ICD-10-CM

## 2018-03-26 LAB — CBC WITH DIFFERENTIAL/PLATELET
Abs Immature Granulocytes: 0.1 10*3/uL (ref 0.0–0.1)
BASOS ABS: 0.1 10*3/uL (ref 0.0–0.1)
BASOS PCT: 1 %
EOS ABS: 0.1 10*3/uL (ref 0.0–0.7)
Eosinophils Relative: 1 %
HCT: 43 % (ref 36.0–46.0)
Hemoglobin: 14.1 g/dL (ref 12.0–15.0)
IMMATURE GRANULOCYTES: 1 %
Lymphocytes Relative: 27 %
Lymphs Abs: 3.9 10*3/uL (ref 0.7–4.0)
MCH: 27.7 pg (ref 26.0–34.0)
MCHC: 32.8 g/dL (ref 30.0–36.0)
MCV: 84.5 fL (ref 78.0–100.0)
Monocytes Absolute: 0.9 10*3/uL (ref 0.1–1.0)
Monocytes Relative: 7 %
NEUTROS PCT: 63 %
Neutro Abs: 9.2 10*3/uL — ABNORMAL HIGH (ref 1.7–7.7)
PLATELETS: 269 10*3/uL (ref 150–400)
RBC: 5.09 MIL/uL (ref 3.87–5.11)
RDW: 13.3 % (ref 11.5–15.5)
WBC: 14.3 10*3/uL — AB (ref 4.0–10.5)

## 2018-03-26 LAB — POCT URINE PREGNANCY: PREG TEST UR: NEGATIVE

## 2018-03-26 MED ORDER — BUPRENORPHINE HCL-NALOXONE HCL 8.6-2.1 MG SL SUBL: 1.0000 | SUBLINGUAL_TABLET | Freq: Two times a day (BID) | SUBLINGUAL | 0 refills | Status: DC

## 2018-03-26 NOTE — Progress Notes (Signed)
   03/26/2018  [name] presents for follow up of opioid use disorder I have reviewed the prior induction visit, follow up visits, and telephone encounters relevant to opiate use disorder (OUD) treatment.   Current daily dose: ZUBSOLV 8.6 mg twice daily  Date of Induction: 03/05/2018  Current follow up interval, in weeks: 1  The patient has been adherent with the buprenorphine for OUD contract.   Last UDS Result: Appropriate  HPI: 27 year old woman here for follow-up of opioid use disorder.  She reports good compliance with ZUBSOLV, cravings are well controlled, no recent relapses.  Last injection drug use reportedly was in May.  Does have an acute complaint today of low-grade fevers, over the last 2-3 weeks.  Says that she is been doing feeling hot, sweaty, significance malaise and fatigue as well.  Says she just does not feel well.  Denies any cough, no shortness of breath or chest pain.  Reports only a mild intermittent headache, nothing severe.  Some mild low back pain but not function limiting.  No joint pain or swelling.  No dysuria, burning, frequency.  Eating and drinking well, but feeling a little nauseous.  Reports an unintentional 10 pound weight loss over the last few weeks.  No skin or soft tissue infections.  No diarrhea or vomiting.  Reports her medications are stable, denies any recent injection drug use.  Denies any withdrawal from medications.  She does not think she can be pregnant, she has an Implanon in place.  She reports finishing a course of amoxicillin about 3 weeks ago, she was on this for dental infection.  Denies any pain in her mouth, no recent drainage.  Denies any sinus pain or drainage.  She has a significant psychiatric history as well, she followed up with behavioral health in [place].  She felt very judged at that visit, says it did not fit her well.  She also [facility] a bad experience with [facility], she was referred there because she was felt to be too high risk  for the [place] office.  She does not want a follow-up with [facility].  Exam:   Vitals:   03/26/18 1052  BP: 137/86  Pulse: (!) 109  Temp: 99.4 F (37.4 C)  TempSrc: Oral  Weight: 223 lb 1.6 oz (101.2 kg)    General: Tired appearing woman, no distress ENT: Multiple broken front teeth, diffuse dental caries, no redness at her gums and no drainage Neck: Mild thyromegaly, no nodules, no lymphadenopathy Heart: Regular rate and rhythm with no murmurs Lungs: Clear throughout no crackles Abdomen: Soft, nontender, nondistended Extremities are warm and well-perfused, no skin or soft tissue infections, no erythema, normal joints without effusions Neuro: Alert and oriented, conversational, normal strength in the upper and lower extremities, normal gait Psychiatric: Appropriate affect, mildly anxious appearing   Assessment/Plan:  See Problem Based Charting in the Encounters Tab    Tyson Alias, MD  03/26/2018  11:44 AM

## 2018-03-26 NOTE — Assessment & Plan Note (Signed)
Recent history of low-grade fever, malaise, fatigue of unclear etiology.  Last injection drug use reportedly was in May, nothing recent.  No other localizing symptoms, no symptoms to suggest pneumonia, URI, or UTI.  She has some mild lumbar back pain, not function limiting, seems low risk for osteomyelitis or discitis at this time.  Doubt pregnancy, she has an Implanon in place.  Doubt medication effect, no recent withdrawals of medication.  Will check blood work today, check CBC with differential, CMP, CRP, ESR, pregnancy test, and blood cultures.

## 2018-03-26 NOTE — Assessment & Plan Note (Signed)
Severe opioid use disorder, seems to be doing well.  Last urine substance test was appropriate.  I reviewed the prescriber database which was also appropriate.  Plan is to continue with ZUBSOLV 8.6 mg twice daily.  I prescribed a 2-week supply.  Follow-up with [place] in that time.  Check urine substance test today.

## 2018-03-26 NOTE — Patient Instructions (Signed)
We are going to check blood work today to look for a cause for your low-grade fevers and other symptoms.  Please continue with your other medications as usual.  Follow-up with [place] in 2 weeks.

## 2018-03-26 NOTE — Addendum Note (Signed)
Addended by: Bufford Spikes on: 03/26/2018 01:04 PM   Modules accepted: Orders

## 2018-03-26 NOTE — Assessment & Plan Note (Signed)
Chronic hepatitis C infection, genotype 2B, positive viral load.  Patient is known about it since at least May.  Small level of transaminitis but no other signs of cirrhosis.  She is immunized against hepatitis B. I think she is a good candidate for treatment for her chronic hepatitis C.  She is doing well with treatment for opioid use disorder over the last 4 months.  We will plan to check a fibro-sure score today from her serum, then will apply for coverage through her insurance.

## 2018-03-31 LAB — TOXASSURE SELECT,+ANTIDEPR,UR

## 2018-04-01 LAB — CULTURE, BLOOD (SINGLE)

## 2018-04-02 ENCOUNTER — Other Ambulatory Visit: Payer: Self-pay

## 2018-04-02 NOTE — Telephone Encounter (Signed)
Requesting all meds to be filled @  [id number] - [place], [place] - [address] AT [facility] [phone] (Phone) [phone] (Fax)

## 2018-04-02 NOTE — Telephone Encounter (Signed)
Also need refill Prazosin 2 mg and Ambien 10 mg.  Stated we have been refilling meds until she findsPCP.

## 2018-04-02 NOTE — Telephone Encounter (Signed)
No meds listed!  Thanks

## 2018-04-03 MED ORDER — LAMOTRIGINE 100 MG PO TABS: 100.0000 mg | ORAL_TABLET | Freq: Every day | ORAL | 0 refills | Status: DC

## 2018-04-03 NOTE — Telephone Encounter (Signed)
At last visit that I saw her, she told me she was not taking Prazosin.  This medication is not something I am comfortable restarting for her.  She will need a psychiatrist to fill for her.   Ambien was only as a trial (5 day supply) and not meant to be a chronic medication.   I will fill her lamictal.   She needs to make contact and establish with a psychiatrist.  Our clinic is not set up to handle acute or chronic psychiatric conditions beyond simple anxiety and depression, and we are not trained psychiatrists.  Her psych meds which she informed me she was taking (only lamictal) I will continue until she gets established with one.  She should try to establish with [facility].  We have also send a psych referral, have they been able to make an appointment for her?   Thanks!

## 2018-04-04 NOTE — Telephone Encounter (Signed)
I asked Lela S(referral coordinator) to f/u on referral.

## 2018-04-09 ENCOUNTER — Ambulatory Visit (INDEPENDENT_AMBULATORY_CARE_PROVIDER_SITE_OTHER): Payer: Medicare Other | Admitting: Internal Medicine

## 2018-04-09 ENCOUNTER — Other Ambulatory Visit: Payer: Self-pay

## 2018-04-09 VITALS — BP 126/81 | HR 88 | Temp 98.1°F | Ht 60.0 in | Wt 221.7 lb

## 2018-04-09 DIAGNOSIS — F319 Bipolar disorder, unspecified: Secondary | ICD-10-CM | POA: Diagnosis not present

## 2018-04-09 DIAGNOSIS — F1190 Opioid use, unspecified, uncomplicated: Secondary | ICD-10-CM

## 2018-04-09 DIAGNOSIS — F17210 Nicotine dependence, cigarettes, uncomplicated: Secondary | ICD-10-CM | POA: Diagnosis not present

## 2018-04-09 DIAGNOSIS — B182 Chronic viral hepatitis C: Secondary | ICD-10-CM | POA: Diagnosis not present

## 2018-04-09 DIAGNOSIS — F1199 Opioid use, unspecified with unspecified opioid-induced disorder: Secondary | ICD-10-CM

## 2018-04-09 MED ORDER — CLONIDINE HCL 0.2 MG PO TABS: 0.2000 mg | ORAL_TABLET | Freq: Three times a day (TID) | ORAL | 0 refills | Status: DC

## 2018-04-09 MED ORDER — ZOLPIDEM TARTRATE 10 MG PO TABS: 10.0000 mg | ORAL_TABLET | Freq: Every evening | ORAL | 0 refills | Status: DC | PRN

## 2018-04-09 MED ORDER — BUPRENORPHINE HCL-NALOXONE HCL 8.6-2.1 MG SL SUBL: 1.0000 | SUBLINGUAL_TABLET | Freq: Two times a day (BID) | SUBLINGUAL | 0 refills | Status: DC

## 2018-04-10 NOTE — Progress Notes (Signed)
   04/10/2018  [name] presents for follow up of opioid use disorder I have reviewed the prior induction visit, follow up visits, and telephone encounters relevant to opiate use disorder (OUD) treatment.   Current daily dose: ZUBSOLV 8.6 mg twice daily  Date of Induction: 03/05/2018  Current follow up interval, in weeks: 2  The patient has been adherent with the buprenorphine for OUD contract.   Last UDS Result: Appropriate  HPI: 27 year old woman here for follow-up of opioid use disorder.  She reports good compliance with ZUBSOLV, cravings are well controlled, no recent relapses.  Last injection drug use reportedly was in May.  She reports no major issues and feels things are overall going well.  She does have an intake visit with psychiatry for next week she plans to attend this but request refills of clonidine and ambien until she can establish with psychiatry.  Exam:   Vitals:   04/09/18 1046  BP: 126/81  Pulse: 88  Temp: 98.1 F (36.7 C)  TempSrc: Oral  SpO2: 100%  Weight: 221 lb 11.2 oz (100.6 kg)  Height: 5' (1.524 m)    General: NAD Heart: rrr Lungs: CTAB Abdomen: Soft, nontender, nondistended Extremities are warm and well-perfused, no skin or soft tissue infections, no erythema, normal joints without effusions, numerous healed scars from cutting on b/l upper extremities Neuro: Alert and oriented, conversational,  Psychiatric: Appropriate affect, mildly anxious appearing   Assessment/Plan:  See Problem Based Charting in the Encounters Tab    Sydney RungHoffman, Sydney C, DO  04/10/2018  1:44 PM

## 2018-04-10 NOTE — Assessment & Plan Note (Signed)
Reinforced that she does need to establish with psychaitry for ongoing refills of her psychatic medications but will provide limited Rx since she is making effort.

## 2018-04-10 NOTE — Assessment & Plan Note (Signed)
Will need to check fibrosure test at next visit, appears to be a good candidate for treatment in the near future as she is doing well with OUD therapy

## 2018-04-10 NOTE — Assessment & Plan Note (Signed)
Appears to be doing well with Zubsolv will continue 1 tablet BID, follow up in 2 weeks

## 2018-04-23 ENCOUNTER — Ambulatory Visit (INDEPENDENT_AMBULATORY_CARE_PROVIDER_SITE_OTHER): Payer: Medicare Other | Admitting: Student in an Organized Health Care Education/Training Program

## 2018-04-23 ENCOUNTER — Other Ambulatory Visit: Payer: Self-pay

## 2018-04-23 VITALS — BP 133/87 | HR 109 | Temp 98.6°F | Ht 60.0 in | Wt 224.3 lb

## 2018-04-23 DIAGNOSIS — B182 Chronic viral hepatitis C: Secondary | ICD-10-CM | POA: Diagnosis not present

## 2018-04-23 DIAGNOSIS — F17210 Nicotine dependence, cigarettes, uncomplicated: Secondary | ICD-10-CM | POA: Diagnosis not present

## 2018-04-23 DIAGNOSIS — F1199 Opioid use, unspecified with unspecified opioid-induced disorder: Secondary | ICD-10-CM

## 2018-04-23 DIAGNOSIS — Z79899 Other long term (current) drug therapy: Secondary | ICD-10-CM

## 2018-04-23 DIAGNOSIS — F419 Anxiety disorder, unspecified: Secondary | ICD-10-CM | POA: Diagnosis not present

## 2018-04-23 DIAGNOSIS — F1120 Opioid dependence, uncomplicated: Secondary | ICD-10-CM | POA: Diagnosis present

## 2018-04-23 DIAGNOSIS — F319 Bipolar disorder, unspecified: Secondary | ICD-10-CM | POA: Diagnosis not present

## 2018-04-23 DIAGNOSIS — F1190 Opioid use, unspecified, uncomplicated: Secondary | ICD-10-CM

## 2018-04-23 MED ORDER — LAMOTRIGINE 100 MG PO TABS: 100.0000 mg | ORAL_TABLET | Freq: Every day | ORAL | 0 refills | Status: DC

## 2018-04-23 MED ORDER — CLONIDINE HCL 0.2 MG PO TABS: 0.2000 mg | ORAL_TABLET | Freq: Three times a day (TID) | ORAL | 0 refills | Status: DC

## 2018-04-23 MED ORDER — BUPRENORPHINE HCL-NALOXONE HCL 8.6-2.1 MG SL SUBL: 1.0000 | SUBLINGUAL_TABLET | Freq: Two times a day (BID) | SUBLINGUAL | 0 refills | Status: DC

## 2018-04-23 MED ORDER — ZOLPIDEM TARTRATE 10 MG PO TABS: 10.0000 mg | ORAL_TABLET | Freq: Every evening | ORAL | 0 refills | Status: DC | PRN

## 2018-04-23 NOTE — Assessment & Plan Note (Signed)
Doing well on treatments.  Abstinence from injection drug use for about 6 months now.  Will check urine substance test today.  Plan is to continue ZUBSOLV 8.6 mg twice daily.  Follow-up with [place] in 2 weeks.  She is going to follow-up with psychiatry next week for treatment of her comorbid psychiatric diseases.

## 2018-04-23 NOTE — Assessment & Plan Note (Signed)
Stable affect.  Good functional status.  I refilled Lamictal 100 mg daily, clonidine 0.2 mg 3 times daily, and zolpidem.  Discontinue Lorazepam.  She is [facility].

## 2018-04-23 NOTE — Assessment & Plan Note (Signed)
Chronic hepatitis C with genotype daily, positive viral load, no signs of cirrhosis.  Good candidate for treatment.  Will check a serum fibro-sure today.  Then start the process for Mavyret approval.

## 2018-04-23 NOTE — Progress Notes (Signed)
   04/23/2018  [name] presents for follow up of opioid use disorder I have reviewed the prior induction visit, follow up visits, and telephone encounters relevant to opiate use disorder (OUD) treatment.   Current daily dose: Zubsolv 8.6mg  bid  Date of Induction: 03/05/18  Current follow up interval, in weeks: 2  The patient has been adherent with the buprenorphine for OUD contract.    HPI: 27 year old woman here for follow-up of opioid use disorder.  Doing well on treatment with ZUBSOLV.  Reports good compliance with the medication.  No recent relapses.  Has not used injection drugs in about 6 months.  She reports that her mood is stable.  Reports stable amount of anxiety, reports good compliance with her other bipolar medications.  Denies any fevers or chills.  Lives at home with her husband.  He was recently injured on the job.  He is also on treatment with Suboxone.  Exam:   Vitals:   04/23/18 1001  BP: 133/87  Pulse: (!) 109  Temp: 98.6 F (37 C)  TempSrc: Oral  SpO2: 99%  Weight: 224 lb 4.8 oz (101.7 kg)  Height: 5' (1.524 m)    General: Well-appearing woman, pacing the room, no distress CV: Tachycardic, regular, no murmurs Ext: Warm and well-perfused, no lower externally edema Skin: No rashes, no skin or soft tissue infections. Psych: Appropriate, mildly anxious appearing, fidgety  Assessment/Plan:  See Problem Based Charting in the Encounters Tab   Tyson Alias, MD  04/23/2018  10:26 AM

## 2018-04-23 NOTE — Patient Instructions (Signed)
Keep up the good work.  Take your medications as prescribed.  Follow-up with [place] in 2 weeks.  We will have the results from your hepatitis C test and can talk about how to get treatment.

## 2018-04-25 DIAGNOSIS — B182 Chronic viral hepatitis C: Secondary | ICD-10-CM | POA: Diagnosis not present

## 2018-04-25 LAB — HCV FIBROSURE
ALPHA 2-MACROGLOBULINS, QN: 268 mg/dL (ref 110–276)
ALT (SGPT) P5P: 26 IU/L (ref 0–40)
APOLIPOPROTEIN A I: 104 mg/dL — AB (ref 116–209)
Bilirubin, Total: 0.2 mg/dL (ref 0.0–1.2)
Fibrosis Score: 0.1 (ref 0.00–0.21)
GGT: 20 IU/L (ref 0–60)
HAPTOGLOBIN: 219 mg/dL — AB (ref 34–200)
Necroinflammat Activity Score: 0.09 (ref 0.00–0.17)

## 2018-04-28 LAB — TOXASSURE SELECT,+ANTIDEPR,UR

## 2018-04-29 ENCOUNTER — Other Ambulatory Visit: Payer: Self-pay | Admitting: Student in an Organized Health Care Education/Training Program

## 2018-04-30 ENCOUNTER — Other Ambulatory Visit: Payer: Self-pay | Admitting: Student in an Organized Health Care Education/Training Program

## 2018-04-30 DIAGNOSIS — Z124 Encounter for screening for malignant neoplasm of cervix: Secondary | ICD-10-CM

## 2018-05-07 ENCOUNTER — Ambulatory Visit (INDEPENDENT_AMBULATORY_CARE_PROVIDER_SITE_OTHER): Payer: Medicare Other | Admitting: Internal Medicine

## 2018-05-07 VITALS — BP 118/68 | HR 83 | Temp 99.1°F | Resp 20

## 2018-05-07 DIAGNOSIS — F1120 Opioid dependence, uncomplicated: Secondary | ICD-10-CM

## 2018-05-07 DIAGNOSIS — B182 Chronic viral hepatitis C: Secondary | ICD-10-CM | POA: Diagnosis not present

## 2018-05-07 DIAGNOSIS — F17210 Nicotine dependence, cigarettes, uncomplicated: Secondary | ICD-10-CM | POA: Diagnosis not present

## 2018-05-07 DIAGNOSIS — F1190 Opioid use, unspecified, uncomplicated: Secondary | ICD-10-CM

## 2018-05-07 DIAGNOSIS — F1199 Opioid use, unspecified with unspecified opioid-induced disorder: Secondary | ICD-10-CM

## 2018-05-07 MED ORDER — BUPRENORPHINE HCL-NALOXONE HCL 8.6-2.1 MG SL SUBL: 1.0000 | SUBLINGUAL_TABLET | Freq: Two times a day (BID) | SUBLINGUAL | 0 refills | Status: DC

## 2018-05-07 MED ORDER — GLECAPREVIR-PIBRENTASVIR 100-40 MG PO TABS: 3.0000 | ORAL_TABLET | Freq: Every day | ORAL | 1 refills | Status: DC

## 2018-05-07 NOTE — Patient Instructions (Signed)
[name], good to see you today.  I am glad everything is going so well.  We will refill your suboxone and see you again in about two weeks.

## 2018-05-07 NOTE — Assessment & Plan Note (Signed)
Has been doing well with suboxone, no withdrawal symptoms, no substance use.  Has been more productive, feels like she can face her issues without getting high.  Living with husband and daughter and no issues.  -continue zubsolv 8.6 BID -will extend follow up period to every month

## 2018-05-07 NOTE — Assessment & Plan Note (Addendum)
Chronic hepatitis C infection, genotype 2b with positive viral load as of 03/14/18. Fibrosure is F0 with no evident fibrosis. I think she is a good candidate for treatment. We talked about direct antiviral therapy. She is hepatitis B immune. I recommended Mavyret for 8 weeks, we talked about common side effects, importance of compliance, and I quoted her a 5% fail rate of treatment. I will prescribe mavyret now and work through the prior authorization process. The patient agrees and is excited to be treated.

## 2018-05-07 NOTE — Progress Notes (Signed)
   05/07/2018  Iva Boop presents for follow up of opioid use disorder I have reviewed the prior induction visit, follow up visits, and telephone encounters relevant to opiate use disorder (OUD) treatment.   Current daily dose: 8.6 BID  Date of Induction: 03/05/18  Current follow up interval, in weeks: 2, will switch to 4 today  The patient has been adherent with the buprenorphine for OUD contract.   Last UDS Result: appropriate  HPI:   Has been doing well with suboxone, no withdrawal symptoms, no substance use.  Has been more productive, feels like she can face her issues without getting high.  Living with husband and daughter and no issues.    Exam:   Vitals:   05/07/18 1120  BP: 118/68  Pulse: 83  Resp: 20  Temp: 99.1 F (37.3 C)  TempSrc: Oral  SpO2: 100%    Cardiac: normal rate and rhythm, clear s1 and s2 Pulmonary: CTAB, not in distress Abdominal: non distended abdomen, soft and nontender Extremities: no LE edema Psych: Alert, conversant, in good spirits   Assessment/Plan:  See Problem Based Charting in the Encounters Tab     Angelita Ingles, MD  05/07/2018  11:38 AM

## 2018-05-08 ENCOUNTER — Telehealth: Payer: Self-pay | Admitting: *Deleted

## 2018-05-08 NOTE — Addendum Note (Signed)
Addended by: Erlinda Hong T on: 05/08/2018 11:02 AM   Modules accepted: Level of Service

## 2018-05-08 NOTE — Telephone Encounter (Signed)
Call made to [facility] to follow up mavyret rx.  Per pharmacy-they are attempting to verify pt's insurance as there is none on file.  This process usually takes about 24-48 hours.  If a PA is needed, then [facility].Marland Kitchen  No further action needed at this time.Kingsley Spittle Cassady10/16/20192:57 PM

## 2018-05-08 NOTE — Progress Notes (Signed)
Internal Medicine Clinic Attending  I saw and evaluated the patient.  I personally confirmed the key portions of the history and exam documented by Dr. Winfrey and I reviewed pertinent patient test results.  The assessment, diagnosis, and plan were formulated together and I agree with the documentation in the resident's note. 

## 2018-05-15 ENCOUNTER — Telehealth: Payer: Self-pay | Admitting: *Deleted

## 2018-05-23 ENCOUNTER — Telehealth: Payer: Self-pay | Admitting: *Deleted

## 2018-05-23 NOTE — Telephone Encounter (Signed)
[facility] will need [facility] for [facility]

## 2018-05-23 NOTE — Telephone Encounter (Signed)
But of course. Happy to comply. Let me know when we have the paperwork together.

## 2018-05-28 NOTE — Addendum Note (Signed)
Addended by: Remus Blake on: 05/28/2018 10:53 AM   Modules accepted: Orders

## 2018-05-31 ENCOUNTER — Telehealth: Payer: Self-pay | Admitting: *Deleted

## 2018-05-31 NOTE — Telephone Encounter (Signed)
Information was sent to [facility]  for PA for Mavyret 100-40 MG .  Awaiting decision from [facility] .  Angelina Ok, RN 05/30/2018 9:15 AM . Information received from [facility] approved 05/30/2018 thru 07/25/2018.  Venita Sheffield Viola Kinnick,RN 05/31/2018 2:14 PM.

## 2018-06-04 ENCOUNTER — Ambulatory Visit (INDEPENDENT_AMBULATORY_CARE_PROVIDER_SITE_OTHER): Payer: Medicare Other | Admitting: Internal Medicine

## 2018-06-04 ENCOUNTER — Other Ambulatory Visit: Payer: Self-pay

## 2018-06-04 ENCOUNTER — Encounter: Payer: Self-pay | Admitting: Internal Medicine

## 2018-06-04 ENCOUNTER — Telehealth: Payer: Self-pay | Admitting: Licensed Clinical Social Worker

## 2018-06-04 VITALS — BP 139/76 | HR 140 | Temp 99.1°F | Ht 60.0 in | Wt 220.7 lb

## 2018-06-04 DIAGNOSIS — B182 Chronic viral hepatitis C: Secondary | ICD-10-CM | POA: Diagnosis not present

## 2018-06-04 DIAGNOSIS — F1120 Opioid dependence, uncomplicated: Secondary | ICD-10-CM

## 2018-06-04 DIAGNOSIS — Z3201 Encounter for pregnancy test, result positive: Secondary | ICD-10-CM | POA: Diagnosis not present

## 2018-06-04 DIAGNOSIS — Z975 Presence of (intrauterine) contraceptive device: Secondary | ICD-10-CM | POA: Diagnosis not present

## 2018-06-04 DIAGNOSIS — N926 Irregular menstruation, unspecified: Secondary | ICD-10-CM

## 2018-06-04 DIAGNOSIS — F1190 Opioid use, unspecified, uncomplicated: Secondary | ICD-10-CM

## 2018-06-04 DIAGNOSIS — F1199 Opioid use, unspecified with unspecified opioid-induced disorder: Secondary | ICD-10-CM

## 2018-06-04 DIAGNOSIS — F17210 Nicotine dependence, cigarettes, uncomplicated: Secondary | ICD-10-CM

## 2018-06-04 LAB — PREGNANCY, URINE: Preg Test, Ur: POSITIVE — AB

## 2018-06-04 NOTE — Patient Instructions (Addendum)
[name] - -  You are doing very well with your buprenorphine, please keep taking it as prescribed.   Your pregnancy test was positive.  Please make an appointment to see your OB as soon as possible.   Come back in 4 weeks.

## 2018-06-04 NOTE — Progress Notes (Signed)
   06/04/2018  [name] presents for follow up of opioid use disorder I have reviewed the prior induction visit, follow up visits, and telephone encounters relevant to opiate use disorder (OUD) treatment.   Current daily dose: Zubsolv 5.7 BID  Date of Induction: 03/05/18  Current follow up interval, in weeks: 4  The patient has been adherent with the buprenorphine for OUD contract.   Last UDS Result: as expected.  She is seeing a mental health provider who is prescribing her other medications  HPI: [name] is a 27yo woman with OUD who presents for buprenorphine treatment.  She is doing very well on treatment, in a stable home environment with daughter and husband.  She has not used any substances since last visit and is not suffering withdrawal or cravings.  She is very anxious today as she is concerned she may be pregnant.  She is normally very regular.  She is now 6 days late. She took a pregnancy test which has a very faint + line, however, the indication lines are not super clear.  She would like a confirmatory urine test here in clinic.  Her pulse is very high and she is pacing the room.  She reports that her pulse normally does this when she is anxious and she is not having chest pain.  She currently uses no birth control.  She has an implanon which is 29 years old and likely not effective.    Exam:   Vitals:   06/04/18 1103  BP: 139/76  Pulse: (!) 140  Temp: 99.1 F (37.3 C)  TempSrc: Oral  SpO2: 100%  Weight: 220 lb 11.2 oz (100.1 kg)  Height: 5' (1.524 m)    General: Pacing room, alert, anxious Eyes; Anicteric sclerae, no conjunctival injection CV: Regular, tachycardic, no murmur Skin: Clear, no wounds on exposed skin Psych: Somewhat pressured speech, anxious appearing.   Assessment/Plan:  See Problem Based Charting in the Encounters Tab  + Urine pregnancy test - We discussed implications of test and continuation of Zubsolv at this time - Phineas Semenshton, our mental  health counselor, called patient after visit and discussed options in the community for earlier appointments with OB.     Inez CatalinaMullen, Shaney Deckman B, MD  06/04/2018  11:20 AM

## 2018-06-04 NOTE — Telephone Encounter (Signed)
Patient was contacted to provided local resources for pregnancy centers in [place]. Patient plans to contact her current OBGYN office to schedule an appointment. Patient understands that she can visit with me in the office if she needs additional support.

## 2018-06-05 ENCOUNTER — Telehealth: Payer: Self-pay | Admitting: *Deleted

## 2018-06-05 NOTE — Assessment & Plan Note (Signed)
Well controlled at this time.  She reports no substance use and a stable home environmet.  She is using Zubsolv BID.  We did discuss remaining on this therapy while she was pregnant and I advised her to discuss risks/benefits with OB when she talks to them.    Plan Continue Zubsolv 5.7mg  BID [place] was reviewed and appropriate No U tox today Return in 4 weeks.

## 2018-06-05 NOTE — Assessment & Plan Note (Signed)
She has not started therapy yet, still interested in treatment, but distracted by other issues going on today.

## 2018-06-06 ENCOUNTER — Telehealth: Payer: Self-pay | Admitting: Licensed Clinical Social Worker

## 2018-06-06 NOTE — Telephone Encounter (Signed)
Patient was contacted to confirm that she was able to gain an appointment with her OBGYN.  A voicemail was left for the patient.

## 2018-06-07 ENCOUNTER — Telehealth: Payer: Self-pay | Admitting: *Deleted

## 2018-06-07 MED ORDER — BUPRENORPHINE HCL-NALOXONE HCL 8.6-2.1 MG SL SUBL: 1.0000 | SUBLINGUAL_TABLET | Freq: Two times a day (BID) | SUBLINGUAL | 0 refills | Status: DC

## 2018-06-07 NOTE — Telephone Encounter (Signed)
Patient given all information from Dr. Criselda PeachesMullen below. She is very Adult nurseappreciative. Kinnie FeilL. Kenyana Husak, RN, BSN

## 2018-06-07 NOTE — Telephone Encounter (Signed)
Buprenorphine yes.  She needs to speak to her psychiatrist at the [facility] for her other medications.  If she would like to be seen earlier, [facility].   Thanks

## 2018-06-07 NOTE — Telephone Encounter (Signed)
[facility]  Patient states that every OB office she has called states they will not see her till she is 10-[redacted] weeks pregnant. Patient wants to know if it is ok to continue her medication while she is pregnant. Kinnie FeilL. Cayson Kalb, RN, BSN

## 2018-06-07 NOTE — Telephone Encounter (Signed)
Pt needs refill on Suboxene [facility] [place], [phone]

## 2018-06-07 NOTE — Telephone Encounter (Signed)
Rx sent 

## 2018-06-15 ENCOUNTER — Other Ambulatory Visit: Payer: Self-pay | Admitting: Student in an Organized Health Care Education/Training Program

## 2018-06-17 NOTE — Telephone Encounter (Signed)
Clonidine in her case is for bipolar disorder, not managed by our office. I gave a 2 week supply 2 months ago to bridge her to psychiatry appointment. Either some one else is managing it, or she is not taking it anymore. I would defer refill of clonidine, as it is not a great option in pregnant women. Looks like she has an appt with OB on 12/4 which will help sort this out.

## 2018-06-25 ENCOUNTER — Other Ambulatory Visit: Payer: Self-pay | Admitting: *Deleted

## 2018-06-25 NOTE — Telephone Encounter (Signed)
error 

## 2018-06-26 ENCOUNTER — Other Ambulatory Visit (HOSPITAL_COMMUNITY)
Admission: RE | Admit: 2018-06-26 | Discharge: 2018-06-26 | Disposition: A | Discharge status: Home / Self Care | Payer: Medicare Other | Source: Ambulatory Visit | Attending: Adult Health | Admitting: Adult Health

## 2018-06-26 ENCOUNTER — Ambulatory Visit (INDEPENDENT_AMBULATORY_CARE_PROVIDER_SITE_OTHER): Payer: Medicare Other | Admitting: Adult Health

## 2018-06-26 ENCOUNTER — Telehealth: Payer: Self-pay | Admitting: Student in an Organized Health Care Education/Training Program

## 2018-06-26 ENCOUNTER — Encounter: Payer: Self-pay | Admitting: Adult Health

## 2018-06-26 VITALS — BP 123/84 | HR 94 | Ht 59.0 in | Wt 219.0 lb

## 2018-06-26 DIAGNOSIS — F1191 Opioid use, unspecified, in remission: Secondary | ICD-10-CM

## 2018-06-26 DIAGNOSIS — Z3A08 8 weeks gestation of pregnancy: Secondary | ICD-10-CM

## 2018-06-26 DIAGNOSIS — O3680X0 Pregnancy with inconclusive fetal viability, not applicable or unspecified: Secondary | ICD-10-CM | POA: Insufficient documentation

## 2018-06-26 DIAGNOSIS — L732 Hidradenitis suppurativa: Secondary | ICD-10-CM | POA: Insufficient documentation

## 2018-06-26 DIAGNOSIS — Z124 Encounter for screening for malignant neoplasm of cervix: Secondary | ICD-10-CM | POA: Insufficient documentation

## 2018-06-26 DIAGNOSIS — F3161 Bipolar disorder, current episode mixed, mild: Secondary | ICD-10-CM

## 2018-06-26 DIAGNOSIS — F17200 Nicotine dependence, unspecified, uncomplicated: Secondary | ICD-10-CM | POA: Insufficient documentation

## 2018-06-26 DIAGNOSIS — Z98891 History of uterine scar from previous surgery: Secondary | ICD-10-CM

## 2018-06-26 DIAGNOSIS — Z01419 Encounter for gynecological examination (general) (routine) without abnormal findings: Secondary | ICD-10-CM

## 2018-06-26 DIAGNOSIS — B1920 Unspecified viral hepatitis C without hepatic coma: Secondary | ICD-10-CM

## 2018-06-26 DIAGNOSIS — Z3201 Encounter for pregnancy test, result positive: Secondary | ICD-10-CM | POA: Diagnosis not present

## 2018-06-26 DIAGNOSIS — Z87898 Personal history of other specified conditions: Secondary | ICD-10-CM

## 2018-06-26 LAB — POCT URINE PREGNANCY: Preg Test, Ur: POSITIVE — AB

## 2018-06-26 NOTE — Telephone Encounter (Signed)
Called her about new pregnancy. I confirmed that she has not started Mavyret therapy, it has not been approved by insurance yet. We will dc the Mavyret, not approved in pregnancy, plan will be to revisit HCV treatment after delivery. I counseled her on the importance of continuing Zubsolv for now, we spoke about risk of neonatal withdrawal, but the risk of relapse if she stops the zubsolv is very high and will ultimately be more risk to the fetus.

## 2018-06-26 NOTE — Progress Notes (Signed)
Patient ID: [name], female   DOB: 08-04-1990, 27 y.o.   MRN: [id number] History of Present Illness: [name] is a 27 year old white female,single, engaged, in for well woman gyn exam and pap, and is early pregnant. She has a nexplanon that has been in over 8 years. She was diagnosed with Hepatitis C in August of this year, and is on Zubsilv, for history of Heroin use.She has mixed bipolar and used to be a cutter.  She was to start treatment for Hepatitis when found out she was pregnant. Has history of irregular periods.  PCP is Ines BloomerEmily Mullins.   Current Medications, Allergies, Past Medical History, Past Surgical History, Family History and Social History were reviewed in [facility] electronic medical record.     Review of Systems: Patient denies any headaches, hearing loss, fatigue, blurred vision, shortness of breath, chest pain, abdominal pain, problems with bowel movements, urination, or intercourse. No joint pain or mood swings. Hx irregular periods  [place]  [place] [address] [place].  Physical Exam:BP 123/84 (BP Location: Right Arm, Patient Position: Sitting, Cuff Size: Large)   Pulse 94   Ht 4\' 11"  (1.499 m)   Wt 219 lb (99.3 kg)   LMP 04/28/2018   BMI 44.23 kg/m  UPT+, about 8+3 weeks with EDD 02/03/19.  General:  Well developed, well nourished, no acute distress Skin:  Warm and dry,poor dental care,has scars on arms and legs from cutting Neck:  Midline trachea, normal thyroid, good ROM, no lymphadenopathy Lungs; Clear to auscultation bilaterally Breast:  No dominant palpable mass, retraction, or nipple discharge Cardiovascular: Regular rate and rhythm Abdomen:  Soft, non tender, no hepatosplenomegaly Pelvic:  External genitalia is normal in appearance, has hidradenitis on inner thighs.  The vagina is normal in appearance. Urethra has no lesions or masses. The cervix is smooth, pap with HPV and GC/CHL obtained.  Uterus is felt to be normal size, shape, and  contour,mildly tender.  No adnexal masses or tenderness noted.Bladder is non tender, no masses felt. Extremities/musculoskeletal:  No swelling or varicosities noted, no clubbing or cyanosis Psych:  No mood changes, alert and cooperative,seems happy PHQ 2 score 0 Fall risk low. Examination chaperoned by Federico FlakePeggy Dones CMA.  Impression: 1. Encounter for gynecological examination with Papanicolaou smear of cervix   2. Routine cervical smear   3. Hepatitis C virus infection without hepatic coma, unspecified chronicity   4. [redacted] weeks gestation of pregnancy   5. Bipolar 1 disorder, mixed, mild (HCC)   6. Encounter to determine fetal viability of pregnancy, single or unspecified fetus   7. Positive pregnancy test   8. Smoker   9. History of heroin use   10. History of cesarean section   11. Hidradenitis       Plan: Continue PNV Return in 1 day for dating US/1 week for new OB Review handouts on First trimester and by [facility]

## 2018-06-26 NOTE — Patient Instructions (Signed)
First Trimester of Pregnancy The first trimester of pregnancy is from week 1 until the end of week 13 (months 1 through 3). A week after a sperm fertilizes an egg, the egg will implant on the wall of the uterus. This embryo will begin to develop into a baby. Genes from you and your partner will form the baby. The female genes will determine whether the baby will be a boy or a girl. At 6-8 weeks, the eyes and face will be formed, and the heartbeat can be seen on ultrasound. At the end of 12 weeks, all the baby's organs will be formed. Now that you are pregnant, you will want to do everything you can to have a healthy baby. Two of the most important things are to get good prenatal care and to follow your health care provider's instructions. Prenatal care is all the medical care you receive before the baby's birth. This care will help prevent, find, and treat any problems during the pregnancy and childbirth. Body changes during your first trimester Your body goes through many changes during pregnancy. The changes vary from woman to woman.  You may gain or lose a couple of pounds at first.  You may feel sick to your stomach (nauseous) and you may throw up (vomit). If the vomiting is uncontrollable, call your health care provider.  You may tire easily.  You may develop headaches that can be relieved by medicines. All medicines should be approved by your health care provider.  You may urinate more often. Painful urination may mean you have a bladder infection.  You may develop heartburn as a result of your pregnancy.  You may develop constipation because certain hormones are causing the muscles that push stool through your intestines to slow down.  You may develop hemorrhoids or swollen veins (varicose veins).  Your breasts may begin to grow larger and become tender. Your nipples may stick out more, and the tissue that surrounds them (areola) may become darker.  Your gums may bleed and may be  sensitive to brushing and flossing.  Dark spots or blotches (chloasma, mask of pregnancy) may develop on your face. This will likely fade after the baby is born.  Your menstrual periods will stop.  You may have a loss of appetite.  You may develop cravings for certain kinds of food.  You may have changes in your emotions from day to day, such as being excited to be pregnant or being concerned that something may go wrong with the pregnancy and baby.  You may have more vivid and strange dreams.  You may have changes in your hair. These can include thickening of your hair, rapid growth, and changes in texture. Some women also have hair loss during or after pregnancy, or hair that feels dry or thin. Your hair will most likely return to normal after your baby is born.  What to expect at prenatal visits During a routine prenatal visit:  You will be weighed to make sure you and the baby are growing normally.  Your blood pressure will be taken.  Your abdomen will be measured to track your baby's growth.  The fetal heartbeat will be listened to between weeks 10 and 14 of your pregnancy.  Test results from any previous visits will be discussed.  Your health care provider may ask you:  How you are feeling.  If you are feeling the baby move.  If you have had any abnormal symptoms, such as leaking fluid, bleeding, severe headaches,   or abdominal cramping.  If you are using any tobacco products, including cigarettes, chewing tobacco, and electronic cigarettes.  If you have any questions.  Other tests that may be performed during your first trimester include:  Blood tests to find your blood type and to check for the presence of any previous infections. The tests will also be used to check for low iron levels (anemia) and protein on red blood cells (Rh antibodies). Depending on your risk factors, or if you previously had diabetes during pregnancy, you may have tests to check for high blood  sugar that affects pregnant women (gestational diabetes).  Urine tests to check for infections, diabetes, or protein in the urine.  An ultrasound to confirm the proper growth and development of the baby.  Fetal screens for spinal cord problems (spina bifida) and Down syndrome.  HIV (human immunodeficiency virus) testing. Routine prenatal testing includes screening for HIV, unless you choose not to have this test.  You may need other tests to make sure you and the baby are doing well.  Follow these instructions at home: Medicines  Follow your health care provider's instructions regarding medicine use. Specific medicines may be either safe or unsafe to take during pregnancy.  Take a prenatal vitamin that contains at least 600 micrograms (mcg) of folic acid.  If you develop constipation, try taking a stool softener if your health care provider approves. Eating and drinking  Eat a balanced diet that includes fresh fruits and vegetables, whole grains, good sources of protein such as meat, eggs, or tofu, and low-fat dairy. Your health care provider will help you determine the amount of weight gain that is right for you.  Avoid raw meat and uncooked cheese. These carry germs that can cause birth defects in the baby.  Eating four or five small meals rather than three large meals a day may help relieve nausea and vomiting. If you start to feel nauseous, eating a few soda crackers can be helpful. Drinking liquids between meals, instead of during meals, also seems to help ease nausea and vomiting.  Limit foods that are high in fat and processed sugars, such as fried and sweet foods.  To prevent constipation: ? Eat foods that are high in fiber, such as fresh fruits and vegetables, whole grains, and beans. ? Drink enough fluid to keep your urine clear or pale yellow. Activity  Exercise only as directed by your health care provider. Most women can continue their usual exercise routine during  pregnancy. Try to exercise for 30 minutes at least 5 days a week. Exercising will help you: ? Control your weight. ? Stay in shape. ? Be prepared for labor and delivery.  Experiencing pain or cramping in the lower abdomen or lower back is a good sign that you should stop exercising. Check with your health care provider before continuing with normal exercises.  Try to avoid standing for long periods of time. Move your legs often if you must stand in one place for a long time.  Avoid heavy lifting.  Wear low-heeled shoes and practice good posture.  You may continue to have sex unless your health care provider tells you not to. Relieving pain and discomfort  Wear a good support bra to relieve breast tenderness.  Take warm sitz baths to soothe any pain or discomfort caused by hemorrhoids. Use hemorrhoid cream if your health care provider approves.  Rest with your legs elevated if you have leg cramps or low back pain.  If you develop   varicose veins in your legs, wear support hose. Elevate your feet for 15 minutes, 3-4 times a day. Limit salt in your diet. Prenatal care  Schedule your prenatal visits by the twelfth week of pregnancy. They are usually scheduled monthly at first, then more often in the last 2 months before delivery.  Write down your questions. Take them to your prenatal visits.  Keep all your prenatal visits as told by your health care provider. This is important. Safety  Wear your seat belt at all times when driving.  Make a list of emergency phone numbers, including numbers for family, friends, the hospital, and police and fire departments. General instructions  Ask your health care provider for a referral to a local prenatal education class. Begin classes no later than the beginning of month 6 of your pregnancy.  Ask for help if you have counseling or nutritional needs during pregnancy. Your health care provider can offer advice or refer you to specialists for help  with various needs.  Do not use hot tubs, steam rooms, or saunas.  Do not douche or use tampons or scented sanitary pads.  Do not cross your legs for long periods of time.  Avoid cat litter boxes and soil used by cats. These carry germs that can cause birth defects in the baby and possibly loss of the fetus by miscarriage or stillbirth.  Avoid all smoking, herbs, alcohol, and medicines not prescribed by your health care provider. Chemicals in these products affect the formation and growth of the baby.  Do not use any products that contain nicotine or tobacco, such as cigarettes and e-cigarettes. If you need help quitting, ask your health care provider. You may receive counseling support and other resources to help you quit.  Schedule a dentist appointment. At home, brush your teeth with a soft toothbrush and be gentle when you floss. Contact a health care provider if:  You have dizziness.  You have mild pelvic cramps, pelvic pressure, or nagging pain in the abdominal area.  You have persistent nausea, vomiting, or diarrhea.  You have a bad smelling vaginal discharge.  You have pain when you urinate.  You notice increased swelling in your face, hands, legs, or ankles.  You are exposed to fifth disease or chickenpox.  You are exposed to German measles (rubella) and have never had it. Get help right away if:  You have a fever.  You are leaking fluid from your vagina.  You have spotting or bleeding from your vagina.  You have severe abdominal cramping or pain.  You have rapid weight gain or loss.  You vomit blood or material that looks like coffee grounds.  You develop a severe headache.  You have shortness of breath.  You have any kind of trauma, such as from a fall or a car accident. Summary  The first trimester of pregnancy is from week 1 until the end of week 13 (months 1 through 3).  Your body goes through many changes during pregnancy. The changes vary from  woman to woman.  You will have routine prenatal visits. During those visits, your health care provider will examine you, discuss any test results you may have, and talk with you about how you are feeling. This information is not intended to replace advice given to you by your health care provider. Make sure you discuss any questions you have with your health care provider. Document Released: 07/04/2001 Document Revised: 06/21/2016 Document Reviewed: 06/21/2016 Elsevier Interactive Patient Education  2018 Elsevier   Inc.  

## 2018-06-27 ENCOUNTER — Other Ambulatory Visit (INDEPENDENT_AMBULATORY_CARE_PROVIDER_SITE_OTHER): Payer: Medicare Other

## 2018-06-27 DIAGNOSIS — O3680X0 Pregnancy with inconclusive fetal viability, not applicable or unspecified: Secondary | ICD-10-CM | POA: Diagnosis not present

## 2018-06-27 NOTE — Progress Notes (Signed)
US 7+4 wks,single IUP positive fht 159 bpm,normal ovaries bilat,crl 13.07 mm

## 2018-07-01 LAB — CYTOLOGY - PAP
Chlamydia: NEGATIVE
Diagnosis: NEGATIVE
HPV: NOT DETECTED
Neisseria Gonorrhea: NEGATIVE

## 2018-07-04 ENCOUNTER — Encounter: Payer: Medicare Other | Admitting: Women's Health

## 2018-07-04 ENCOUNTER — Ambulatory Visit: Payer: Medicare Other | Admitting: *Deleted

## 2018-07-09 ENCOUNTER — Ambulatory Visit (INDEPENDENT_AMBULATORY_CARE_PROVIDER_SITE_OTHER): Payer: Medicare Other | Admitting: Student in an Organized Health Care Education/Training Program

## 2018-07-09 VITALS — BP 105/64 | HR 85 | Temp 98.6°F | Wt 215.0 lb

## 2018-07-09 DIAGNOSIS — B182 Chronic viral hepatitis C: Secondary | ICD-10-CM | POA: Diagnosis not present

## 2018-07-09 DIAGNOSIS — F1120 Opioid dependence, uncomplicated: Secondary | ICD-10-CM | POA: Diagnosis present

## 2018-07-09 DIAGNOSIS — F319 Bipolar disorder, unspecified: Secondary | ICD-10-CM

## 2018-07-09 DIAGNOSIS — F1199 Opioid use, unspecified with unspecified opioid-induced disorder: Secondary | ICD-10-CM

## 2018-07-09 DIAGNOSIS — Z331 Pregnant state, incidental: Secondary | ICD-10-CM | POA: Diagnosis not present

## 2018-07-09 DIAGNOSIS — F1190 Opioid use, unspecified, uncomplicated: Secondary | ICD-10-CM

## 2018-07-09 DIAGNOSIS — F17210 Nicotine dependence, cigarettes, uncomplicated: Secondary | ICD-10-CM | POA: Diagnosis not present

## 2018-07-09 MED ORDER — BUPRENORPHINE HCL-NALOXONE HCL 8.6-2.1 MG SL SUBL: 1.0000 | SUBLINGUAL_TABLET | Freq: Two times a day (BID) | SUBLINGUAL | 0 refills | Status: DC

## 2018-07-09 NOTE — Assessment & Plan Note (Signed)
Chronic hepatitis C genotype 2B.  No signs of liver disease, fibro-sure score of 0 with no evident fibrosis.  We were going through the process of prescribing Mavyret, but we will put that on hold now that she is pregnant.  She would be eligible again for treatment after the pregnancy.  We talked about the very low transmission rate from mother to fetus.

## 2018-07-09 NOTE — Progress Notes (Signed)
   07/09/2018  Sydney BoopSarah A [name] presents for follow up of opioid use disorder I have reviewed the prior induction visit, follow up visits, and telephone encounters relevant to opiate use disorder (OUD) treatment.   Current daily dose: ZUBSOLV 8.6mg  BID  Date of Induction: 03/05/2018  Current follow up interval, in weeks: 4  The patient has been adherent with the buprenorphine for OUD contract.   Last UDS Result: Appropriate  HPI: 27 year old woman, early-stage pregnancy, here for follow-up of opioid use disorder.  Reports doing well, she was surprised to hear that she was pregnant.  Living with her fianc who is supportive.  She reports good compliance with ZUBSOLV.  Denies any side effects.  Reports her cravings are well controlled.  Denies any drug use.  She is been doing research about the effects of buprenorphine on the fetus.  She had an initial appointment with an obstetrician office 2 weeks ago, next week she is going for dating ultrasound and to meet a new obstetrician.  She discontinued mood stabilizers including Lamictal.  Says her mood has been stable.  Denies any depressed mood, mania, hypomania, or anxiety.  Exam:   Vitals:   07/09/18 1127  BP: 105/64  Pulse: 85  Temp: 98.6 F (37 C)  TempSrc: Oral  SpO2: 100%  Weight: 215 lb (97.5 kg)    General: Well-appearing woman, no distress Heart: Regular rate and rhythm with no murmurs Lungs are clear throughout Extremities are warm well perfused with no edema Abdomen is soft and nontender, nondistended  Assessment/Plan:  See Problem Based Charting in the Encounters Tab     Sydney AliasVincent, Sydney Natzke Thomas, MD  07/09/2018  2:44 PM

## 2018-07-09 NOTE — Assessment & Plan Note (Signed)
Severe opioid use disorder is well controlled.  Now complicated by early pregnancy.  We talked about the risks and benefits of continuing with some treatment.  I think the most important thing is to prevent relapse, which the patient and her partner agree with.  Plan from my perspective would be to continue with buprenorphine through this pregnancy.  We will need to coordinate with her obstetrician who she is going to meet next week.  We are [facility].  Plan is to continue with ZUBSOLV 8.6 mg twice daily.  Follow-up in 4 weeks.

## 2018-07-09 NOTE — Assessment & Plan Note (Signed)
Mood is currently stable.  I am worried that she has discontinued Lamictal because of her new pregnancy.  She has a long history of bipolar disease been on many different medication regimens.  She been unable to follow-up with a psychiatrist since returning to [place] over the last few months.  I am going to try to arrange for at least a phone call through behavioral health so we can better recommend a medication regimen that will keep her mood stable as well as be safe for the fetus.

## 2018-07-10 ENCOUNTER — Telehealth: Payer: Self-pay

## 2018-07-10 NOTE — Telephone Encounter (Signed)
[facility] - Left Message  

## 2018-07-11 ENCOUNTER — Telehealth: Payer: Self-pay

## 2018-07-11 DIAGNOSIS — F319 Bipolar disorder, unspecified: Secondary | ICD-10-CM

## 2018-07-11 DIAGNOSIS — F1199 Opioid use, unspecified with unspecified opioid-induced disorder: Secondary | ICD-10-CM

## 2018-07-11 DIAGNOSIS — F4310 Post-traumatic stress disorder, unspecified: Secondary | ICD-10-CM

## 2018-07-11 DIAGNOSIS — F1190 Opioid use, unspecified, uncomplicated: Secondary | ICD-10-CM
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
## 2018-07-15 ENCOUNTER — Telehealth: Payer: Self-pay

## 2018-07-15 NOTE — Telephone Encounter (Signed)
Writer referred the patient to the following facilities that provide DBT therapy.   Patient reports that she understands that [facility] will not provide services.  Patient will be [facility] on the [facility] list.  If further services are needed please contact me from [phone].   Writer routed this information to the PCP and Dr. Vanetta ShawlHisada.    Patient denies SI/HI/Psychosis/Substance Abuse. If your symptoms worsen or you have thoughts of suicide/homicide, PLEASE SEEK IMMEDIATE MEDICAL ATTENTION.  You may always call:  National Suicide Hotline: 225 004 1872(914) 420-7010;  [place] Crisis Line: (587) 112-1263435-361-0576;  Crisis Recovery in [place]: [phone].  These are available 24 hours a day, 7 days a week.     [facility] Address: [address], [address] [place], [address] Phone: [phone] Email: [email or web address] Business Hours: M-F 8a-8p, S-S by appointment   [facility] Address: [address], [place], [place] [address] Hours: 9am - 5pm Open ? Closes 5PM Phone: [phone]   [facility] [place] Address: [address], [place], [place] [address] Hours:  Open ? Closes 6PM Phone: [phone]

## 2018-07-15 NOTE — Progress Notes (Signed)
Discussed with [name]. 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 The patient is also willing to have face to face therapy. [facility] or [facility].   BH specialist to contact the patient and PCP regarding the above recommendation.

## 2018-07-24 NOTE — L&D Delivery Note (Signed)
OB/GYN Faculty Practice Delivery Note  [name] is a 28 y.o. G2P1001 s/p VBAC at [redacted]w[redacted]d. She was admitted for induction of labor for gestational HTN.   ROM: 12h 62m with clear fluid GBS Status: negative Maximum Maternal Temperature: Temp (48hrs), Avg:98.2 F (36.8 C), Min:97.6 F (36.4 C), Max:98.8 F (37.1 C)  Labor Progress: . Induction started with pitocin, followed by FB . Was on pitocin for over 24 hours then given break . Epidural placed . Restarted pitocin, AROM clear fluids . Progressed to complete  Delivery Date/Time: 01/20/19 at 2216 Delivery: Called to room and patient was complete and pushing. Head delivered ROA. Nuchal and body cord present, reduced after delivery. Shoulder and body delivered in usual fashion. Infant with spontaneous cry, placed on mother's abdomen, dried and stimulated. Cord clamped x 2 after 1-minute delay, and cut by father of baby. Cord blood drawn. Placenta delivered spontaneously with gentle cord traction. Fundus firm with massage and Pitocin. Labia, perineum, vagina, and cervix inspected inspected with 1st degree perineal laceration.   Placenta: spontaneous, intact, 3-vessel cord (to be discarded) Complications: PPH - given TXA x2 and rectal cytotec Lacerations: 1st degree perineal repaired with 3-0 Vicryl EBL: 1084 per Triton Analgesia: epidural  Postpartum Planning [x]  message to sent to schedule follow-up  [x]  vaccines UTD  Infant: Vigorous female  APGARs 8, 9  weight pending but appears AGA  [name] S. Juleen China, DO OB/GYN Fellow, Faculty Practice

## 2018-07-30 ENCOUNTER — Other Ambulatory Visit: Payer: Self-pay

## 2018-07-30 ENCOUNTER — Ambulatory Visit: Payer: Medicare Other | Admitting: *Deleted

## 2018-07-30 ENCOUNTER — Other Ambulatory Visit: Payer: Self-pay | Admitting: Women's Health

## 2018-07-30 ENCOUNTER — Encounter: Payer: Self-pay | Admitting: Women's Health

## 2018-07-30 ENCOUNTER — Other Ambulatory Visit (INDEPENDENT_AMBULATORY_CARE_PROVIDER_SITE_OTHER): Payer: Medicare Other

## 2018-07-30 ENCOUNTER — Ambulatory Visit (INDEPENDENT_AMBULATORY_CARE_PROVIDER_SITE_OTHER): Payer: Medicare Other | Admitting: Women's Health

## 2018-07-30 VITALS — BP 115/73 | HR 95 | Wt 214.0 lb

## 2018-07-30 DIAGNOSIS — B182 Chronic viral hepatitis C: Secondary | ICD-10-CM

## 2018-07-30 DIAGNOSIS — F1120 Opioid dependence, uncomplicated: Secondary | ICD-10-CM

## 2018-07-30 DIAGNOSIS — Z975 Presence of (intrauterine) contraceptive device: Secondary | ICD-10-CM

## 2018-07-30 DIAGNOSIS — O09299 Supervision of pregnancy with other poor reproductive or obstetric history, unspecified trimester: Secondary | ICD-10-CM | POA: Diagnosis not present

## 2018-07-30 DIAGNOSIS — Z3A12 12 weeks gestation of pregnancy: Secondary | ICD-10-CM | POA: Diagnosis not present

## 2018-07-30 DIAGNOSIS — Z23 Encounter for immunization: Secondary | ICD-10-CM

## 2018-07-30 DIAGNOSIS — F17200 Nicotine dependence, unspecified, uncomplicated: Secondary | ICD-10-CM

## 2018-07-30 DIAGNOSIS — Z3402 Encounter for supervision of normal first pregnancy, second trimester: Secondary | ICD-10-CM

## 2018-07-30 DIAGNOSIS — O0990 Supervision of high risk pregnancy, unspecified, unspecified trimester: Secondary | ICD-10-CM | POA: Insufficient documentation

## 2018-07-30 DIAGNOSIS — Z98891 History of uterine scar from previous surgery: Secondary | ICD-10-CM

## 2018-07-30 DIAGNOSIS — O99331 Smoking (tobacco) complicating pregnancy, first trimester: Secondary | ICD-10-CM

## 2018-07-30 DIAGNOSIS — Z3682 Encounter for antenatal screening for nuchal translucency: Secondary | ICD-10-CM

## 2018-07-30 DIAGNOSIS — O09291 Supervision of pregnancy with other poor reproductive or obstetric history, first trimester: Secondary | ICD-10-CM

## 2018-07-30 DIAGNOSIS — Z3481 Encounter for supervision of other normal pregnancy, first trimester: Secondary | ICD-10-CM

## 2018-07-30 DIAGNOSIS — F1199 Opioid use, unspecified with unspecified opioid-induced disorder: Secondary | ICD-10-CM

## 2018-07-30 DIAGNOSIS — F1190 Opioid use, unspecified, uncomplicated: Secondary | ICD-10-CM

## 2018-07-30 DIAGNOSIS — Z1389 Encounter for screening for other disorder: Secondary | ICD-10-CM

## 2018-07-30 DIAGNOSIS — Z0489 Encounter for examination and observation for other specified reasons: Secondary | ICD-10-CM | POA: Diagnosis not present

## 2018-07-30 DIAGNOSIS — Z3490 Encounter for supervision of normal pregnancy, unspecified, unspecified trimester: Secondary | ICD-10-CM

## 2018-07-30 DIAGNOSIS — Z331 Pregnant state, incidental: Secondary | ICD-10-CM

## 2018-07-30 DIAGNOSIS — O99321 Drug use complicating pregnancy, first trimester: Secondary | ICD-10-CM

## 2018-07-30 DIAGNOSIS — O99320 Drug use complicating pregnancy, unspecified trimester: Secondary | ICD-10-CM

## 2018-07-30 LAB — POCT URINALYSIS DIPSTICK OB
Blood, UA: NEGATIVE
Glucose, UA: NEGATIVE
Ketones, UA: NEGATIVE
Leukocytes, UA: NEGATIVE
NITRITE UA: NEGATIVE
PROTEIN: NEGATIVE

## 2018-07-30 NOTE — Patient Instructions (Signed)
Begin taking 162mg  (two 81mg  tablets) baby aspirin daily to decrease risk of preeclampsia during pregnancy     [name], I greatly value your feedback.  If you receive a survey following your visit with [place] today, we appreciate you taking the time to fill it out.  Thanks, [name], CNM, WHNP-BC   Nausea & Vomiting  Have saltine crackers or pretzels by your bed and eat a few bites before you raise your head out of bed in the morning  Eat small frequent meals throughout the day instead of large meals  Drink plenty of fluids throughout the day to stay hydrated, just don't drink a lot of fluids with your meals.  This can make your stomach fill up faster making you feel sick  Do not brush your teeth right after you eat  Products with real ginger are good for nausea, like ginger ale and ginger hard candy Make sure it says made with real ginger!  Sucking on sour candy like lemon heads is also good for nausea  If your prenatal vitamins make you nauseated, take them at night so you will sleep through the nausea  Sea Bands  If you feel like you need medicine for the nausea & vomiting please let us know  If you are unable to keep any fluids or food down please let us know   Constipation  Drink plenty of fluid, preferably water, throughout the day  Eat foods high in fiber such as fruits, vegetables, and grains  Exercise, such as walking, is a good way to keep your bowels regular  Drink warm fluids, especially warm prune juice, or decaf coffee  Eat a 1/2 cup of real oatmeal (not instant), 1/2 cup applesauce, and 1/2-1 cup warm prune juice every day  If needed, you may take Colace (docusate sodium) stool softener once or twice a day to help keep the stool soft. If you are pregnant, wait until you are out of your first trimester (12-14 weeks of pregnancy)  If you still are having problems with constipation, you may take Miralax once daily as needed to help keep your bowels regular.   If you are pregnant, wait until you are out of your first trimester (12-14 weeks of pregnancy)   First Trimester of Pregnancy The first trimester of pregnancy is from week 1 until the end of week 12 (months 1 through 3). A week after a sperm fertilizes an egg, the egg will implant on the wall of the uterus. This embryo will begin to develop into a baby. Genes from you and your partner are forming the baby. The female genes determine whether the baby is a boy or a girl. At 6-8 weeks, the eyes and face are formed, and the heartbeat can be seen on ultrasound. At the end of 12 weeks, all the baby's organs are formed.  Now that you are pregnant, you will want to do everything you can to have a healthy baby. Two of the most important things are to get good prenatal care and to follow your health care provider's instructions. Prenatal care is all the medical care you receive before the baby's birth. This care will help prevent, find, and treat any problems during the pregnancy and childbirth. BODY CHANGES Your body goes through many changes during pregnancy. The changes vary from woman to woman.   You may gain or lose a couple of pounds at first.  You may feel sick to your stomach (nauseous) and throw up (vomit).  If the vomiting is uncontrollable, call your health care provider.  You may tire easily.  You may develop headaches that can be relieved by medicines approved by your health care provider.  You may urinate more often. Painful urination may mean you have a bladder infection.  You may develop heartburn as a result of your pregnancy.  You may develop constipation because certain hormones are causing the muscles that push waste through your intestines to slow down.  You may develop hemorrhoids or swollen, bulging veins (varicose veins).  Your breasts may begin to grow larger and become tender. Your nipples may stick out more, and the tissue that surrounds them (areola) may become darker.  Your  gums may bleed and may be sensitive to brushing and flossing.  Dark spots or blotches (chloasma, mask of pregnancy) may develop on your face. This will likely fade after the baby is born.  Your menstrual periods will stop.  You may have a loss of appetite.  You may develop cravings for certain kinds of food.  You may have changes in your emotions from day to day, such as being excited to be pregnant or being concerned that something may go wrong with the pregnancy and baby.  You may have more vivid and strange dreams.  You may have changes in your hair. These can include thickening of your hair, rapid growth, and changes in texture. Some women also have hair loss during or after pregnancy, or hair that feels dry or thin. Your hair will most likely return to normal after your baby is born. WHAT TO EXPECT AT YOUR PRENATAL VISITS During a routine prenatal visit:  You will be weighed to make sure you and the baby are growing normally.  Your blood pressure will be taken.  Your abdomen will be measured to track your baby's growth.  The fetal heartbeat will be listened to starting around week 10 or 12 of your pregnancy.  Test results from any previous visits will be discussed. Your health care provider may ask you:  How you are feeling.  If you are feeling the baby move.  If you have had any abnormal symptoms, such as leaking fluid, bleeding, severe headaches, or abdominal cramping.  If you have any questions. Other tests that may be performed during your first trimester include:  Blood tests to find your blood type and to check for the presence of any previous infections. They will also be used to check for low iron levels (anemia) and Rh antibodies. Later in the pregnancy, blood tests for diabetes will be done along with other tests if problems develop.  Urine tests to check for infections, diabetes, or protein in the urine.  An ultrasound to confirm the proper growth and  development of the baby.  An amniocentesis to check for possible genetic problems.  Fetal screens for spina bifida and Down syndrome.  You may need other tests to make sure you and the baby are doing well. HOME CARE INSTRUCTIONS  Medicines  Follow your health care provider's instructions regarding medicine use. Specific medicines may be either safe or unsafe to take during pregnancy.  Take your prenatal vitamins as directed.  If you develop constipation, try taking a stool softener if your health care provider approves. Diet  Eat regular, well-balanced meals. Choose a variety of foods, such as meat or vegetable-based protein, fish, milk and low-fat dairy products, vegetables, fruits, and whole grain breads and cereals. Your health care provider will help you determine the amount  of weight gain that is right for you.  Avoid raw meat and uncooked cheese. These carry germs that can cause birth defects in the baby.  Eating four or five small meals rather than three large meals a day may help relieve nausea and vomiting. If you start to feel nauseous, eating a few soda crackers can be helpful. Drinking liquids between meals instead of during meals also seems to help nausea and vomiting.  If you develop constipation, eat more high-fiber foods, such as fresh vegetables or fruit and whole grains. Drink enough fluids to keep your urine clear or pale yellow. Activity and Exercise  Exercise only as directed by your health care provider. Exercising will help you:  Control your weight.  Stay in shape.  Be prepared for labor and delivery.  Experiencing pain or cramping in the lower abdomen or low back is a good sign that you should stop exercising. Check with your health care provider before continuing normal exercises.  Try to avoid standing for long periods of time. Move your legs often if you must stand in one place for a long time.  Avoid heavy lifting.  Wear low-heeled shoes, and  practice good posture.  You may continue to have sex unless your health care provider directs you otherwise. Relief of Pain or Discomfort  Wear a good support bra for breast tenderness.   Take warm sitz baths to soothe any pain or discomfort caused by hemorrhoids. Use hemorrhoid cream if your health care provider approves.   Rest with your legs elevated if you have leg cramps or low back pain.  If you develop varicose veins in your legs, wear support hose. Elevate your feet for 15 minutes, 3-4 times a day. Limit salt in your diet. Prenatal Care  Schedule your prenatal visits by the twelfth week of pregnancy. They are usually scheduled monthly at first, then more often in the last 2 months before delivery.  Write down your questions. Take them to your prenatal visits.  Keep all your prenatal visits as directed by your health care provider. Safety  Wear your seat belt at all times when driving.  Make a list of emergency phone numbers, including numbers for family, friends, the hospital, and police and fire departments. General Tips  Ask your health care provider for a referral to a local prenatal education class. Begin classes no later than at the beginning of month 6 of your pregnancy.  Ask for help if you have counseling or nutritional needs during pregnancy. Your health care provider can offer advice or refer you to specialists for help with various needs.  Do not use hot tubs, steam rooms, or saunas.  Do not douche or use tampons or scented sanitary pads.  Do not cross your legs for long periods of time.  Avoid cat litter boxes and soil used by cats. These carry germs that can cause birth defects in the baby and possibly loss of the fetus by miscarriage or stillbirth.  Avoid all smoking, herbs, alcohol, and medicines not prescribed by your health care provider. Chemicals in these affect the formation and growth of the baby.  Schedule a dentist appointment. At home, brush  your teeth with a soft toothbrush and be gentle when you floss. SEEK MEDICAL CARE IF:   You have dizziness.  You have mild pelvic cramps, pelvic pressure, or nagging pain in the abdominal area.  You have persistent nausea, vomiting, or diarrhea.  You have a bad smelling vaginal discharge.  You have  pain with urination.  You notice increased swelling in your face, hands, legs, or ankles. SEEK IMMEDIATE MEDICAL CARE IF:   You have a fever.  You are leaking fluid from your vagina.  You have spotting or bleeding from your vagina.  You have severe abdominal cramping or pain.  You have rapid weight gain or loss.  You vomit blood or material that looks like coffee grounds.  You are exposed to [place] measles and have never had them.  You are exposed to fifth disease or chickenpox.  You develop a severe headache.  You have shortness of breath.  You have any kind of trauma, such as from a fall or a car accident. Document Released: 07/04/2001 Document Revised: 11/24/2013 Document Reviewed: 05/20/2013 [facility] Patient Information 2015 [place], [place]. This information is not intended to replace advice given to you by your health care provider. Make sure you discuss any questions you have with your health care provider.

## 2018-07-30 NOTE — Progress Notes (Signed)
INITIAL OBSTETRICAL VISIT Patient name: [name] MRN [id number]  Date of birth: 1991/04/11 Chief Complaint:   Initial Prenatal Visit (nausea, constipation)  History of Present Illness:   [name] is a 28 y.o. G62P1001 Caucasian female at [redacted]w[redacted]d by 7wk u/s, with an Estimated Date of Delivery: 02/05/19 being seen today for her initial obstetrical visit.   Her obstetrical history is significant for failed IOL @ 41wks, c/s, 9lb10oz baby, reports pre-e w/ this pregnancy.   Today she reports constipation, nausea.  Smoker: 2ppd prior to pregnancy, now 1/2ppd, wants to quit Recently dx w/ Hep C in Aug, genotype 2b, was going to start meds, then found out she was pregnant H/O IV heroin use, has been clean x , on Zubsolv (suboxone) since, was on 8.6mg  BID prior to pregnancy, then weaned herself to 2.9mg  (1/3 of her 8.6mg  tab) TID since Nov and has done well at this dosage. Goes to Internal Med @ [facility] w/ Dr. Oswaldo Done q mth, goes to counseling at [facility] qwk. Reports Implanon still in place, was inserted 59yrs ago after birth of her child, never had it removed b/c she figured it was still working  Bipolar- was on Armed forces logistics/support/administrative officer, ambien, and ativan prior to pregnancy, quit w/ +PT- doing well, states she took wellbutrin last pregnancy and did well w/ it- does not feel she needs to be on meds at this time. Denies SI. States her sx are really only bad when she is using drugs.  Patient's last menstrual period was 04/28/2018. Last pap unsure. Results were: normal Review of Systems:   Pertinent items are noted in HPI Denies cramping/contractions, leakage of fluid, vaginal bleeding, abnormal vaginal discharge w/ itching/odor/irritation, headaches, visual changes, shortness of breath, chest pain, abdominal pain, severe nausea/vomiting, or problems with urination or bowel movements unless otherwise stated above.  Pertinent History Reviewed:  Reviewed past medical,surgical, social, obstetrical and family  history.  Reviewed problem list, medications and allergies. OB History  Gravida Para Term Preterm AB Living  2 1 1    0 1  SAB TAB Ectopic Multiple Live Births          1    # Outcome Date GA Lbr Len/2nd Weight Sex Delivery Anes PTL Lv  2 Current           1 Term 11/30/09 [redacted]w[redacted]d  9 lb 10 oz (4.366 kg) F CS-LTranv EPI N LIV     Complications: Preeclampsia   Physical Assessment:   Vitals:   07/30/18 1413  BP: 115/73  Pulse: 95  Weight: 214 lb (97.1 kg)  Body mass index is 43.22 kg/m.       Physical Examination:  General appearance - well appearing, and in no distress  Mental status - alert, oriented to person, place, and time  Psych:  She has a normal mood and affect  Skin - warm and dry, normal color, no suspicious lesions noted  Chest - effort normal, all lung fields clear to auscultation bilaterally  Heart - normal rate and regular rhythm  Abdomen - soft, nontender  Extremities:  No swelling or varicosities noted  Pelvic - VULVA: normal appearing vulva with no masses, tenderness or lesions  VAGINA: normal appearing vagina with normal color and discharge, no lesions  CERVIX: normal appearing cervix without discharge or lesions, no CMT  Thin prep pap is done w/ reflex HR HPV cotesting  Today's NT [place]: [place] 12+2 wks,measurements c/w dates,crl 61.90 mm,fhr 151 bpm,NB present ,NT 1.3 mm,normal ovaries bilat,posterior  placenta    Assessment & Plan:  1) Low-Risk Pregnancy G2P1001 at [redacted]w[redacted]d with an Estimated Date of Delivery: 02/05/19   2) Initial OB visit  3) HepC+> last quant 207,000 w/ HCV log10 of 5.316 and LFTs 46/39 Aug 2019; discussed w/ LHE, no further quants needed during pregnancy, will check LFTs periodically.   4) Zubsolv (suboxone) therapy during pregnancy> currently on 2.9mg  TID (1/3 of her 8.6mg  tab), Dr. Ivor Messier, counseling at [facility] weekly. Discussed NAS/NICU tour  5) Smoker> 2ppd prior to pregnancy, now smokes 1/2ppd, counseled x 3-64mins, advised  cessation, discussed risks to fetus while pregnant, to infant pp, and to herself. Offered [place]uitline[place], accepted, referral sent.     6) H/O pre-e> baseline labs today, begin ASA 162mg  daily today  7) Prev c/s> for FTP/failed IOL, baby 9lb10oz, interested in TOLAC, gave consent to take home and review  8) H/O PPH  9) Implanon still in place> plan removal at next visit  Meds: No orders of the defined types were placed in this encounter.   Initial labs obtained Continue prenatal vitamins Reviewed n/v relief measures and warning s/s to report Reviewed recommended weight gain based on pre-gravid BMI Encouraged well-balanced diet Genetic Screening discussed Integrated Screen: requested, 1st IT/NT today Cystic fibrosis screening discussed declined Ultrasound discussed; fetal survey: requested CCNC completed>PCM not here Flu shot today  Follow-up: Return in about 3 weeks (around 08/20/2018) for LROB and nexplanon removal, 2nd IT.   Orders Placed This Encounter  Procedures  . GC/Chlamydia Probe Amp  . Flu Vaccine QUAD 36+ mos IM  . Obstetric Panel, Including HIV  . Urinalysis, Routine w reflex microscopic  . Sickle cell screen  . Pain Management Screening Profile (10S)  . Comprehensive metabolic panel  . Protein / creatinine ratio, urine  . Integrated 1  . POC Urinalysis Dipstick OB    Cheral Marker CNM, [facility] 07/30/2018

## 2018-07-30 NOTE — Progress Notes (Signed)
[place] 12+2 wks,measurements c/w dates,crl 61.90 mm,fhr 151 bpm,NB present ,NT 1.3 mm,normal ovaries bilat

## 2018-07-31 LAB — PMP SCREEN PROFILE (10S), URINE
Amphetamine Scrn, Ur: NEGATIVE ng/mL
BARBITURATE SCREEN URINE: NEGATIVE ng/mL
BENZODIAZEPINE SCREEN, URINE: NEGATIVE ng/mL
CANNABINOIDS UR QL SCN: NEGATIVE ng/mL
COCAINE(METAB.)SCREEN, URINE: NEGATIVE ng/mL
Creatinine(Crt), U: 184.3 mg/dL (ref 20.0–300.0)
Methadone Screen, Urine: NEGATIVE ng/mL
OPIATE SCREEN URINE: NEGATIVE ng/mL
OXYCODONE+OXYMORPHONE UR QL SCN: NEGATIVE ng/mL
Ph of Urine: 6.3 (ref 4.5–8.9)
Phencyclidine Qn, Ur: NEGATIVE ng/mL
Propoxyphene Scrn, Ur: NEGATIVE ng/mL

## 2018-07-31 LAB — URINALYSIS, ROUTINE W REFLEX MICROSCOPIC
BILIRUBIN UA: NEGATIVE
GLUCOSE, UA: NEGATIVE
KETONES UA: NEGATIVE
Leukocytes, UA: NEGATIVE
Nitrite, UA: NEGATIVE
Protein, UA: NEGATIVE
RBC, UA: NEGATIVE
Specific Gravity, UA: 1.025 (ref 1.005–1.030)
Urobilinogen, Ur: 1 mg/dL (ref 0.2–1.0)
pH, UA: 6 (ref 5.0–7.5)

## 2018-07-31 LAB — OBSTETRIC PANEL, INCLUDING HIV
Antibody Screen: NEGATIVE
Basophils Absolute: 0 10*3/uL (ref 0.0–0.2)
Basos: 0 %
EOS (ABSOLUTE): 0.1 10*3/uL (ref 0.0–0.4)
Eos: 1 %
HIV Screen 4th Generation wRfx: NONREACTIVE
Hematocrit: 37.2 % (ref 34.0–46.6)
Hemoglobin: 13.1 g/dL (ref 11.1–15.9)
Hepatitis B Surface Ag: NEGATIVE
Immature Grans (Abs): 0 10*3/uL (ref 0.0–0.1)
Immature Granulocytes: 0 %
Lymphocytes Absolute: 3.5 10*3/uL — ABNORMAL HIGH (ref 0.7–3.1)
Lymphs: 28 %
MCH: 28.4 pg (ref 26.6–33.0)
MCHC: 35.2 g/dL (ref 31.5–35.7)
MCV: 81 fL (ref 79–97)
MONOS ABS: 0.8 10*3/uL (ref 0.1–0.9)
Monocytes: 7 %
NEUTROS PCT: 64 %
Neutrophils Absolute: 8 10*3/uL — ABNORMAL HIGH (ref 1.4–7.0)
Platelets: 188 10*3/uL (ref 150–450)
RBC: 4.61 x10E6/uL (ref 3.77–5.28)
RDW: 14.5 % (ref 11.7–15.4)
RPR Ser Ql: NONREACTIVE
Rh Factor: POSITIVE
Rubella Antibodies, IGG: 3.83 index (ref >0.99)
WBC: 12.5 10*3/uL — ABNORMAL HIGH (ref 3.4–10.8)

## 2018-07-31 LAB — COMPREHENSIVE METABOLIC PANEL
ALT: 19 IU/L (ref 0–32)
AST: 33 IU/L (ref 0–40)
Albumin/Globulin Ratio: 1.5 (ref 1.2–2.2)
Albumin: 4.3 g/dL (ref 3.5–5.5)
Alkaline Phosphatase: 59 IU/L (ref 39–117)
BUN/Creatinine Ratio: 11 (ref 9–23)
BUN: 5 mg/dL — ABNORMAL LOW (ref 6–20)
CO2: 20 mmol/L (ref 20–29)
Calcium: 9.5 mg/dL (ref 8.7–10.2)
Chloride: 102 mmol/L (ref 96–106)
Creatinine, Ser: 0.45 mg/dL — ABNORMAL LOW (ref 0.57–1.00)
GFR calc Af Amer: 157 mL/min/{1.73_m2} (ref >59)
GFR calc non Af Amer: 136 mL/min/{1.73_m2} (ref >59)
Globulin, Total: 2.9 g/dL (ref 1.5–4.5)
Glucose: 66 mg/dL (ref 65–99)
Potassium: 4.1 mmol/L (ref 3.5–5.2)
Sodium: 138 mmol/L (ref 134–144)
Total Protein: 7.2 g/dL (ref 6.0–8.5)

## 2018-07-31 LAB — PROTEIN / CREATININE RATIO, URINE
Creatinine, Urine: 163.2 mg/dL
Protein, Ur: 19.5 mg/dL
Protein/Creat Ratio: 119 mg/g creat (ref 0–200)

## 2018-07-31 LAB — SICKLE CELL SCREEN: Sickle Cell Screen: NEGATIVE

## 2018-08-01 LAB — INTEGRATED 1
CROWN RUMP LENGTH MAT SCREEN: 61.9 mm
Gest. Age on Collection Date: 12.4 weeks
Maternal Age at EDD: 29.9 yr
Nuchal Translucency (NT): 1.3 mm
Number of Fetuses: 1
PAPP-A Value: 1364.2 ng/mL
Weight: 214 [lb_av]

## 2018-08-01 LAB — GC/CHLAMYDIA PROBE AMP
Chlamydia trachomatis, NAA: NEGATIVE
Neisseria gonorrhoeae by PCR: NEGATIVE

## 2018-08-06 ENCOUNTER — Other Ambulatory Visit: Payer: Self-pay

## 2018-08-06 ENCOUNTER — Ambulatory Visit (INDEPENDENT_AMBULATORY_CARE_PROVIDER_SITE_OTHER): Payer: Medicare Other | Admitting: Internal Medicine

## 2018-08-06 ENCOUNTER — Encounter: Payer: Self-pay | Admitting: Internal Medicine

## 2018-08-06 DIAGNOSIS — O99321 Drug use complicating pregnancy, first trimester: Secondary | ICD-10-CM

## 2018-08-06 DIAGNOSIS — B182 Chronic viral hepatitis C: Secondary | ICD-10-CM

## 2018-08-06 DIAGNOSIS — F1190 Opioid use, unspecified, uncomplicated: Secondary | ICD-10-CM

## 2018-08-06 DIAGNOSIS — F1120 Opioid dependence, uncomplicated: Secondary | ICD-10-CM | POA: Diagnosis not present

## 2018-08-06 DIAGNOSIS — F1199 Opioid use, unspecified with unspecified opioid-induced disorder: Secondary | ICD-10-CM

## 2018-08-06 DIAGNOSIS — F17210 Nicotine dependence, cigarettes, uncomplicated: Secondary | ICD-10-CM

## 2018-08-06 DIAGNOSIS — F17200 Nicotine dependence, unspecified, uncomplicated: Secondary | ICD-10-CM

## 2018-08-06 DIAGNOSIS — Z3A13 13 weeks gestation of pregnancy: Secondary | ICD-10-CM | POA: Diagnosis not present

## 2018-08-06 MED ORDER — BUPRENORPHINE HCL-NALOXONE HCL 8.6-2.1 MG SL SUBL: 1.0000 | SUBLINGUAL_TABLET | Freq: Every day | SUBLINGUAL | 0 refills | Status: DC

## 2018-08-06 NOTE — Progress Notes (Signed)
   08/09/2018  [name] presents for follow up of opioid use disorder I have reviewed the prior induction visit, follow up visits, and telephone encounters relevant to opiate use disorder (OUD) treatment.   Current daily dose: Zubsolv 8.6mg  BID - she has weaned down to 1 tablet per day, split up into 3 doses  Date of Induction: 03/05/18  Current follow up interval, in weeks: 4  The patient has been adherent with the buprenorphine for OUD contract.   Last UDS Result: Negative for opiate screen on 07/30/18 at her OB.    HPI: [name] is a 28 year old woman with OUD who presents for treatment.  She is about [redacted] weeks pregnant.  She has weaned her buprenorphine dose down to 1 tablet per day.  We discussed the risk of NAS - it is lower with buprenorphine than other treatment options and presumably a lower dose will decrease the risk.  I advised her to remain on treatment, all evidence points to continued treatment being much preferred to relapse onto heroin.  She expressed understanding.  She feels her symptoms are well controlled.  She is doing well focusing on the pregnancy.  Her daughter (8) is very excited about a baby sibling.  We also spent time discussing tobacco use.  She has weaned down from 2 packs per day to about 10 cigarettes per day.  NRT is not indicated in pregnancy, unless the person cannot wean to off by themselves.  Lower dose formulations like the gum or lozenge is preferred.  I discussed these recommendations with her.  She is due to meet with the Neonatology group to get her questions about NAS answered.    Exam:   Vitals:   08/06/18 1139  BP: 109/68  Pulse: 92  Temp: 98.7 F (37.1 C)  TempSrc: Oral  SpO2: 100%  Weight: 211 lb (95.7 kg)    General: Well developed, no acute distress CV: RR, NR, no murmur Pulm: CTAB, no wheezing Abd: Soft, +BS Ext: No edema Psych: Mood is normal, thought process normal.  Assessment/Plan:  See Problem Based Charting in the  Encounters Tab     Sydney Catalina, MD  08/09/2018  11:10 AM

## 2018-08-06 NOTE — Patient Instructions (Signed)
Ms. Burst - -  You are doing very well!  For your buprenorphine, please continue taking one tablet, split up over the day if this is working for you.    For your smoking, please try intermittent dosing of nicotine like the nicotine gum or lozenge.  This appears to be safer in pregnancy than the patches.   Come back in 4 weeks.

## 2018-08-09 NOTE — Assessment & Plan Note (Signed)
She is doing well.  Has weaned down to a total of Zubsolv 8.6 mg per day.  She notes no cravings or relapse.  I advised her to remain on this dose for her pregnancy.  She will be followed closely by OB.   Plan Zubsolv 8.6mg  daily UDS noted at last visit with OB PDMP is appropriate Follow up in 4 weeks.

## 2018-08-09 NOTE — Assessment & Plan Note (Signed)
She will delay treatment until after pregnancy and likely will need to delay until the baby is weaned.  I reassured her about this process.  We will follow up and consider treatment at that time.

## 2018-08-09 NOTE — Assessment & Plan Note (Signed)
We discussed quitting.  I would recommend she continue to try and quit without nicotine replacement.  Discussed the quitline.

## 2018-08-20 ENCOUNTER — Other Ambulatory Visit: Payer: Self-pay

## 2018-08-20 ENCOUNTER — Ambulatory Visit (INDEPENDENT_AMBULATORY_CARE_PROVIDER_SITE_OTHER): Payer: Medicare Other | Admitting: Advanced Practice Midwife

## 2018-08-20 VITALS — BP 106/71 | HR 105 | Wt 211.0 lb

## 2018-08-20 DIAGNOSIS — Z3049 Encounter for surveillance of other contraceptives: Secondary | ICD-10-CM | POA: Diagnosis not present

## 2018-08-20 DIAGNOSIS — Z1379 Encounter for other screening for genetic and chromosomal anomalies: Secondary | ICD-10-CM

## 2018-08-20 DIAGNOSIS — Z1389 Encounter for screening for other disorder: Secondary | ICD-10-CM

## 2018-08-20 DIAGNOSIS — Z3A15 15 weeks gestation of pregnancy: Secondary | ICD-10-CM

## 2018-08-20 DIAGNOSIS — Z3482 Encounter for supervision of other normal pregnancy, second trimester: Secondary | ICD-10-CM

## 2018-08-20 DIAGNOSIS — IMO0002 Reserved for concepts with insufficient information to code with codable children: Secondary | ICD-10-CM

## 2018-08-20 DIAGNOSIS — B182 Chronic viral hepatitis C: Secondary | ICD-10-CM

## 2018-08-20 DIAGNOSIS — Z0489 Encounter for examination and observation for other specified reasons: Secondary | ICD-10-CM

## 2018-08-20 DIAGNOSIS — Z331 Pregnant state, incidental: Secondary | ICD-10-CM

## 2018-08-20 LAB — POCT URINALYSIS DIPSTICK OB
Glucose, UA: NEGATIVE
Ketones, UA: NEGATIVE
Leukocytes, UA: NEGATIVE
Nitrite, UA: NEGATIVE
POC,PROTEIN,UA: NEGATIVE

## 2018-08-20 NOTE — Progress Notes (Signed)
G2P1001 [redacted]w[redacted]d Estimated Date of Delivery: 02/09/19  Blood pressure 106/71, pulse (!) 105, weight 211 lb (95.7 kg), last menstrual period 04/28/2018.   BP weight and urine results all reviewed and noted.  Nexplanon from 9 years ago, still in.   Please refer to the obstetrical flow sheet for the fundal height and fetal heart rate documentation:  Patient denies any bleeding and no rupture of membranes symptoms or regular contractions. Patient is without complaints. All questions were answered.   Physical Assessment:   Vitals:   08/20/18 1440  BP: 106/71  Pulse: (!) 105  Weight: 211 lb (95.7 kg)  Body mass index is 42.62 kg/m.        Physical Examination:   General appearance: Well appearing, and in no distress  Mental status: Alert, oriented to person, place, and time  Skin: Warm & dry  Cardiovascular: Normal heart rate noted  Respiratory: Normal respiratory effort, no distress  Abdomen: Soft, gravid, nontender  Pelvic: Cervical exam deferred         Extremities: Edema: None  Fetal Status: Fetal Heart Rate (bpm): 147        Results for orders placed or performed in visit on 08/20/18 (from the past 24 hour(s))  POC Urinalysis Dipstick OB   Collection Time: 08/20/18  2:39 PM  Result Value Ref Range   Color, UA     Clarity, UA     Glucose, UA Negative Negative   Bilirubin, UA     Ketones, UA neg    Spec Grav, UA     Blood, UA small    pH, UA     POC,PROTEIN,UA Negative Negative, Trace, Small (1+), Moderate (2+), Large (3+), 4+   Urobilinogen, UA     Nitrite, UA neg    Leukocytes, UA Negative Negative   Appearance     Odor       Orders Placed This Encounter  Procedures  . US OB Comp + 14 Wk  . INTEGRATED 2  . POC Urinalysis Dipstick OB    Past Medical History: Past Medical History:  Diagnosis Date  . Bipolar disorder (HCC)   . Hepatitis C    dx 02/2018  2B  . History of heroin use   . Sinus tachycardia     Past Surgical History: Past Surgical  History:  Procedure Laterality Date  . ANTERIOR CRUCIATE LIGAMENT REPAIR    . APPENDECTOMY    . CESAREAN SECTION    . WISDOM TOOTH EXTRACTION      Family History: Family History  Problem Relation Age of Onset  . Cancer Paternal Grandfather   . Heart attack Paternal Grandfather   . Suicidality Paternal Grandmother   . Anxiety disorder Maternal Grandmother   . Hypertension Maternal Grandmother   . Heart attack Maternal Grandfather   . Cancer Father   . Congenital heart disease Father   . Hypertension Mother   . Down syndrome Cousin   . Autism Brother     Social History: Social History   Tobacco Use  . Smoking status: Current Every Day Smoker    Packs/day: 0.25    Types: Cigarettes  . Smokeless tobacco: Never Used  . Tobacco comment: 7-8 cigs per day  Substance Use Topics  . Alcohol use: No  . 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 Haldol [Haloperidol Lactate] Other (See Comments)    Stiff neck     Meds: (Not  in a hospital admission)     Patient given informed consent for removal of her Nexplanon, time out was performed.  Signed copy in the chart.  Appropriate time out taken. Implanon site identified.  Area prepped in usual sterile fashon. One cc of 1% lidocaine was used to anesthetize the area at the distal end of the implant. A small stab incision was made right beside the implant on the distal portion.  The Nexplanon rod was grasped using hemostats and removed without difficulty.  There was less than 3 cc blood loss. There were no complications.  A small amount of antibiotic ointment and steri-strips were applied over the small incision.  A pressure bandage was applied to reduce any bruising.  The patient tolerated the procedure well and was given post procedure instructions.  Plan:  Continued routine obstetrical care,   Return in about 3 weeks (around 09/10/2018) for LROB, NO:[id number].

## 2018-09-03 ENCOUNTER — Ambulatory Visit (INDEPENDENT_AMBULATORY_CARE_PROVIDER_SITE_OTHER): Payer: Medicare Other | Admitting: Internal Medicine

## 2018-09-03 VITALS — BP 121/58 | HR 97 | Wt 214.9 lb

## 2018-09-03 DIAGNOSIS — Z79891 Long term (current) use of opiate analgesic: Secondary | ICD-10-CM

## 2018-09-03 DIAGNOSIS — F1199 Opioid use, unspecified with unspecified opioid-induced disorder: Secondary | ICD-10-CM

## 2018-09-03 DIAGNOSIS — O99320 Drug use complicating pregnancy, unspecified trimester: Secondary | ICD-10-CM

## 2018-09-03 DIAGNOSIS — Z3A00 Weeks of gestation of pregnancy not specified: Secondary | ICD-10-CM

## 2018-09-03 DIAGNOSIS — Z720 Tobacco use: Secondary | ICD-10-CM | POA: Diagnosis not present

## 2018-09-03 DIAGNOSIS — O99330 Smoking (tobacco) complicating pregnancy, unspecified trimester: Secondary | ICD-10-CM

## 2018-09-03 DIAGNOSIS — F1120 Opioid dependence, uncomplicated: Secondary | ICD-10-CM

## 2018-09-03 DIAGNOSIS — F1190 Opioid use, unspecified, uncomplicated: Secondary | ICD-10-CM

## 2018-09-03 MED ORDER — BUPRENORPHINE HCL-NALOXONE HCL 8.6-2.1 MG SL SUBL: 1.0000 | SUBLINGUAL_TABLET | Freq: Every day | SUBLINGUAL | 0 refills | Status: DC

## 2018-09-03 NOTE — Assessment & Plan Note (Signed)
Patient denied current use.  Continues on her Suboxone therapy without missing doses or cravings.  We have discussed transition to buprenorphine only therapy given her pregnancy but she desires to stay on the combined therapy at this time as she is currently stable. She denied other acute issues continues for the OB/GYN for her pregnancy.  Plan: Continue buprenorphine-naloxone 8.6 mg daily Repeat urine tox screen today Return in 4 weeks

## 2018-09-03 NOTE — Progress Notes (Signed)
Internal Medicine Clinic Attending  I saw and evaluated the patient.  I personally confirmed the key portions of the history and exam documented by Dr. Crista Elliot and I reviewed pertinent patient test results.  The assessment, diagnosis, and plan were formulated together and I agree with the documentation in the resident's note.     She is doing very well attending all of her OB appointments.  She reports to me that she has fetal anatomy scan in 1 week.  Has great questions about NAS and I attempted to address them to the best my ability.  As Dr. Criselda Peaches noted current guidelines recommend continuing buprenorphine therapy.  She has not tried to abuse her buprenorphine naloxone prescription.  She is currently on ZUBSOLV 8.6 mg/day.  With cravings mostly well controlled and no use of illicit opioids.  She is interested in further tapering and I encouraged this.  She will try to go down to three fourths of a tablet of ZUBSOLV and continue to titrate down monitoring for cravings.  We will see her back in 4 weeks.

## 2018-09-03 NOTE — Progress Notes (Signed)
   09/03/2018  [name] presents for follow up of opioid use disorder I have reviewed the prior induction visit, follow up visits, and telephone encounters relevant to opiate use disorder (OUD) treatment.   Current daily dose: Zubsolv 8.6mg  daily, patient decreased her 8.6mg  BID dose to once daily  Date of Induction: 03/05/18  Current follow up interval, in weeks: 4  The patient has been adherent with the buprenorphine for OUD contract.   Last UDS Result: 04/23/2018 with unexpected results. Will repeat today  HPI: [name] is a 28 year old female who presents today for continued treatment of her chronic opioid use disorder.  She continues to be compliant with her sublingual Suboxone therapy but decreased her dose to 8.6 mg daily from 8.6 mg twice daily.  She denies increased cravings with this dose change.  Patient endorses good adherence to medication regimen and continues to follow with OB/GYN for her current pregnancy.  She is to determine the sex of the infant at that time.  I have again encouraged her to continue decreasing her tobacco use during pregnancy as this presents a more acute threat to her pregnancy.   Exam:   Vitals:   09/03/18 1048  BP: (!) 121/58  Pulse: 97  SpO2: 98%  Weight: 214 lb 14.4 oz (97.5 kg)    General: A/O x4, in no acute distress, afebrile, nondiaphoretic Cardio: RRR, no mrg's Pulmonary: CTA bilaterally MSK: BLE nontender, nonedematous Neuro: Alert, conversational, normal gait Psych: Appropriate affect, not depressed in appearance, engages well  Assessment/Plan:  See Problem Based Charting in the Encounters Tab     Lanelle Bal, MD  09/03/2018  11:30 AM

## 2018-09-06 ENCOUNTER — Telehealth: Payer: Self-pay | Admitting: *Deleted

## 2018-09-06 NOTE — Telephone Encounter (Signed)
Pt called and states she is out of zubsolv, wants to fill today instead of tomorrow, ask dr Mikey Bussing he agrees, called [facility] ask pharmacist to fill now, he was agreeable, pt informed

## 2018-09-06 NOTE — Telephone Encounter (Signed)
Agree as noted

## 2018-09-08 LAB — TOXASSURE SELECT,+ANTIDEPR,UR

## 2018-09-09 ENCOUNTER — Ambulatory Visit (INDEPENDENT_AMBULATORY_CARE_PROVIDER_SITE_OTHER): Payer: Medicare Other

## 2018-09-09 ENCOUNTER — Encounter: Payer: Self-pay | Admitting: Women's Health

## 2018-09-09 ENCOUNTER — Ambulatory Visit (INDEPENDENT_AMBULATORY_CARE_PROVIDER_SITE_OTHER): Payer: Medicare Other | Admitting: Women's Health

## 2018-09-09 ENCOUNTER — Other Ambulatory Visit: Payer: Self-pay | Admitting: Advanced Practice Midwife

## 2018-09-09 VITALS — BP 112/80 | HR 84 | Wt 212.0 lb

## 2018-09-09 DIAGNOSIS — Z3482 Encounter for supervision of other normal pregnancy, second trimester: Secondary | ICD-10-CM

## 2018-09-09 DIAGNOSIS — O09299 Supervision of pregnancy with other poor reproductive or obstetric history, unspecified trimester: Secondary | ICD-10-CM

## 2018-09-09 DIAGNOSIS — Z363 Encounter for antenatal screening for malformations: Secondary | ICD-10-CM | POA: Diagnosis not present

## 2018-09-09 DIAGNOSIS — Z3A18 18 weeks gestation of pregnancy: Secondary | ICD-10-CM

## 2018-09-09 DIAGNOSIS — Z331 Pregnant state, incidental: Secondary | ICD-10-CM

## 2018-09-09 DIAGNOSIS — Z362 Encounter for other antenatal screening follow-up: Secondary | ICD-10-CM

## 2018-09-09 DIAGNOSIS — Z3402 Encounter for supervision of normal first pregnancy, second trimester: Secondary | ICD-10-CM

## 2018-09-09 DIAGNOSIS — O99320 Drug use complicating pregnancy, unspecified trimester: Secondary | ICD-10-CM

## 2018-09-09 DIAGNOSIS — O4440 Low lying placenta NOS or without hemorrhage, unspecified trimester: Secondary | ICD-10-CM | POA: Insufficient documentation

## 2018-09-09 DIAGNOSIS — Z0489 Encounter for examination and observation for other specified reasons: Secondary | ICD-10-CM

## 2018-09-09 DIAGNOSIS — Z98891 History of uterine scar from previous surgery: Secondary | ICD-10-CM

## 2018-09-09 DIAGNOSIS — F1120 Opioid dependence, uncomplicated: Secondary | ICD-10-CM

## 2018-09-09 DIAGNOSIS — Z1389 Encounter for screening for other disorder: Secondary | ICD-10-CM

## 2018-09-09 DIAGNOSIS — IMO0002 Reserved for concepts with insufficient information to code with codable children: Secondary | ICD-10-CM

## 2018-09-09 LAB — POCT URINALYSIS DIPSTICK OB
Blood, UA: NEGATIVE
Glucose, UA: NEGATIVE
Ketones, UA: NEGATIVE
Leukocytes, UA: NEGATIVE
Nitrite, UA: NEGATIVE
POC,PROTEIN,UA: NEGATIVE

## 2018-09-09 NOTE — Progress Notes (Signed)
[place] 18+1 cephalic,svp 4 cm,normal ovaries bilat,fhr 150 bpm,low lying posterior placenta gr 0,tip of placenta to cx 1.4 cm,cx 4.3 cm,efw 227 g 46%,limited view of heart,please have pt come back for additional images

## 2018-09-09 NOTE — Progress Notes (Signed)
LOW-RISK PREGNANCY VISIT Patient name: [name] MRN [id number]  Date of birth: 07/15/1991 Chief Complaint:   Routine Prenatal Visit ([place] today)  History of Present Illness:   [name] is a 28 y.o. G77P1001 female at [redacted]w[redacted]d with an Estimated Date of Delivery: 02/09/19 being seen today for ongoing management of a low-risk pregnancy.  Today she reports wants VBAC. Doing well on zubsolv 2.9mg  TID. Some sob w/ exertion, still smoking. . Didn't have 2nd IT last visit, was told [facility] wouldn't cover, and not sure if [facility] would pick it up.  Contractions: Not present. Vag. Bleeding: None.  Movement: Absent. denies leaking of fluid. Review of Systems:   Pertinent items are noted in HPI Denies abnormal vaginal discharge w/ itching/odor/irritation, headaches, visual changes, shortness of breath, chest pain, abdominal pain, severe nausea/vomiting, or problems with urination or bowel movements unless otherwise stated above. Pertinent History Reviewed:  Reviewed past medical,surgical, social, obstetrical and family history.  Reviewed problem list, medications and allergies. Physical Assessment:   Vitals:   09/09/18 1000  BP: 112/80  Pulse: 84  Weight: 212 lb (96.2 kg)  Body mass index is 42.82 kg/m.  O2 sat 99% RA        Physical Examination:   General appearance: Well appearing, and in no distress  Mental status: Alert, oriented to person, place, and time  Skin: Warm & dry  Cardiovascular: Normal heart rate noted, HRRR  Respiratory: Normal respiratory effort, no distress, LCTAB  Abdomen: Soft, gravid, nontender  Pelvic: Cervical exam deferred         Extremities: Edema: None  Fetal Status: Fetal Heart Rate (bpm): 150 u/s   Movement: Absent    [place] 18+1 cephalic,svp 4 cm,normal ovaries bilat,fhr 150 bpm,low lying posterior placenta gr 0,tip of placenta to cx 1.4 cm,cx 4.3 cm,efw 227 g 46%,limited view of heart because of pt body habitus,please have pt come back for additional images      Results for orders placed or performed in visit on 09/09/18 (from the past 24 hour(s))  POC Urinalysis Dipstick OB   Collection Time: 09/09/18 10:02 AM  Result Value Ref Range   Color, UA     Clarity, UA     Glucose, UA Negative Negative   Bilirubin, UA     Ketones, UA neg    Spec Grav, UA     Blood, UA neg    pH, UA     POC,PROTEIN,UA Negative Negative, Trace, Small (1+), Moderate (2+), Large (3+), 4+   Urobilinogen, UA     Nitrite, UA neg    Leukocytes, UA Negative Negative   Appearance     Odor      Assessment & Plan:  1) Low-risk pregnancy G2P1001 at [redacted]w[redacted]d with an Estimated Date of Delivery: 02/09/19   2) HepC, plan repeat LFTs w/ PN2, HCV titer 3rd trimester  3) Zubsolv> 2.9mg  TID, doing well, gave NAS/NICU tour info  4) Smoker> advised cessation  5) SOB w/ exertion> normal sats, normal hr>low suspicion PE, lungs clear. Advised smoking cessation  6) H/O Pre-e> baby ASA  7) Prev c/s> wants VBAC, reviewed risks/benefits, consent signed today  8) Low lying placenta> pelvic rest   Meds: No orders of the defined types were placed in this encounter.  Labs/procedures today: anatomy u/s, to check w/ [facility] up front about 2nd IT coverage- pt states if won't cover, she is ok not having  Plan:  Continue routine obstetrical care   Reviewed: Preterm labor symptoms and  general obstetric precautions including but not limited to vaginal bleeding, contractions, leaking of fluid and fetal movement were reviewed in detail with the patient.  All questions were answered  Follow-up: Return in about 2 weeks (around 09/23/2018) for LROB, US:OB F/U heart.  Orders Placed This Encounter  Procedures  . US OB Follow Up  . POC Urinalysis Dipstick OB   Cheral Marker CNM, [facility] 09/09/2018 10:54 AM

## 2018-09-09 NOTE — Patient Instructions (Addendum)
[name], I greatly value your feedback.  If you receive a survey following your visit with [place] today, we appreciate you taking the time to fill it out.  Thanks, [name], CNM, [facility]  [facility]!!! to [facility] ([address] [place], [place] [address]) on Sunday September 15, 2018 at 5:00am It will be called the [facility], and it is located off of [address]. DO NOT GO TO [address] (the current [facility]) on February 23rd or after, no one will be there!   NICU tour/consult with neonatal nurse-practitioner  [phone]     Second Trimester of Pregnancy The second trimester is from week 14 through week 27 (months 4 through 6). The second trimester is often a time when you feel your best. Your body has adjusted to being pregnant, and you begin to feel better physically. Usually, morning sickness has lessened or quit completely, you may have more energy, and you may have an increase in appetite. The second trimester is also a time when the fetus is growing rapidly. At the end of the sixth month, the fetus is about 9 inches long and weighs about 1 pounds. You will likely begin to feel the baby move (quickening) between 16 and 20 weeks of pregnancy. Body changes during your second trimester Your body continues to go through many changes during your second trimester. The changes vary from woman to woman.  Your weight will continue to increase. You will notice your lower abdomen bulging out.  You may begin to get stretch marks on your hips, abdomen, and breasts.  You may develop headaches that can be relieved by medicines. The medicines should be approved by your health care provider.  You may urinate more often because the fetus is pressing on your bladder.  You may develop or continue to have heartburn as a result of your pregnancy.  You may develop constipation because certain hormones are causing the muscles that push  waste through your intestines to slow down.  You may develop hemorrhoids or swollen, bulging veins (varicose veins).  You may have back pain. This is caused by: ? Weight gain. ? Pregnancy hormones that are relaxing the joints in your pelvis. ? A shift in weight and the muscles that support your balance.  Your breasts will continue to grow and they will continue to become tender.  Your gums may bleed and may be sensitive to brushing and flossing.  Dark spots or blotches (chloasma, mask of pregnancy) may develop on your face. This will likely fade after the baby is born.  A dark line from your belly button to the pubic area (linea nigra) may appear. This will likely fade after the baby is born.  You may have changes in your hair. These can include thickening of your hair, rapid growth, and changes in texture. Some women also have hair loss during or after pregnancy, or hair that feels dry or thin. Your hair will most likely return to normal after your baby is born.  What to expect at prenatal visits During a routine prenatal visit:  You will be weighed to make sure you and the fetus are growing normally.  Your blood pressure will be taken.  Your abdomen will be measured to track your baby's growth.  The fetal heartbeat will be listened to.  Any test results from the previous visit will be discussed.  Your health care provider may ask you:  How you  are feeling.  If you are feeling the baby move.  If you have had any abnormal symptoms, such as leaking fluid, bleeding, severe headaches, or abdominal cramping.  If you are using any tobacco products, including cigarettes, chewing tobacco, and electronic cigarettes.  If you have any questions.  Other tests that may be performed during your second trimester include:  Blood tests that check for: ? Low iron levels (anemia). ? High blood sugar that affects pregnant women (gestational diabetes) between 20 and 28 weeks. ? Rh  antibodies. This is to check for a protein on red blood cells (Rh factor).  Urine tests to check for infections, diabetes, or protein in the urine.  An ultrasound to confirm the proper growth and development of the baby.  An amniocentesis to check for possible genetic problems.  Fetal screens for spina bifida and Down syndrome.  HIV (human immunodeficiency virus) testing. Routine prenatal testing includes screening for HIV, unless you choose not to have this test.  Follow these instructions at home: Medicines  Follow your health care provider's instructions regarding medicine use. Specific medicines may be either safe or unsafe to take during pregnancy.  Take a prenatal vitamin that contains at least 600 micrograms (mcg) of folic acid.  If you develop constipation, try taking a stool softener if your health care provider approves. Eating and drinking  Eat a balanced diet that includes fresh fruits and vegetables, whole grains, good sources of protein such as meat, eggs, or tofu, and low-fat dairy. Your health care provider will help you determine the amount of weight gain that is right for you.  Avoid raw meat and uncooked cheese. These carry germs that can cause birth defects in the baby.  If you have low calcium intake from food, talk to your health care provider about whether you should take a daily calcium supplement.  Limit foods that are high in fat and processed sugars, such as fried and sweet foods.  To prevent constipation: ? Drink enough fluid to keep your urine clear or pale yellow. ? Eat foods that are high in fiber, such as fresh fruits and vegetables, whole grains, and beans. Activity  Exercise only as directed by your health care provider. Most women can continue their usual exercise routine during pregnancy. Try to exercise for 30 minutes at least 5 days a week. Stop exercising if you experience uterine contractions.  Avoid heavy lifting, wear low heel shoes, and  practice good posture.  A sexual relationship may be continued unless your health care provider directs you otherwise. Relieving pain and discomfort  Wear a good support bra to prevent discomfort from breast tenderness.  Take warm sitz baths to soothe any pain or discomfort caused by hemorrhoids. Use hemorrhoid cream if your health care provider approves.  Rest with your legs elevated if you have leg cramps or low back pain.  If you develop varicose veins, wear support hose. Elevate your feet for 15 minutes, 3-4 times a day. Limit salt in your diet. Prenatal Care  Write down your questions. Take them to your prenatal visits.  Keep all your prenatal visits as told by your health care provider. This is important. Safety  Wear your seat belt at all times when driving.  Make a list of emergency phone numbers, including numbers for family, friends, the hospital, and police and fire departments. General instructions  Ask your health care provider for a referral to a local prenatal education class. Begin classes no later than the beginning of  month 6 of your pregnancy.  Ask for help if you have counseling or nutritional needs during pregnancy. Your health care provider can offer advice or refer you to specialists for help with various needs.  Do not use hot tubs, steam rooms, or saunas.  Do not douche or use tampons or scented sanitary pads.  Do not cross your legs for long periods of time.  Avoid cat litter boxes and soil used by cats. These carry germs that can cause birth defects in the baby and possibly loss of the fetus by miscarriage or stillbirth.  Avoid all smoking, herbs, alcohol, and unprescribed drugs. Chemicals in these products can affect the formation and growth of the baby.  Do not use any products that contain nicotine or tobacco, such as cigarettes and e-cigarettes. If you need help quitting, ask your health care provider.  Visit your dentist if you have not gone yet  during your pregnancy. Use a soft toothbrush to brush your teeth and be gentle when you floss. Contact a health care provider if:  You have dizziness.  You have mild pelvic cramps, pelvic pressure, or nagging pain in the abdominal area.  You have persistent nausea, vomiting, or diarrhea.  You have a bad smelling vaginal discharge.  You have pain when you urinate. Get help right away if:  You have a fever.  You are leaking fluid from your vagina.  You have spotting or bleeding from your vagina.  You have severe abdominal cramping or pain.  You have rapid weight gain or weight loss.  You have shortness of breath with chest pain.  You notice sudden or extreme swelling of your face, hands, ankles, feet, or legs.  You have not felt your baby move in over an hour.  You have severe headaches that do not go away when you take medicine.  You have vision changes. Summary  The second trimester is from week 14 through week 27 (months 4 through 6). It is also a time when the fetus is growing rapidly.  Your body goes through many changes during pregnancy. The changes vary from woman to woman.  Avoid all smoking, herbs, alcohol, and unprescribed drugs. These chemicals affect the formation and growth your baby.  Do not use any tobacco products, such as cigarettes, chewing tobacco, and e-cigarettes. If you need help quitting, ask your health care provider.  Contact your health care provider if you have any questions. Keep all prenatal visits as told by your health care provider. This is important. This information is not intended to replace advice given to you by your health care provider. Make sure you discuss any questions you have with your health care provider. Document Released: 07/04/2001 Document Revised: 12/16/2015 Document Reviewed: 09/10/2012 Elsevier Interactive Patient Education  2017 [employer]

## 2018-09-25 ENCOUNTER — Encounter: Payer: Self-pay | Admitting: Advanced Practice Midwife

## 2018-09-25 ENCOUNTER — Ambulatory Visit (INDEPENDENT_AMBULATORY_CARE_PROVIDER_SITE_OTHER): Payer: Medicare Other

## 2018-09-25 ENCOUNTER — Ambulatory Visit (INDEPENDENT_AMBULATORY_CARE_PROVIDER_SITE_OTHER): Payer: Medicare Other | Admitting: Advanced Practice Midwife

## 2018-09-25 VITALS — BP 116/83 | HR 75 | Wt 214.0 lb

## 2018-09-25 DIAGNOSIS — Z331 Pregnant state, incidental: Secondary | ICD-10-CM

## 2018-09-25 DIAGNOSIS — Z3482 Encounter for supervision of other normal pregnancy, second trimester: Secondary | ICD-10-CM

## 2018-09-25 DIAGNOSIS — Z3A20 20 weeks gestation of pregnancy: Secondary | ICD-10-CM

## 2018-09-25 DIAGNOSIS — O99320 Drug use complicating pregnancy, unspecified trimester: Secondary | ICD-10-CM

## 2018-09-25 DIAGNOSIS — Z362 Encounter for other antenatal screening follow-up: Secondary | ICD-10-CM

## 2018-09-25 DIAGNOSIS — Z1389 Encounter for screening for other disorder: Secondary | ICD-10-CM

## 2018-09-25 DIAGNOSIS — F1120 Opioid dependence, uncomplicated: Secondary | ICD-10-CM

## 2018-09-25 LAB — POCT URINALYSIS DIPSTICK OB
Blood, UA: NEGATIVE
Glucose, UA: NEGATIVE
Leukocytes, UA: NEGATIVE
Nitrite, UA: NEGATIVE

## 2018-09-25 NOTE — Progress Notes (Signed)
  G2P1001 [redacted]w[redacted]d Estimated Date of Delivery: 02/09/19  Blood pressure 116/83, pulse 75, weight 214 lb (97.1 kg), last menstrual period 04/28/2018.   BP weight and urine results all reviewed and noted.  Please refer to the obstetrical flow sheet for the fundal height and fetal heart rate documentation:  Patient reports good fetal movement, denies any bleeding and no rupture of membranes symptoms or regular contractions. Patient has toothache, using oragel and took ibuprofen. Cautioned about ibuprofen >30 weeks. Has a dentist, he just didn't want to do anything early in pg  To get list of OK meds to give to dentist.   Appetite is "really good".   All questions were answered.   Physical Assessment:   Vitals:   09/25/18 1032  BP: 116/83  Pulse: 75  Weight: 214 lb (97.1 kg)  Body mass index is 43.22 kg/m.        Physical Examination:   General appearance: Well appearing, and in no distress  Mental status: Alert, oriented to person, place, and time  Skin: Warm & dry  Cardiovascular: Normal heart rate noted  Respiratory: Normal respiratory effort, no distress  Abdomen: Soft, gravid, nontender  Pelvic: Cervical exam deferred         Extremities: Edema: None  Fetal Status: Fetal Heart Rate (bpm): 146us   Movement: Present   [place] 20+3 wks,breech,fhr 146 bpm,cx 4.cm,tip of placenta to cx is 2.5 cm,posterior placenta gr 0,svp of fluid 4.6 cm,EFW 378 g 65%,anatomy of the heart complete,no obvious abnormalities   Results for orders placed or performed in visit on 09/25/18 (from the past 24 hour(s))  POC Urinalysis Dipstick OB   Collection Time: 09/25/18 10:40 AM  Result Value Ref Range   Color, UA     Clarity, UA     Glucose, UA Negative Negative   Bilirubin, UA     Ketones, UA small    Spec Grav, UA     Blood, UA neg    pH, UA     POC,PROTEIN,UA Small (1+) Negative, Trace, Small (1+), Moderate (2+), Large (3+), 4+   Urobilinogen, UA     Nitrite, UA neg    Leukocytes, UA Negative  Negative   Appearance     Odor       Orders Placed This Encounter  Procedures  . POC Urinalysis Dipstick OB    Plan:  Continued routine obstetrical care, +  Return in about 4 weeks (around 10/23/2018) for LROB.

## 2018-09-25 NOTE — Progress Notes (Signed)
[place] 20+3 wks,breech,fhr 146 bpm,cx 4.cm,tip of placenta to cx is 2.5 cm,posterior placenta gr 0,svp of fluid 4.6 cm,EFW 378 g 65%,anatomy of the heart complete,no obvious abnormalities

## 2018-09-25 NOTE — Patient Instructions (Signed)
[name], I greatly value your feedback.  If you receive a survey following your visit with [place] today, we appreciate you taking the time to fill it out.  Thanks, [name], CNM     Second Trimester of Pregnancy The second trimester is from week 14 through week 27 (months 4 through 6). The second trimester is often a time when you feel your best. Your body has adjusted to being pregnant, and you begin to feel better physically. Usually, morning sickness has lessened or quit completely, you may have more energy, and you may have an increase in appetite. The second trimester is also a time when the fetus is growing rapidly. At the end of the sixth month, the fetus is about 9 inches long and weighs about 1 pounds. You will likely begin to feel the baby move (quickening) between 16 and 20 weeks of pregnancy. Body changes during your second trimester Your body continues to go through many changes during your second trimester. The changes vary from woman to woman.  Your weight will continue to increase. You will notice your lower abdomen bulging out.  You may begin to get stretch marks on your hips, abdomen, and breasts.  You may develop headaches that can be relieved by medicines. The medicines should be approved by your health care provider.  You may urinate more often because the fetus is pressing on your bladder.  You may develop or continue to have heartburn as a result of your pregnancy.  You may develop constipation because certain hormones are causing the muscles that push waste through your intestines to slow down.  You may develop hemorrhoids or swollen, bulging veins (varicose veins).  You may have back pain. This is caused by: ? Weight gain. ? Pregnancy hormones that are relaxing the joints in your pelvis. ? A shift in weight and the muscles that support your balance.  Your breasts will continue to grow and they will continue to become tender.  Your gums may  bleed and may be sensitive to brushing and flossing.  Dark spots or blotches (chloasma, mask of pregnancy) may develop on your face. This will likely fade after the baby is born.  A dark line from your belly button to the pubic area (linea nigra) may appear. This will likely fade after the baby is born.  You may have changes in your hair. These can include thickening of your hair, rapid growth, and changes in texture. Some women also have hair loss during or after pregnancy, or hair that feels dry or thin. Your hair will most likely return to normal after your baby is born.  What to expect at prenatal visits During a routine prenatal visit:  You will be weighed to make sure you and the fetus are growing normally.  Your blood pressure will be taken.  Your abdomen will be measured to track your baby's growth.  The fetal heartbeat will be listened to.  Any test results from the previous visit will be discussed.  Your health care provider may ask you:  How you are feeling.  If you are feeling the baby move.  If you have had any abnormal symptoms, such as leaking fluid, bleeding, severe headaches, or abdominal cramping.  If you are using any tobacco products, including cigarettes, chewing tobacco, and electronic cigarettes.  If you have any questions.  Other tests that may be performed during your second trimester include:  Blood tests that check for: ? Low iron levels (anemia). ?  High blood sugar that affects pregnant women (gestational diabetes) between 20 and 28 weeks. ? Rh antibodies. This is to check for a protein on red blood cells (Rh factor).  Urine tests to check for infections, diabetes, or protein in the urine.  An ultrasound to confirm the proper growth and development of the baby.  An amniocentesis to check for possible genetic problems.  Fetal screens for spina bifida and Down syndrome.  HIV (human immunodeficiency virus) testing. Routine prenatal testing  includes screening for HIV, unless you choose not to have this test.  Follow these instructions at home: Medicines  Follow your health care provider's instructions regarding medicine use. Specific medicines may be either safe or unsafe to take during pregnancy.  Take a prenatal vitamin that contains at least 600 micrograms (mcg) of folic acid.  If you develop constipation, try taking a stool softener if your health care provider approves. Eating and drinking  Eat a balanced diet that includes fresh fruits and vegetables, whole grains, good sources of protein such as meat, eggs, or tofu, and low-fat dairy. Your health care provider will help you determine the amount of weight gain that is right for you.  Avoid raw meat and uncooked cheese. These carry germs that can cause birth defects in the baby.  If you have low calcium intake from food, talk to your health care provider about whether you should take a daily calcium supplement.  Limit foods that are high in fat and processed sugars, such as fried and sweet foods.  To prevent constipation: ? Drink enough fluid to keep your urine clear or pale yellow. ? Eat foods that are high in fiber, such as fresh fruits and vegetables, whole grains, and beans. Activity  Exercise only as directed by your health care provider. Most women can continue their usual exercise routine during pregnancy. Try to exercise for 30 minutes at least 5 days a week. Stop exercising if you experience uterine contractions.  Avoid heavy lifting, wear low heel shoes, and practice good posture.  A sexual relationship may be continued unless your health care provider directs you otherwise. Relieving pain and discomfort  Wear a good support bra to prevent discomfort from breast tenderness.  Take warm sitz baths to soothe any pain or discomfort caused by hemorrhoids. Use hemorrhoid cream if your health care provider approves.  Rest with your legs elevated if you have  leg cramps or low back pain.  If you develop varicose veins, wear support hose. Elevate your feet for 15 minutes, 3-4 times a day. Limit salt in your diet. Prenatal Care  Write down your questions. Take them to your prenatal visits.  Keep all your prenatal visits as told by your health care provider. This is important. Safety  Wear your seat belt at all times when driving.  Make a list of emergency phone numbers, including numbers for family, friends, the hospital, and police and fire departments. General instructions  Ask your health care provider for a referral to a local prenatal education class. Begin classes no later than the beginning of month 6 of your pregnancy.  Ask for help if you have counseling or nutritional needs during pregnancy. Your health care provider can offer advice or refer you to specialists for help with various needs.  Do not use hot tubs, steam rooms, or saunas.  Do not douche or use tampons or scented sanitary pads.  Do not cross your legs for long periods of time.  Avoid cat litter boxes and  soil used by cats. These carry germs that can cause birth defects in the baby and possibly loss of the fetus by miscarriage or stillbirth.  Avoid all smoking, herbs, alcohol, and unprescribed drugs. Chemicals in these products can affect the formation and growth of the baby.  Do not use any products that contain nicotine or tobacco, such as cigarettes and e-cigarettes. If you need help quitting, ask your health care provider.  Visit your dentist if you have not gone yet during your pregnancy. Use a soft toothbrush to brush your teeth and be gentle when you floss. Contact a health care provider if:  You have dizziness.  You have mild pelvic cramps, pelvic pressure, or nagging pain in the abdominal area.  You have persistent nausea, vomiting, or diarrhea.  You have a bad smelling vaginal discharge.  You have pain when you urinate. Get help right away if:  You  have a fever.  You are leaking fluid from your vagina.  You have spotting or bleeding from your vagina.  You have severe abdominal cramping or pain.  You have rapid weight gain or weight loss.  You have shortness of breath with chest pain.  You notice sudden or extreme swelling of your face, hands, ankles, feet, or legs.  You have not felt your baby move in over an hour.  You have severe headaches that do not go away when you take medicine.  You have vision changes. Summary  The second trimester is from week 14 through week 27 (months 4 through 6). It is also a time when the fetus is growing rapidly.  Your body goes through many changes during pregnancy. The changes vary from woman to woman.  Avoid all smoking, herbs, alcohol, and unprescribed drugs. These chemicals affect the formation and growth your baby.  Do not use any tobacco products, such as cigarettes, chewing tobacco, and e-cigarettes. If you need help quitting, ask your health care provider.  Contact your health care provider if you have any questions. Keep all prenatal visits as told by your health care provider. This is important. This information is not intended to replace advice given to you by your health care provider. Make sure you discuss any questions you have with your health care provider.      CHILDBIRTH CLASSES [phone] is the phone number for Pregnancy Classes or hospital tours at [place] will be referred to  [email or web address] for more information on childbirth classes  At this site you may register for classes. You may sign up for a waiting list if classes are full. Please SIGN UP FOR THIS!.   When the waiting list becomes long, sometimes new classes can be added.

## 2018-09-26 ENCOUNTER — Encounter (HOSPITAL_COMMUNITY): Payer: Self-pay

## 2018-09-26 NOTE — Progress Notes (Signed)
NICU NAS tour scheduled for October 01, 2018 at 1:00.

## 2018-10-01 ENCOUNTER — Ambulatory Visit (INDEPENDENT_AMBULATORY_CARE_PROVIDER_SITE_OTHER): Payer: Medicare Other | Admitting: Internal Medicine

## 2018-10-01 VITALS — BP 117/67 | HR 104 | Temp 98.6°F | Wt 216.9 lb

## 2018-10-01 DIAGNOSIS — F17210 Nicotine dependence, cigarettes, uncomplicated: Secondary | ICD-10-CM

## 2018-10-01 DIAGNOSIS — O99320 Drug use complicating pregnancy, unspecified trimester: Secondary | ICD-10-CM | POA: Diagnosis present

## 2018-10-01 DIAGNOSIS — Z3A00 Weeks of gestation of pregnancy not specified: Secondary | ICD-10-CM

## 2018-10-01 DIAGNOSIS — F1190 Opioid use, unspecified, uncomplicated: Secondary | ICD-10-CM

## 2018-10-01 DIAGNOSIS — F1120 Opioid dependence, uncomplicated: Secondary | ICD-10-CM

## 2018-10-01 DIAGNOSIS — F1199 Opioid use, unspecified with unspecified opioid-induced disorder: Secondary | ICD-10-CM

## 2018-10-01 MED ORDER — BUPRENORPHINE HCL-NALOXONE HCL 5.7-1.4 MG SL SUBL: 1.0000 | SUBLINGUAL_TABLET | Freq: Two times a day (BID) | SUBLINGUAL | 0 refills | Status: DC

## 2018-10-01 NOTE — Progress Notes (Signed)
   10/04/2018  [name] presents for follow up of opioid use disorder I have reviewed the prior induction visit, follow up visits, and telephone encounters relevant to opiate use disorder (OUD) treatment.   Current daily dose: Zubsolv 8.6mg  daily, patient decreased her 8.6mg  BID dose to once daily  Date of Induction: 03/05/18  Current follow up interval, in weeks: 4  The patient has been adherent with the buprenorphine for OUD contract.   Last UDS Result: 04/23/2018 with unexpected results. Will repeat today  HPI: [name] is a 28 year old female who presents today for continued treatment of her chronic opioid use disorder.  She reports her pregnancy is going well and her no issues.  However she thinks that she may have been too aggressive with her decreased to ZUBSOLV 8.6 mg once daily.  She reports that a couple days that she needed to take 1-1/2 tablets to control cravings this resulted in her running out few days ago.  She thinks her most part she would continue to taper the medication if possible however she is concerned because some days cravings are increased.  Exam:   Vitals:   10/01/18 1121  BP: 117/67  Pulse: (!) 104  Temp: 98.6 F (37 C)  TempSrc: Oral  SpO2: 100%  Weight: 216 lb 14.4 oz (98.4 kg)    General: , in no acute distress Cardio: RRR, no mrg's Pulmonary: CTA bilaterally MSK: BLE nontender, nonedematous Neuro: Alert, conversational Psych: Appropriate affect  Assessment/Plan:  See Problem Based Charting in the Encounters Tab     Gust Rung, DO  10/04/2018  2:14 PM

## 2018-10-01 NOTE — Progress Notes (Signed)
NAS consult complete. Eat, Sleep, Console reviewed.  All questions answered.

## 2018-10-02 ENCOUNTER — Other Ambulatory Visit: Payer: Self-pay | Admitting: Advanced Practice Midwife

## 2018-10-02 ENCOUNTER — Telehealth: Payer: Self-pay | Admitting: Advanced Practice Midwife

## 2018-10-02 MED ORDER — PRENATAL 27-0.8 MG PO TABS: 1.0000 | ORAL_TABLET | Freq: Every day | ORAL | 3 refills | Status: DC

## 2018-10-02 NOTE — Telephone Encounter (Signed)
Patient called, requesting prenatal vitamins be called in.  [facility]  [phone]

## 2018-10-02 NOTE — Telephone Encounter (Signed)
Spoke with pt. Pt is requesting prenatal vits. Please send to pharmacy. Thanks!! [name]

## 2018-10-04 ENCOUNTER — Telehealth: Payer: Medicare Other | Admitting: Nurse Practitioner

## 2018-10-04 DIAGNOSIS — K047 Periapical abscess without sinus: Secondary | ICD-10-CM

## 2018-10-04 NOTE — Progress Notes (Signed)
Based on what you shared with me it looks like you have tooth abscess,that should be evaluated in a face to face office visit. We do not treat tooth abscess in evisit. Tooth abscess can be very serious and you need to seen ASAP. You may need antibiotic shot.   NOTE: If you entered your credit card information for this eVisit, you will not be charged. You may see a "hold" on your card for the $30 but that hold will drop off and you will not have a charge processed.  If you are having a true medical emergency please call 911.  If you need an urgent face to face visit, [facility] has four urgent care centers for your convenience.  If you need care fast and have a high deductible or no insurance consider:   [email or web address] to reserve your spot online an avoid wait times  [facility] [address] [place], [place] [address] 8 am to 8 pm Monday-Friday 10 am to 4 pm Saturday-Sunday *Across the street from [employer]  [address] [place], [address] 8 am to 5 pm Monday-Friday * In the [facility] on the [facility]   The following sites will take your  insurance:  . [facility]  [phone] Get Driving Directions Find a Provider at this Location  [address] [place], [place] [address] . 10 am to 8 pm Monday-Friday . 12 pm to 8 pm Saturday-Sunday   . [facility] at [facility]  [phone] Get Driving Directions Find a Provider at this Location  [address] [place], [place] [address] . 8 am to 8 pm Monday-Friday . 9 am to 6 pm Saturday . 11 am to 6 pm Sunday   . [facility] at [facility]  [phone] Get Driving Directions  [address] [place], [place] [address] . 8 am to 8 pm Monday-Friday . 8 am to 4 pm Saturday-Sunday   Your e-visit answers were reviewed by a board certified advanced clinical  practitioner to complete your personal care plan.  3 minutes spent reviewing and documenting in chart.

## 2018-10-04 NOTE — Assessment & Plan Note (Signed)
She likely was too aggressive in decreasing her ZUBSOLV.  We discussed not necessarily going back up to the 8.6 mg twice daily that she was on before.  What we will do instead is to increase to ZUBSOLV 5.7 mg twice daily if needed.  She will try taking the lowest dose possible to control cravings.  I will see her back in about 4 weeks.

## 2018-10-05 ENCOUNTER — Emergency Department (HOSPITAL_COMMUNITY)
Admission: EM | Admit: 2018-10-05 | Discharge: 2018-10-05 | Disposition: A | Discharge status: Home / Self Care | Payer: Medicare Other | Attending: Emergency Medicine | Admitting: Emergency Medicine

## 2018-10-05 ENCOUNTER — Encounter (HOSPITAL_COMMUNITY): Payer: Self-pay | Admitting: Emergency Medicine

## 2018-10-05 ENCOUNTER — Other Ambulatory Visit: Payer: Self-pay

## 2018-10-05 DIAGNOSIS — Z7982 Long term (current) use of aspirin: Secondary | ICD-10-CM | POA: Diagnosis not present

## 2018-10-05 DIAGNOSIS — O9989 Other specified diseases and conditions complicating pregnancy, childbirth and the puerperium: Secondary | ICD-10-CM | POA: Diagnosis not present

## 2018-10-05 DIAGNOSIS — K0889 Other specified disorders of teeth and supporting structures: Secondary | ICD-10-CM | POA: Diagnosis not present

## 2018-10-05 DIAGNOSIS — Z3A00 Weeks of gestation of pregnancy not specified: Secondary | ICD-10-CM | POA: Diagnosis not present

## 2018-10-05 DIAGNOSIS — O26892 Other specified pregnancy related conditions, second trimester: Secondary | ICD-10-CM | POA: Diagnosis not present

## 2018-10-05 DIAGNOSIS — K029 Dental caries, unspecified: Secondary | ICD-10-CM | POA: Diagnosis not present

## 2018-10-05 DIAGNOSIS — F17210 Nicotine dependence, cigarettes, uncomplicated: Secondary | ICD-10-CM | POA: Insufficient documentation

## 2018-10-05 MED ORDER — ACETAMINOPHEN 500 MG PO TABS
1000.0000 mg | ORAL_TABLET | Freq: Once | ORAL | Status: AC
  Administered 2018-10-05: 1000 mg via ORAL
  Filled 2018-10-05: qty 2

## 2018-10-05 MED ORDER — AMOXICILLIN 250 MG PO CAPS
500.0000 mg | ORAL_CAPSULE | Freq: Once | ORAL | Status: AC
  Administered 2018-10-05: 500 mg via ORAL
  Filled 2018-10-05: qty 2

## 2018-10-05 MED ORDER — AMOXICILLIN 500 MG PO CAPS: 500.0000 mg | ORAL_CAPSULE | Freq: Three times a day (TID) | ORAL | 0 refills | Status: DC

## 2018-10-05 NOTE — ED Triage Notes (Signed)
Pt reports having left upper dental pain for about 3 days.  Did evisit and was told she had to come in.

## 2018-10-05 NOTE — ED Provider Notes (Signed)
[facility] EMERGENCY DEPARTMENT Provider Note   CSN: [id number] Arrival date & time: 10/05/18  5329    History   Chief Complaint Chief Complaint  Patient presents with  . Dental Pain    HPI [name] is a 28 y.o. female.     Patient reports she is 5 months pregnant.  She spoke with her GYN about the dental issue.  She was instructed to see a dentist, but cannot see 1 over the weekend.  The history is provided by the patient.  Dental Pain  Location:  Upper Upper teeth location:  8/RU central incisor, 9/LU central incisor, 10/LU lateral incisor, 11/LU cuspid and 7/RU lateral incisor Onset quality:  Gradual Timing:  Intermittent Progression:  Worsening Context: dental caries   Associated symptoms: no neck pain     Past Medical History:  Diagnosis Date  . Bipolar disorder (HCC)   . Hepatitis C    dx 02/2018  2B  . History of heroin use   . Sinus tachycardia     Patient Active Problem List   Diagnosis Date Noted  . Supervision of normal pregnancy 07/30/2018  . Suboxone maintenance treatment complicating pregnancy, antepartum (HCC) 07/30/2018  . Hx of preeclampsia, prior pregnancy, currently pregnant 07/30/2018  . History of postpartum hemorrhage, currently pregnant 07/30/2018  . Smoker 06/26/2018  . History of cesarean section 06/26/2018  . Chronic hepatitis C virus genotype 2 infection (HCC) 03/15/2018  . Opioid use disorder (HCC) 03/05/2018  . Bipolar affective disorder (HCC) 03/05/2018  . PTSD (post-traumatic stress disorder) 03/05/2018    Past Surgical History:  Procedure Laterality Date  . ANTERIOR CRUCIATE LIGAMENT REPAIR    . APPENDECTOMY    . CESAREAN SECTION    . WISDOM TOOTH EXTRACTION       OB History    Gravida  2   Para  1   Term  1   Preterm      AB  0   Living  1     SAB      TAB      Ectopic      Multiple      Live Births  1            Home Medications    Prior to Admission medications   Medication Sig  Start Date End Date Taking? Authorizing Provider  aspirin EC 81 MG tablet Take 162 mg by mouth daily.    [provider]  Buprenorphine HCl-Naloxone HCl (ZUBSOLV) 5.7-1.4 MG SUBL Place 1 tablet under the tongue 2 (two) times daily for 30 days. 10/01/18 10/31/18  Gust Rung, DO  Prenatal Vit-Fe Fumarate-FA (MULTIVITAMIN-PRENATAL) 27-0.8 MG TABS tablet Take 1 tablet by mouth daily at 12 noon. 10/02/18   Cresenzo-Dishmon, Scarlette Calico, CNM    Family History Family History  Problem Relation Age of Onset  . Cancer Paternal Grandfather   . Heart attack Paternal Grandfather   . Suicidality Paternal Grandmother   . Anxiety disorder Maternal Grandmother   . Hypertension Maternal Grandmother   . Heart attack Maternal Grandfather   . Cancer Father   . Congenital heart disease Father   . Hypertension Mother   . Down syndrome Cousin   . Autism Brother     Social History Social History   Tobacco Use  . Smoking status: Current Every Day Smoker    Packs/day: 0.50    Types: Cigarettes  . Smokeless tobacco: Never Used  . Tobacco comment: 7-8 cigs per day  Substance  Use Topics  . Alcohol use: No  . Drug use: No    Comment: heroin clean 03/25/14     Allergies   Haldol [haloperidol lactate]   Review of Systems Review of Systems  Constitutional: Negative for activity change.       All ROS Neg except as noted in HPI  HENT: Positive for dental problem. Negative for nosebleeds.   Eyes: Negative for photophobia and discharge.  Respiratory: Negative for cough, shortness of breath and wheezing.   Cardiovascular: Negative for chest pain and palpitations.  Gastrointestinal: Negative for abdominal pain and blood in stool.  Genitourinary: Negative for dysuria, frequency and hematuria.  Musculoskeletal: Negative for arthralgias, back pain and neck pain.  Skin: Negative.   Neurological: Negative for dizziness, seizures and speech difficulty.  Psychiatric/Behavioral: Negative for confusion and  hallucinations.     Physical Exam Updated Vital Signs BP (!) 133/99 (BP Location: Left Arm)   Pulse (!) 118   Temp 98.4 F (36.9 C) (Oral)   Resp 18   Ht 5' (1.524 m)   Wt 98 kg   LMP 04/28/2018 Comment: Implant is 30 years old  SpO2 100%   BMI 42.18 kg/m   Physical Exam Vitals signs and nursing note reviewed.  Constitutional:      Appearance: She is well-developed. She is not toxic-appearing.  HENT:     Head: Normocephalic.     Comments: There are multiple cavities of the upper and lower gum area.  There is swelling of the upper gum, but no visible abscess appreciated.  The airway is patent.  There is no swelling under the tongue.    Right Ear: Tympanic membrane and external ear normal.     Left Ear: Tympanic membrane and external ear normal.  Eyes:     General: Lids are normal.     Pupils: Pupils are equal, round, and reactive to light.  Neck:     Musculoskeletal: Normal range of motion and neck supple.     Vascular: No carotid bruit.  Cardiovascular:     Rate and Rhythm: Normal rate and regular rhythm.     Pulses: Normal pulses.     Heart sounds: Normal heart sounds.  Pulmonary:     Effort: No respiratory distress.     Breath sounds: Normal breath sounds.  Abdominal:     General: Bowel sounds are normal.     Palpations: Abdomen is soft.     Tenderness: There is no abdominal tenderness. There is no guarding.  Musculoskeletal: Normal range of motion.  Lymphadenopathy:     Head:     Right side of head: No submandibular adenopathy.     Left side of head: No submandibular adenopathy.     Cervical: No cervical adenopathy.  Skin:    General: Skin is warm and dry.  Neurological:     Mental Status: She is alert and oriented to person, place, and time.     Cranial Nerves: No cranial nerve deficit.     Sensory: No sensory deficit.  Psychiatric:        Speech: Speech normal.      ED Treatments / Results  Labs (all labs ordered are listed, but only abnormal  results are displayed) Labs Reviewed - No data to display  EKG None  Radiology No results found.  Procedures Procedures (including critical care time)  Medications Ordered in ED Medications - No data to display   Initial Impression / Assessment and Plan / ED Course  I  have reviewed the triage vital signs and the nursing notes.  Pertinent labs & imaging results that were available during my care of the patient were reviewed by me and considered in my medical decision making (see chart for details).          Final Clinical Impressions(s) / ED Diagnoses MDM  Blood pressure and pulse rate are elevated.  Pulse oximetry is 100% on room air.  Within normal limits by my interpretation.  Patient has multiple dental caries of the upper and lower gum.  She has swelling of the upper gum, but no visible abscess.  No evidence for Ludewig's angina or other emergent changes.  The patient states that she is 5 months pregnant.  Patient will be started on Amoxil 3 times daily.  I have encouraged her to use Tylenol extra strength every 4 hours for discomfort.  Have also asked her to use Orajel on the gums themselves for assistance with discomfort.  I strongly encouraged her to see a dentist this week as soon as possible.  Patient is in agreement with this plan.   Final diagnoses:  Dental caries  Pain, dental    ED Discharge Orders         Ordered    amoxicillin (AMOXIL) 500 MG capsule  3 times daily     10/05/18 1030           Ivery Quale, [place] 10/05/18 1044    Sabas Sous, MD 10/05/18 1534

## 2018-10-05 NOTE — Discharge Instructions (Addendum)
You have been treated with Amoxil.  Please use this 3 times daily with food.  Please use Tylenol extra strength every 4 hours.  Orajel may be helpful also with your dental pain.  It is important that you see a dentist as soon as possible.  Please see your primary physician or return to the emergency department if any worsening of your symptoms, changes in her condition, problems, or concerns.

## 2018-10-06 ENCOUNTER — Other Ambulatory Visit: Payer: Self-pay

## 2018-10-06 ENCOUNTER — Encounter (HOSPITAL_COMMUNITY): Payer: Self-pay | Admitting: *Deleted

## 2018-10-06 ENCOUNTER — Emergency Department (HOSPITAL_COMMUNITY)
Admission: EM | Admit: 2018-10-06 | Discharge: 2018-10-06 | Disposition: A | Discharge status: Home / Self Care | Payer: Medicare Other | Attending: Emergency Medicine | Admitting: Emergency Medicine

## 2018-10-06 DIAGNOSIS — O99320 Drug use complicating pregnancy, unspecified trimester: Secondary | ICD-10-CM | POA: Insufficient documentation

## 2018-10-06 DIAGNOSIS — K0889 Other specified disorders of teeth and supporting structures: Secondary | ICD-10-CM | POA: Insufficient documentation

## 2018-10-06 DIAGNOSIS — K029 Dental caries, unspecified: Secondary | ICD-10-CM | POA: Insufficient documentation

## 2018-10-06 DIAGNOSIS — O9989 Other specified diseases and conditions complicating pregnancy, childbirth and the puerperium: Secondary | ICD-10-CM | POA: Diagnosis not present

## 2018-10-06 DIAGNOSIS — O99330 Smoking (tobacco) complicating pregnancy, unspecified trimester: Secondary | ICD-10-CM | POA: Diagnosis not present

## 2018-10-06 DIAGNOSIS — F1110 Opioid abuse, uncomplicated: Secondary | ICD-10-CM | POA: Diagnosis not present

## 2018-10-06 DIAGNOSIS — Z7982 Long term (current) use of aspirin: Secondary | ICD-10-CM | POA: Diagnosis not present

## 2018-10-06 DIAGNOSIS — F17210 Nicotine dependence, cigarettes, uncomplicated: Secondary | ICD-10-CM | POA: Diagnosis not present

## 2018-10-06 DIAGNOSIS — Z3A00 Weeks of gestation of pregnancy not specified: Secondary | ICD-10-CM | POA: Diagnosis not present

## 2018-10-06 DIAGNOSIS — O99619 Diseases of the digestive system complicating pregnancy, unspecified trimester: Secondary | ICD-10-CM | POA: Diagnosis not present

## 2018-10-06 NOTE — ED Triage Notes (Signed)
Pt returns to er with c/o swelling to left facial area from dental problems, was seen in er yesterday but states that she was not swollen then,

## 2018-10-06 NOTE — ED Provider Notes (Addendum)
[facility] EMERGENCY DEPARTMENT Provider Note   CSN: [id number] Arrival date & time: 10/06/18  2229    History   Chief Complaint Chief Complaint  Patient presents with  . Dental Pain    HPI [name] is a 28 y.o. female.     Persistent left upper anterior tooth pain.  Patient was seen in the ED yesterday and prescribed amoxicillin.  Pain is persisted.  She is pregnant and on Suboxone for previous history of opiate abuse.  She states her left cheek is now puffy and tender.  No fever, chills, stiff neck, neuro deficits.  Patient cannot take opiates.  Severity of pain is moderate to severe.     Past Medical History:  Diagnosis Date  . Bipolar disorder (HCC)   . Hepatitis C    dx 02/2018  2B  . History of heroin use   . Sinus tachycardia     Patient Active Problem List   Diagnosis Date Noted  . Supervision of normal pregnancy 07/30/2018  . Suboxone maintenance treatment complicating pregnancy, antepartum (HCC) 07/30/2018  . Hx of preeclampsia, prior pregnancy, currently pregnant 07/30/2018  . History of postpartum hemorrhage, currently pregnant 07/30/2018  . Smoker 06/26/2018  . History of cesarean section 06/26/2018  . Chronic hepatitis C virus genotype 2 infection (HCC) 03/15/2018  . Opioid use disorder (HCC) 03/05/2018  . Bipolar affective disorder (HCC) 03/05/2018  . PTSD (post-traumatic stress disorder) 03/05/2018    Past Surgical History:  Procedure Laterality Date  . ANTERIOR CRUCIATE LIGAMENT REPAIR    . APPENDECTOMY    . CESAREAN SECTION    . WISDOM TOOTH EXTRACTION       OB History    Gravida  2   Para  1   Term  1   Preterm      AB  0   Living  1     SAB      TAB      Ectopic      Multiple      Live Births  1            Home Medications    Prior to Admission medications   Medication Sig Start Date End Date Taking? Authorizing Provider  amoxicillin (AMOXIL) 500 MG capsule Take 1 capsule (500 mg total) by mouth 3  (three) times daily. 10/05/18  Yes Ivery Quale, PA-C  aspirin EC 81 MG tablet Take 162 mg by mouth daily.   Yes [provider]  Buprenorphine HCl-Naloxone HCl (ZUBSOLV) 5.7-1.4 MG SUBL Place 1 tablet under the tongue 2 (two) times daily for 30 days. 10/01/18 10/31/18 Yes Gust Rung, DO  Prenatal Vit-Fe Fumarate-FA (MULTIVITAMIN-PRENATAL) 27-0.8 MG TABS tablet Take 1 tablet by mouth daily at 12 noon. 10/02/18  Yes Cresenzo-Dishmon, Scarlette Calico, CNM    Family History Family History  Problem Relation Age of Onset  . Cancer Paternal Grandfather   . Heart attack Paternal Grandfather   . Suicidality Paternal Grandmother   . Anxiety disorder Maternal Grandmother   . Hypertension Maternal Grandmother   . Heart attack Maternal Grandfather   . Cancer Father   . Congenital heart disease Father   . Hypertension Mother   . Down syndrome Cousin   . Autism Brother     Social History Social History   Tobacco Use  . Smoking status: Current Every Day Smoker    Packs/day: 0.50    Types: Cigarettes  . Smokeless tobacco: Never Used  . Tobacco comment: 7-8 cigs  per day  Substance Use Topics  . Alcohol use: No  . Drug use: No    Comment: heroin clean 03/25/14     Allergies   Haldol [haloperidol lactate]   Review of Systems Review of Systems  All other systems reviewed and are negative.    Physical Exam Updated Vital Signs BP 124/82   Pulse 99   Temp 98.2 F (36.8 C) (Oral)   Resp 16   Ht 5' (1.524 m)   Wt 97.9 kg   LMP 04/28/2018 Comment: Implant is 30 years old  SpO2 99%   BMI 42.15 kg/m   Physical Exam Vitals signs and nursing note reviewed.  Constitutional:      Appearance: She is well-developed.  HENT:     Head: Normocephalic and atraumatic.     Comments: Caries/gingival tenderness in the distribution of #9 #10 #11 #12 teeth.  Minimal swelling of left cheek.  No evidence of meningitis. Eyes:     Conjunctiva/sclera: Conjunctivae normal.  Neck:      Musculoskeletal: Neck supple.  Cardiovascular:     Rate and Rhythm: Normal rate and regular rhythm.  Pulmonary:     Effort: Pulmonary effort is normal.     Breath sounds: Normal breath sounds.  Abdominal:     General: Bowel sounds are normal.     Palpations: Abdomen is soft.  Musculoskeletal: Normal range of motion.  Skin:    General: Skin is warm and dry.  Neurological:     Mental Status: She is alert and oriented to person, place, and time.  Psychiatric:        Behavior: Behavior normal.      ED Treatments / Results  Labs (all labs ordered are listed, but only abnormal results are displayed) Labs Reviewed - No data to display  EKG None  Radiology No results found.  Procedures Procedures (including critical care time)  Medications Ordered in ED Medications - No data to display   Initial Impression / Assessment and Plan / ED Course  I have reviewed the triage vital signs and the nursing notes.  Pertinent labs & imaging results that were available during my care of the patient were reviewed by me and considered in my medical decision making (see chart for details).        Will discuss plan with oral surgeon on-call.  0932: Discussed with oral surgeon Dr. Ocie Doyne.  He will evaluate the patient Monday morning.  Final Clinical Impressions(s) / ED Diagnoses   Final diagnoses:  Pain due to dental caries    ED Discharge Orders    None       Donnetta Hutching, MD 10/06/18 6712    Donnetta Hutching, MD 10/06/18 [phone]

## 2018-10-06 NOTE — Discharge Instructions (Addendum)
I spoke to the oral surgeon on call.  Go to his office tomorrow at 8 AM and he will work you in.  Continue Tylenol or 4 hours for pain.  Consider Orajel or ice pack.

## 2018-10-07 ENCOUNTER — Telehealth: Payer: Self-pay | Admitting: Women's Health

## 2018-10-07 ENCOUNTER — Telehealth: Payer: Self-pay | Admitting: *Deleted

## 2018-10-07 ENCOUNTER — Other Ambulatory Visit: Payer: Self-pay

## 2018-10-07 ENCOUNTER — Encounter (HOSPITAL_COMMUNITY): Payer: Self-pay | Admitting: *Deleted

## 2018-10-07 NOTE — Progress Notes (Signed)
Spoke with patient regarding her pre-op instructions.  Patient denies Cardiac, SOB or DM.  Patient is [redacted] wks gestation.  Patient has a hx herion use - last use 10/2017.  Patient is on suboxone, instructed to hold tonight's dose and tomorrow morning DOS dose.  Patient verbalized understanding.  Called MD for surgical orders.

## 2018-10-07 NOTE — Anesthesia Preprocedure Evaluation (Addendum)
Anesthesia Evaluation  Patient identified by MRN, date of birth, ID band Patient awake    Reviewed: Allergy & Precautions, NPO status , Patient's Chart, lab work & pertinent test results  Airway Mallampati: II  TM Distance: >3 FB Neck ROM: Full    Dental  (+) Poor Dentition   Pulmonary Current Smoker,    Pulmonary exam normal breath sounds clear to auscultation       Cardiovascular negative cardio ROS   Rhythm:Regular Rate:Tachycardia     Neuro/Psych PSYCHIATRIC DISORDERS Anxiety Depression Bipolar Disorder negative neurological ROS     GI/Hepatic negative GI ROS, (+)     substance abuse  , Hepatitis -, C  Endo/Other  Morbid obesity  Renal/GU negative Renal ROS     Musculoskeletal negative musculoskeletal ROS (+) narcotic dependent  Abdominal (+) + obese,   Peds  Hematology negative hematology ROS (+)   Anesthesia Other Findings Dental abscess  Reproductive/Obstetrics (+) Pregnancy [redacted] weeks pregnant  Pre and post op fetal heat tones                         Anesthesia Physical Anesthesia Plan  ASA: III  Anesthesia Plan: General   Post-op Pain Management:    Induction: Intravenous  PONV Risk Score and Plan: 2 and Ondansetron, Dexamethasone and Treatment may vary due to age or medical condition  Airway Management Planned: Oral ETT  Additional Equipment:   Intra-op Plan:   Post-operative Plan: Extubation in OR  Informed Consent:   Plan Discussed with:   Anesthesia Plan Comments: (Per PA: OR front desk notified that pt will need pre and postop fetal heart tones. Spoke to [facility] nurse, Dr Debroah Loop awake of patient in [facility].)     Anesthesia Quick Evaluation

## 2018-10-07 NOTE — Telephone Encounter (Signed)
Wants to discuss a med her dentist gave her to make sure okay to take/ she is 23 weeks

## 2018-10-07 NOTE — Telephone Encounter (Signed)
Patient states she was prescribed Clindamycin and wants to make sure it is ok to take. Informed that was fine for her to take. Verbalized understanding.

## 2018-10-08 ENCOUNTER — Encounter (HOSPITAL_COMMUNITY): Payer: Self-pay

## 2018-10-08 ENCOUNTER — Ambulatory Visit (HOSPITAL_COMMUNITY): Payer: Medicare Other | Admitting: Certified Registered"

## 2018-10-08 ENCOUNTER — Encounter (HOSPITAL_COMMUNITY)
Admission: RE | Disposition: A | Discharge status: Home / Self Care | Payer: Self-pay | Source: Home / Self Care | Attending: Oral Surgery

## 2018-10-08 ENCOUNTER — Other Ambulatory Visit: Payer: Self-pay

## 2018-10-08 ENCOUNTER — Ambulatory Visit (HOSPITAL_COMMUNITY)
Admission: RE | Admit: 2018-10-08 | Discharge: 2018-10-08 | Disposition: A | Discharge status: Home / Self Care | Payer: Medicare Other | Attending: Oral Surgery | Admitting: Oral Surgery

## 2018-10-08 DIAGNOSIS — F319 Bipolar disorder, unspecified: Secondary | ICD-10-CM | POA: Insufficient documentation

## 2018-10-08 DIAGNOSIS — M272 Inflammatory conditions of jaws: Secondary | ICD-10-CM | POA: Insufficient documentation

## 2018-10-08 DIAGNOSIS — F17200 Nicotine dependence, unspecified, uncomplicated: Secondary | ICD-10-CM | POA: Diagnosis not present

## 2018-10-08 DIAGNOSIS — O99332 Smoking (tobacco) complicating pregnancy, second trimester: Secondary | ICD-10-CM | POA: Insufficient documentation

## 2018-10-08 DIAGNOSIS — O98812 Other maternal infectious and parasitic diseases complicating pregnancy, second trimester: Secondary | ICD-10-CM | POA: Insufficient documentation

## 2018-10-08 DIAGNOSIS — Z3A22 22 weeks gestation of pregnancy: Secondary | ICD-10-CM | POA: Insufficient documentation

## 2018-10-08 DIAGNOSIS — F419 Anxiety disorder, unspecified: Secondary | ICD-10-CM | POA: Insufficient documentation

## 2018-10-08 DIAGNOSIS — O99212 Obesity complicating pregnancy, second trimester: Secondary | ICD-10-CM | POA: Insufficient documentation

## 2018-10-08 DIAGNOSIS — O99342 Other mental disorders complicating pregnancy, second trimester: Secondary | ICD-10-CM | POA: Diagnosis not present

## 2018-10-08 DIAGNOSIS — K047 Periapical abscess without sinus: Secondary | ICD-10-CM | POA: Insufficient documentation

## 2018-10-08 DIAGNOSIS — Z888 Allergy status to other drugs, medicaments and biological substances status: Secondary | ICD-10-CM | POA: Diagnosis not present

## 2018-10-08 HISTORY — DX: Major depressive disorder, single episode, unspecified: F32.9

## 2018-10-08 HISTORY — DX: Depression, unspecified: F32.A

## 2018-10-08 HISTORY — DX: Anxiety disorder, unspecified: F41.9

## 2018-10-08 HISTORY — PX: MULTIPLE EXTRACTIONS WITH ALVEOLOPLASTY: SHX5342

## 2018-10-08 LAB — COMPREHENSIVE METABOLIC PANEL
ALT: 9 U/L (ref 0–44)
ANION GAP: 9 (ref 5–15)
AST: 17 U/L (ref 15–41)
Albumin: 3 g/dL — ABNORMAL LOW (ref 3.5–5.0)
Alkaline Phosphatase: 65 U/L (ref 38–126)
BUN: 8 mg/dL (ref 6–20)
CO2: 19 mmol/L — ABNORMAL LOW (ref 22–32)
Calcium: 8.9 mg/dL (ref 8.9–10.3)
Chloride: 108 mmol/L (ref 98–111)
Creatinine, Ser: 0.4 mg/dL — ABNORMAL LOW (ref 0.44–1.00)
GFR calc Af Amer: >60 mL/min (ref >60)
GFR calc non Af Amer: >60 mL/min (ref >60)
Glucose, Bld: 83 mg/dL (ref 70–99)
Potassium: 3.2 mmol/L — ABNORMAL LOW (ref 3.5–5.1)
Sodium: 136 mmol/L (ref 135–145)
Total Protein: 6.5 g/dL (ref 6.5–8.1)

## 2018-10-08 LAB — CBC
HEMATOCRIT: 34.3 % — AB (ref 36.0–46.0)
HEMOGLOBIN: 11.4 g/dL — AB (ref 12.0–15.0)
MCH: 28.4 pg (ref 26.0–34.0)
MCHC: 33.2 g/dL (ref 30.0–36.0)
MCV: 85.3 fL (ref 80.0–100.0)
Platelets: 188 10*3/uL (ref 150–400)
RBC: 4.02 MIL/uL (ref 3.87–5.11)
RDW: 13.9 % (ref 11.5–15.5)
WBC: 10.9 10*3/uL — ABNORMAL HIGH (ref 4.0–10.5)
nRBC: 0 % (ref 0.0–0.2)

## 2018-10-08 SURGERY — MULTIPLE EXTRACTION WITH ALVEOLOPLASTY
Anesthesia: General | Site: Mouth

## 2018-10-08 MED ORDER — OXYCODONE HCL 5 MG PO TABS: 5.0000 mg | ORAL_TABLET | Freq: Once | ORAL | Status: DC | PRN

## 2018-10-08 MED ORDER — SUCCINYLCHOLINE CHLORIDE 200 MG/10ML IV SOSY
PREFILLED_SYRINGE | INTRAVENOUS | Status: DC | PRN
  Administered 2018-10-08: 100 mg via INTRAVENOUS

## 2018-10-08 MED ORDER — OXYMETAZOLINE HCL 0.05 % NA SOLN
NASAL | Status: AC
  Filled 2018-10-08: qty 30

## 2018-10-08 MED ORDER — LIDOCAINE 2% (20 MG/ML) 5 ML SYRINGE
INTRAMUSCULAR | Status: AC
  Filled 2018-10-08: qty 5

## 2018-10-08 MED ORDER — ACETAMINOPHEN 500 MG PO TABS
ORAL_TABLET | ORAL | Status: AC
  Administered 2018-10-08: 500 mg via ORAL
  Filled 2018-10-08: qty 2

## 2018-10-08 MED ORDER — HYDROMORPHONE HCL 1 MG/ML IJ SOLN
INTRAMUSCULAR | Status: AC
  Administered 2018-10-08: 0.5 mg via INTRAVENOUS
  Filled 2018-10-08: qty 1

## 2018-10-08 MED ORDER — LIDOCAINE-EPINEPHRINE 2 %-1:100000 IJ SOLN
INTRAMUSCULAR | Status: AC
  Filled 2018-10-08: qty 1

## 2018-10-08 MED ORDER — PROMETHAZINE HCL 25 MG/ML IJ SOLN: 6.2500 mg | INTRAMUSCULAR | Status: DC | PRN

## 2018-10-08 MED ORDER — FENTANYL CITRATE (PF) 100 MCG/2ML IJ SOLN
25.0000 ug | INTRAMUSCULAR | Status: DC | PRN
  Administered 2018-10-08: 50 ug via INTRAVENOUS
  Administered 2018-10-08 (×2): 25 ug via INTRAVENOUS

## 2018-10-08 MED ORDER — FENTANYL CITRATE (PF) 250 MCG/5ML IJ SOLN
INTRAMUSCULAR | Status: AC
  Filled 2018-10-08: qty 5

## 2018-10-08 MED ORDER — ACETAMINOPHEN 500 MG PO TABS
1000.0000 mg | ORAL_TABLET | Freq: Once | ORAL | Status: AC
  Administered 2018-10-08: 500 mg via ORAL

## 2018-10-08 MED ORDER — ONDANSETRON HCL 4 MG/2ML IJ SOLN
INTRAMUSCULAR | Status: DC | PRN
  Administered 2018-10-08: 4 mg via INTRAVENOUS

## 2018-10-08 MED ORDER — FENTANYL CITRATE (PF) 250 MCG/5ML IJ SOLN
INTRAMUSCULAR | Status: DC | PRN
  Administered 2018-10-08: 100 ug via INTRAVENOUS
  Administered 2018-10-08 (×2): 50 ug via INTRAVENOUS

## 2018-10-08 MED ORDER — 0.9 % SODIUM CHLORIDE (POUR BTL) OPTIME
TOPICAL | Status: DC | PRN
  Administered 2018-10-08: 1000 mL

## 2018-10-08 MED ORDER — DEXAMETHASONE SODIUM PHOSPHATE 10 MG/ML IJ SOLN
INTRAMUSCULAR | Status: AC
  Filled 2018-10-08: qty 1

## 2018-10-08 MED ORDER — LIDOCAINE 2% (20 MG/ML) 5 ML SYRINGE
INTRAMUSCULAR | Status: DC | PRN
  Administered 2018-10-08: 60 mg via INTRAVENOUS

## 2018-10-08 MED ORDER — MIDAZOLAM HCL 2 MG/2ML IJ SOLN
INTRAMUSCULAR | Status: AC
  Filled 2018-10-08: qty 2

## 2018-10-08 MED ORDER — HYDROMORPHONE HCL 1 MG/ML IJ SOLN
0.5000 mg | INTRAMUSCULAR | Status: AC | PRN
  Administered 2018-10-08 (×2): 0.5 mg via INTRAVENOUS

## 2018-10-08 MED ORDER — DEXAMETHASONE SODIUM PHOSPHATE 10 MG/ML IJ SOLN
INTRAMUSCULAR | Status: DC | PRN
  Administered 2018-10-08: 10 mg via INTRAVENOUS

## 2018-10-08 MED ORDER — PROPOFOL 10 MG/ML IV BOLUS
INTRAVENOUS | Status: DC | PRN
  Administered 2018-10-08: 200 mg via INTRAVENOUS

## 2018-10-08 MED ORDER — LIDOCAINE-EPINEPHRINE 2 %-1:100000 IJ SOLN
INTRAMUSCULAR | Status: DC | PRN
  Administered 2018-10-08: 10 mL

## 2018-10-08 MED ORDER — CLINDAMYCIN PHOSPHATE 900 MG/50ML IV SOLN
900.0000 mg | INTRAVENOUS | Status: AC
  Administered 2018-10-08: 900 mg via INTRAVENOUS

## 2018-10-08 MED ORDER — FENTANYL CITRATE (PF) 100 MCG/2ML IJ SOLN
INTRAMUSCULAR | Status: AC
  Administered 2018-10-08: 25 ug via INTRAVENOUS
  Filled 2018-10-08: qty 2

## 2018-10-08 MED ORDER — PROPOFOL 10 MG/ML IV BOLUS
INTRAVENOUS | Status: AC
  Filled 2018-10-08: qty 20

## 2018-10-08 MED ORDER — SODIUM CHLORIDE 0.9 % IV SOLN
INTRAVENOUS | Status: AC | PRN
  Administered 2018-10-08: 1000 mL

## 2018-10-08 MED ORDER — ONDANSETRON HCL 4 MG/2ML IJ SOLN
INTRAMUSCULAR | Status: AC
  Filled 2018-10-08: qty 2

## 2018-10-08 MED ORDER — OXYCODONE HCL 5 MG/5ML PO SOLN: 5.0000 mg | Freq: Once | ORAL | Status: DC | PRN

## 2018-10-08 MED ORDER — CLINDAMYCIN PHOSPHATE 900 MG/50ML IV SOLN
INTRAVENOUS | Status: AC
  Filled 2018-10-08: qty 50

## 2018-10-08 MED ORDER — LACTATED RINGERS IV SOLN
INTRAVENOUS | Status: DC | PRN
  Administered 2018-10-08: 07:00:00 via INTRAVENOUS

## 2018-10-08 SURGICAL SUPPLY — 37 items
BUR CROSS CUT FISSURE 1.6 (BURR) ×2 IMPLANT
BUR CROSS CUT FISSURE 1.6MM (BURR) ×1
BUR EGG ELITE 4.0 (BURR) ×2 IMPLANT
BUR EGG ELITE 4.0MM (BURR) ×1
CANISTER SUCT 3000ML PPV (MISCELLANEOUS) ×3 IMPLANT
COVER SURGICAL LIGHT HANDLE (MISCELLANEOUS) ×3 IMPLANT
COVER WAND RF STERILE (DRAPES) ×3 IMPLANT
CRADLE DONUT ADULT HEAD (MISCELLANEOUS) ×3 IMPLANT
DECANTER SPIKE VIAL GLASS SM (MISCELLANEOUS) IMPLANT
DRAPE U-SHAPE 76X120 STRL (DRAPES) ×3 IMPLANT
GAUZE PACKING FOLDED 2  STR (GAUZE/BANDAGES/DRESSINGS) ×2
GAUZE PACKING FOLDED 2 STR (GAUZE/BANDAGES/DRESSINGS) ×1 IMPLANT
GLOVE BIO SURGEON STRL SZ 6.5 (GLOVE) IMPLANT
GLOVE BIO SURGEON STRL SZ7.5 (GLOVE) ×3 IMPLANT
GLOVE BIO SURGEONS STRL SZ 6.5 (GLOVE)
GLOVE BIOGEL PI IND STRL 6.5 (GLOVE) IMPLANT
GLOVE BIOGEL PI IND STRL 7.0 (GLOVE) IMPLANT
GLOVE BIOGEL PI INDICATOR 6.5 (GLOVE)
GLOVE BIOGEL PI INDICATOR 7.0 (GLOVE)
GOWN STRL REUS W/ TWL LRG LVL3 (GOWN DISPOSABLE) ×1 IMPLANT
GOWN STRL REUS W/ TWL XL LVL3 (GOWN DISPOSABLE) ×1 IMPLANT
GOWN STRL REUS W/TWL LRG LVL3 (GOWN DISPOSABLE) ×2
GOWN STRL REUS W/TWL XL LVL3 (GOWN DISPOSABLE) ×2
IV NS 1000ML (IV SOLUTION) ×2
IV NS 1000ML BAXH (IV SOLUTION) ×1 IMPLANT
KIT BASIN OR (CUSTOM PROCEDURE TRAY) ×3 IMPLANT
KIT TURNOVER KIT B (KITS) ×3 IMPLANT
NEEDLE 22X1 1/2 (OR ONLY) (NEEDLE) ×6 IMPLANT
NEEDLE 27GAX1X1/2 (NEEDLE) IMPLANT
NS IRRIG 1000ML POUR BTL (IV SOLUTION) ×3 IMPLANT
PAD ARMBOARD 7.5X6 YLW CONV (MISCELLANEOUS) ×3 IMPLANT
SLEEVE IRRIGATION ELITE 7 (MISCELLANEOUS) ×3 IMPLANT
SUT CHROMIC 3 0 PS 2 (SUTURE) ×3 IMPLANT
SYR CONTROL 10ML LL (SYRINGE) ×3 IMPLANT
TRAY ENT MC OR (CUSTOM PROCEDURE TRAY) ×3 IMPLANT
TUBING IRRIGATION (MISCELLANEOUS) ×3 IMPLANT
YANKAUER SUCT BULB TIP NO VENT (SUCTIONS) ×3 IMPLANT

## 2018-10-08 NOTE — Progress Notes (Signed)
Has edematous l lower eyelid/ facial edema/ dr ellender aware, will evaluate / this is same side of face where abscess. OB GYN response nurse notified for fetal monitoring

## 2018-10-08 NOTE — Anesthesia Procedure Notes (Signed)
Procedure Name: Intubation Date/Time: 10/08/2018 7:47 AM Performed by: Bryson Corona, CRNA Pre-anesthesia Checklist: Patient identified, Emergency Drugs available, Suction available and Patient being monitored Patient Re-evaluated:Patient Re-evaluated prior to induction Oxygen Delivery Method: Circle System Utilized Preoxygenation: Pre-oxygenation with 100% oxygen Induction Type: IV induction Ventilation: Mask ventilation without difficulty Laryngoscope Size: Mac and 3 Grade View: Grade I Tube type: Oral Tube size: 7.0 mm Number of attempts: 1 Airway Equipment and Method: Stylet and Oral airway Placement Confirmation: ETT inserted through vocal cords under direct vision,  positive ETCO2 and breath sounds checked- equal and bilateral Secured at: 21 cm Tube secured with: Tape Dental Injury: Teeth and Oropharynx as per pre-operative assessment

## 2018-10-08 NOTE — H&P (Signed)
HISTORY AND PHYSICAL  [name] is a 28 y.o. female patient with CC: left facial swelling. Dental pain.  HPI: Pt seen in [place] ER 10/05/18 and 10/06/18 for dental pain and swelling. Placed on amoxicillin but no relief in swelling. Seen in office yesterday but unable to obtain adequate anesthesia for procedure. Recommended hospital for GA.   No diagnosis found.  Past Medical History:  Diagnosis Date  . Anxiety   . Bipolar disorder (HCC)   . Depression   . Hepatitis C    dx 02/2018  2B  . History of heroin use    clean since 10/2017  . Sinus tachycardia     Current Facility-Administered Medications  Medication Dose Route Frequency Provider Last Rate Last Dose  . clindamycin (CLEOCIN) 900 MG/50ML IVPB           . clindamycin (CLEOCIN) IVPB 900 mg  900 mg Intravenous On Call to OR Ocie Doyne, DDS       Allergies  Allergen Reactions  . Haldol [Haloperidol Lactate] Other (See Comments)    Stiff neck    Active Problems:   * No active hospital problems. *  Vitals: Blood pressure 116/77, pulse (!) 106, temperature 98.1 F (36.7 C), temperature source Oral, height 4' 11.25" (1.505 m), weight 98 kg, last menstrual period 04/28/2018, SpO2 93 %. Lab results: Results for orders placed or performed during the hospital encounter of 10/08/18 (from the past 24 hour(s))  CBC     Status: Abnormal   Collection Time: 10/08/18  6:28 AM  Result Value Ref Range   WBC 10.9 (H) 4.0 - 10.5 K/uL   RBC 4.02 3.87 - 5.11 MIL/uL   Hemoglobin 11.4 (L) 12.0 - 15.0 g/dL   HCT 47.8 (L) 29.5 - 62.1 %   MCV 85.3 80.0 - 100.0 fL   MCH 28.4 26.0 - 34.0 pg   MCHC 33.2 30.0 - 36.0 g/dL   RDW 30.8 65.7 - 84.6 %   Platelets 188 150 - 400 K/uL   nRBC 0.0 0.0 - 0.2 %  Comprehensive metabolic panel     Status: Abnormal   Collection Time: 10/08/18  6:28 AM  Result Value Ref Range   Sodium 136 135 - 145 mmol/L   Potassium 3.2 (L) 3.5 - 5.1 mmol/L   Chloride 108 98 - 111 mmol/L   CO2 19 (L) 22 - 32  mmol/L   Glucose, Bld 83 70 - 99 mg/dL   BUN 8 6 - 20 mg/dL   Creatinine, Ser 9.62 (L) 0.44 - 1.00 mg/dL   Calcium 8.9 8.9 - 95.2 mg/dL   Total Protein 6.5 6.5 - 8.1 g/dL   Albumin 3.0 (L) 3.5 - 5.0 g/dL   AST 17 15 - 41 U/L   ALT 9 0 - 44 U/L   Alkaline Phosphatase 65 38 - 126 U/L   Total Bilirubin <0.1 (L) 0.3 - 1.2 mg/dL   GFR calc non Af Amer >60 >60 mL/min   GFR calc Af Amer >60 >60 mL/min   Anion gap 9 5 - 15   Radiology Results: No results found. General appearance: alert, cooperative and no distress Head: Normocephalic, without obvious abnormality, atraumatic Eyes: negative Nose: Nares normal. Septum midline. Mucosa normal. No drainage or sinus tenderness. Throat: lips, mucosa, and tongue normal; teeth and gums normal and moderate left cheek edema. maxillary buccal vestibule fullneess. Multiple grossly carious teeth. Pharynx clear. Neck: no adenopathy, supple, symmetrical, trachea midline and thyroid not enlarged, symmetric, no tenderness/mass/nodules  Resp: clear to auscultation bilaterally Cardio: regular rate and rhythm, S1, S2 normal, no murmur, click, rub or gallop  Assessment: 28 yo female [redacted] weeks pregnant with left canine space infection, non-restorable teeth.   Plan: I and D left maxilla, removal nonrestorable teeth. GA. Day surgery.   Ocie Doyne 10/08/2018

## 2018-10-08 NOTE — Anesthesia Postprocedure Evaluation (Signed)
Anesthesia Post Note  Patient: [name]  Procedure(s) Performed: EXTRACTION  OF TEETH #10 and #11 WITH INCISION AND DRAINAGE (N/A Mouth)     Patient location during evaluation: PACU Anesthesia Type: General Level of consciousness: awake and alert Pain management: pain level controlled Vital Signs Assessment: post-procedure vital signs reviewed and stable Respiratory status: spontaneous breathing, nonlabored ventilation, respiratory function stable and patient connected to nasal cannula oxygen Cardiovascular status: blood pressure returned to baseline and stable Postop Assessment: no apparent nausea or vomiting Anesthetic complications: no    Last Vitals:  Vitals:   10/08/18 0930 10/08/18 0937  BP: 131/88 136/88  Pulse: 73 69  Resp: 16 14  Temp:  36.6 C  SpO2: 94% 93%    Last Pain:  Vitals:   10/08/18 0935  TempSrc:   PainSc: 6                  Ryan P Ellender

## 2018-10-08 NOTE — Op Note (Signed)
10/08/2018  8:05 AM  PATIENT:  [name]  28 y.o. female  PRE-OPERATIVE DIAGNOSIS:  Left canine space infection, abscessed teeth # 10, 11  POST-OPERATIVE DIAGNOSIS:  SAME  PROCEDURE:  Procedure(s): EXTRACTION  OF TEETH #10 and #11 WITH INCISION AND DRAINAGE Left canine space infection  SURGEON:  Surgeon(s): Ocie Doyne, DDS  ANESTHESIA:   local and general  EBL:  minimal  DRAINS: none   SPECIMEN:  No Specimen  COUNTS:  YES  PLAN OF CARE: Discharge to home after PACU  PATIENT DISPOSITION:  PACU - hemodynamically stable.   PROCEDURE DETAILS: Dictation # [id number]  Georgia Lopes, DMD 10/08/2018 8:05 AM

## 2018-10-08 NOTE — Op Note (Signed)
NAMESAMORA, HAUX MEDICAL RECORD EZ:[id number] ACCOUNT [id number] DATE OF BIRTH:Nov 27, 1990 FACILITY: [facility] LOCATION: [facility] PHYSICIAN:Carrisa Keller M. Connor Foxworthy, DDS  OPERATIVE REPORT  DATE OF PROCEDURE:  10/08/2018  PREOPERATIVE DIAGNOSIS:  Left canine space infection abscessed teeth numbers 10 and 11.  POSTOPERATIVE DIAGNOSIS:  Left canine space infection abscessed teeth numbers 10 and 11.  PROCEDURE:  Extraction teeth 10 and 11.  Incision and drainage, left canine space infection.  SURGEON:  Ocie Doyne, DDS  ANESTHESIA:  Oral general, Dr. Bradley Ferris attending.  INDICATIONS:  The patient is a 28 year old female who is [redacted] weeks pregnant, who presented to the office yesterday for evaluation of swelling and pain in the left maxilla.  She had previously been to the ER at [facility] for 2 consecutive days prior to  being seen in the office.  Radiographs were taken and it was noted that she had multiple nonrestorable teeth with abscesses at the most tender teeth and the ones involved in the current infection appeared to be numbers 10 and 11.  The patient was given  local anesthesia, approximately 10 mL and a small incision was made; however, due to the patient's discomfort and inability to tolerate the procedure, the office procedure was terminated and the patient was scheduled for general anesthesia at the  hospital.  DESCRIPTION OF PROCEDURE:  The patient was taken to the operating room and placed on the table in supine position.  General anesthesia was administered.  An oral endotracheal tube was placed and secured.  The eyes were protected.  The patient was draped  for surgery.  A timeout was performed.  The posterior pharynx was suctioned and a throat pack was placed, 2% lidocaine, 1:100,000 epinephrine was infiltrated buccally and palatally in the left maxilla total of 10 mL was utilized.  A bite block was placed  on the right side of the mouth and a sweetheart retractor was used to retract  the tongue.  A #15 blade was used to make an incision in the buccal vestibule overlying tooth 11.  Then, dissection was carried down to the bone and carried upwards towards  the infraorbital nerve.  There was no frank pus, but there was some serosanguineous exudate.  Then, an incision was made with a #15 blade around teeth 10 and 11 in the gingival sulcus.  The periosteum was reflected.  The teeth were elevated and removed  with the dental forceps.  The sockets were curetted, irrigated, and then a quarter inch Penrose drain was placed into the buccal vestibule incision site and sutured to the mucosa with a 3-0 silk suture.  Then, the oral cavity was irrigated and suctioned  and a throat pack was removed.  The patient was left in care of anesthesia for extubation and transport to recovery room with plans for discharge home through day surgery.  ESTIMATED BLOOD LOSS:  Minimal.  COMPLICATIONS:  None.  SPECIMENS:  None.  TN/NUANCE  D:10/08/2018 T:10/08/2018 JOB:[id number]

## 2018-10-08 NOTE — Progress Notes (Signed)
Fetal heart tones 140-144 bpm by doppler by Maudry Mayhew, RN from ED. Erin, RN OB Rapid Response to bedside to verify fetal heart tones. States she will report findings to Dr. Debroah Loop.

## 2018-10-08 NOTE — Progress Notes (Signed)
[name] is a G2P1, at 22wk2da. OB Rapid Response saw her to doppler fetal heart tones today, prior to surgery.  Fetal heart tones 150bpm to 165bpm by doppler. Dr Debroah Loop notified of patient, and fetal heart tones.   Please notify [facility]BR[facility] RN [phone]) when patient is in PACU for follow-up fetal heart tone doppler.

## 2018-10-08 NOTE — Transfer of Care (Signed)
Immediate Anesthesia Transfer of Care Note  Patient: [name]  Procedure(s) Performed: EXTRACTION  OF TEETH #10 and #11 WITH INCISION AND DRAINAGE (N/A Mouth)  Patient Location: PACU  Anesthesia Type:General  Level of Consciousness: awake, alert  and oriented  Airway & Oxygen Therapy: Patient Spontanous Breathing and Patient connected to nasal cannula oxygen  Post-op Assessment: Report given to RN and Post -op Vital signs reviewed and stable  Post vital signs: Reviewed and stable  Last Vitals:  Vitals Value Taken Time  BP 129/74 10/08/2018  8:15 AM  Temp    Pulse 92 10/08/2018  8:19 AM  Resp 13 10/08/2018  8:19 AM  SpO2 95 % 10/08/2018  8:19 AM  Vitals shown include unvalidated device data.  Last Pain:  Vitals:   10/08/18 0626  TempSrc:   PainSc: 5       Patients Stated Pain Goal: 3 (10/08/18 3704)  Complications: No apparent anesthesia complications

## 2018-10-09 ENCOUNTER — Telehealth: Payer: Self-pay | Admitting: *Deleted

## 2018-10-09 ENCOUNTER — Encounter (HOSPITAL_COMMUNITY): Payer: Self-pay | Admitting: Oral Surgery

## 2018-10-09 ENCOUNTER — Telehealth: Payer: Self-pay | Admitting: Women's Health

## 2018-10-09 NOTE — Telephone Encounter (Signed)
Patient states she had dental surgery yesterday and is having nausea from the antibiotics. She is requesting something for nausea.  Please advise.

## 2018-10-09 NOTE — Telephone Encounter (Signed)
Patient called stating that she would like a call back from a nurse. Pt did not state the reason why. Please contact pt

## 2018-10-10 ENCOUNTER — Other Ambulatory Visit: Payer: Self-pay | Admitting: Women's Health

## 2018-10-10 MED ORDER — PROMETHAZINE HCL 25 MG PO TABS: 12.5000 mg | ORAL_TABLET | Freq: Four times a day (QID) | ORAL | 0 refills | Status: DC | PRN

## 2018-10-22 ENCOUNTER — Other Ambulatory Visit: Payer: Self-pay | Admitting: Obstetrics and Gynecology

## 2018-10-22 ENCOUNTER — Telehealth: Payer: Self-pay | Admitting: *Deleted

## 2018-10-22 DIAGNOSIS — O4440 Low lying placenta NOS or without hemorrhage, unspecified trimester: Secondary | ICD-10-CM

## 2018-10-22 NOTE — Telephone Encounter (Signed)
Patient informed that we are not allowing visitors or children to come to appointments at this time. Patient denies any contact with anyone suspected or confirmed of having COVID-19. Pt denies fever, cough, sob, muscle pain, diarrhea, rash, vomiting, abdominal pain, red eye, weakness, bruising or bleeding, joint pain or severe headache.  

## 2018-10-23 ENCOUNTER — Other Ambulatory Visit: Payer: Self-pay

## 2018-10-23 ENCOUNTER — Encounter: Payer: Self-pay | Admitting: Obstetrics and Gynecology

## 2018-10-23 ENCOUNTER — Encounter: Payer: Medicare Other | Admitting: Women's Health

## 2018-10-23 ENCOUNTER — Ambulatory Visit (INDEPENDENT_AMBULATORY_CARE_PROVIDER_SITE_OTHER): Payer: Medicare Other | Admitting: Obstetrics and Gynecology

## 2018-10-23 ENCOUNTER — Ambulatory Visit (INDEPENDENT_AMBULATORY_CARE_PROVIDER_SITE_OTHER): Payer: Medicare Other

## 2018-10-23 ENCOUNTER — Encounter: Payer: Self-pay | Admitting: Women's Health

## 2018-10-23 DIAGNOSIS — O4440 Low lying placenta NOS or without hemorrhage, unspecified trimester: Secondary | ICD-10-CM

## 2018-10-23 DIAGNOSIS — O99320 Drug use complicating pregnancy, unspecified trimester: Secondary | ICD-10-CM

## 2018-10-23 DIAGNOSIS — O99322 Drug use complicating pregnancy, second trimester: Secondary | ICD-10-CM | POA: Diagnosis not present

## 2018-10-23 DIAGNOSIS — F1120 Opioid dependence, uncomplicated: Secondary | ICD-10-CM

## 2018-10-23 DIAGNOSIS — Z3A24 24 weeks gestation of pregnancy: Secondary | ICD-10-CM

## 2018-10-23 DIAGNOSIS — O09292 Supervision of pregnancy with other poor reproductive or obstetric history, second trimester: Secondary | ICD-10-CM

## 2018-10-23 DIAGNOSIS — Z3482 Encounter for supervision of other normal pregnancy, second trimester: Secondary | ICD-10-CM

## 2018-10-23 DIAGNOSIS — O09299 Supervision of pregnancy with other poor reproductive or obstetric history, unspecified trimester: Secondary | ICD-10-CM

## 2018-10-23 DIAGNOSIS — O4442 Low lying placenta NOS or without hemorrhage, second trimester: Secondary | ICD-10-CM

## 2018-10-23 DIAGNOSIS — Z3402 Encounter for supervision of normal first pregnancy, second trimester: Secondary | ICD-10-CM

## 2018-10-23 NOTE — Progress Notes (Addendum)
Patient ID: [name], female   DOB: 09/02/1990, 28 y.o.   MRN: [id number]    TELEHEALTH VIRTUAL OBSTETRICS VISIT ENCOUNTER NOTE  I connected with [name] on 10/23/2018 at  3:45 PM EDT by telephone at home and verified that I am speaking with the correct person using two identifiers.   I discussed the limitations, risks, security and privacy concerns of performing an evaluation and management service by telephone and the availability of in person appointments. I also discussed with the patient that there may be a patient responsible charge related to this service. The patient expressed understanding and agreed to proceed. Subjective:  DANAYA KITCHEL is a 28 y.o. G2P1001 at [redacted]w[redacted]d being followed for ongoing prenatal care.  She is currently monitored for the following issues for this low-risk pregnancy. Had c-section with her 1st pregnancy and wants to VBAC. Is taking zubsolv due drug usage 12 mg.  Was taking 20 mg before she found out she was pregnant and winded down to 12. She hopes to go down to 8 mg. has Opioid use disorder (HCC); Bipolar affective disorder (HCC); PTSD (post-traumatic stress disorder); Chronic hepatitis C virus genotype 2 infection (HCC); Smoker; History of cesarean section; Supervision of normal pregnancy; Suboxone maintenance treatment complicating pregnancy, antepartum (HCC); Hx of preeclampsia, prior pregnancy, currently pregnant; and History of postpartum hemorrhage, currently pregnant on their problem list.  Patient reports no complaints. Reports fetal movement. Denies any contractions, bleeding or leaking of fluid.   Was scheduled for office visit but left before seeing provider due to family emergency. Was seen for u/s today: US TV 24+3 wks,cephalic,posterior low lying placenta gr 0,tip of placenta to cx 1.9 cm,cx 3.8 cm,FHR 164 bpm,normal ovaries bilat  The following portions of the patient's history were reviewed and updated as appropriate: allergies, current  medications, past family history, past medical history, past social history, past surgical history and problem list.   Objective:   General:  Alert, oriented and cooperative.   Mental Status: Normal mood and affect perceived. Normal judgment and thought content.  Rest of physical exam deferred due to type of encounter  Assessment and Plan:  Pregnancy: G2P1001 at [redacted]w[redacted]d  P:  1. Take tylenol for tooth pain  Preterm labor symptoms and general obstetric precautions including but not limited to vaginal bleeding, contractions, leaking of fluid and fetal movement were reviewed in detail with the patient.  I discussed the assessment and treatment plan with the patient. The patient was provided an opportunity to ask questions and all were answered. The patient agreed with the plan and demonstrated an understanding of the instructions. The patient was advised to call back or seek an in-person office evaluation/go to [facility]U at [facility] for any urgent or concerning symptoms. Please refer to After Visit Summary for other counseling recommendations.   I provided 20 plus minutes of non-face-to-face time during this encounter.  Return in about 3 weeks (around 11/13/2018) for U/s: anatomy.  Future Appointments  Date Time Provider Department Center  10/29/2018 10:45 AM [facility]    By signing my name below, I, Arnette Norris, attest that this documentation has been prepared under the direction and in the presence of Tilda Burrow, MD. Electronically Signed: Arnette Norris Medical Scribe. 10/23/18. 4:24 PM.  I personally performed the services described in this documentation, which was SCRIBED in my presence. The recorded information has been reviewed and considered accurate. It has been edited as necessary during review. Tilda Burrow,  MD

## 2018-10-23 NOTE — Progress Notes (Signed)
US TV 24+3 wks,cephalic,posterior low lying placenta gr 0,tip of placenta to cx 1.9 cm,cx 3.8 cm,FHR 164 bpm,normal ovaries bilat

## 2018-10-30 ENCOUNTER — Telehealth: Payer: Self-pay

## 2018-10-30 ENCOUNTER — Encounter: Payer: Self-pay | Admitting: *Deleted

## 2018-10-30 MED ORDER — BUPRENORPHINE HCL-NALOXONE HCL 5.7-1.4 MG SL SUBL: 1.0000 | SUBLINGUAL_TABLET | Freq: Two times a day (BID) | SUBLINGUAL | 0 refills | Status: DC

## 2018-10-30 NOTE — Telephone Encounter (Signed)
Please call pt back regarding an appt for OUD.

## 2018-10-30 NOTE — Telephone Encounter (Signed)
Called [name] to discuss her medication.  Confirmed identity with name and DOB.   Serenitie reports that she is doing well.  She has been using her suboxone and not having many symptoms of withdrawal or cravings.  She is using 12mg /day and sometimes takes an extra 1/2 dose.  She is trying to limit her use as she is [redacted] weeks pregnant.  We briefly discussed pain control at the time of her C section.  She would prefer to stay on Suboxone.  I advised her to use high dose ibuprofen and ask her OB about other options.   She continue to remain clean and is doing well.  No pressured speech, mood normal.   Plan Refill 1 month supply of buprenorphine.  Will need call back for clinic appointment once the clinic back up and running in May or June.   Debe Coder, MD

## 2018-11-19 ENCOUNTER — Encounter: Payer: Self-pay | Admitting: *Deleted

## 2018-11-19 ENCOUNTER — Other Ambulatory Visit: Payer: Self-pay | Admitting: Obstetrics and Gynecology

## 2018-11-19 ENCOUNTER — Encounter: Payer: Self-pay | Admitting: Internal Medicine

## 2018-11-19 DIAGNOSIS — O4440 Low lying placenta NOS or without hemorrhage, unspecified trimester: Secondary | ICD-10-CM

## 2018-11-20 ENCOUNTER — Ambulatory Visit (INDEPENDENT_AMBULATORY_CARE_PROVIDER_SITE_OTHER): Payer: Medicare Other | Admitting: Women's Health

## 2018-11-20 ENCOUNTER — Ambulatory Visit (INDEPENDENT_AMBULATORY_CARE_PROVIDER_SITE_OTHER): Payer: Medicare Other

## 2018-11-20 ENCOUNTER — Encounter: Payer: Self-pay | Admitting: Women's Health

## 2018-11-20 ENCOUNTER — Encounter: Payer: Self-pay | Admitting: Licensed Clinical Social Worker

## 2018-11-20 ENCOUNTER — Other Ambulatory Visit: Payer: Self-pay

## 2018-11-20 ENCOUNTER — Telehealth: Payer: Self-pay | Admitting: Licensed Clinical Social Worker

## 2018-11-20 ENCOUNTER — Other Ambulatory Visit: Payer: Medicare Other

## 2018-11-20 VITALS — BP 115/76 | HR 100 | Temp 98.5°F | Wt 214.5 lb

## 2018-11-20 DIAGNOSIS — Z3483 Encounter for supervision of other normal pregnancy, third trimester: Secondary | ICD-10-CM | POA: Diagnosis not present

## 2018-11-20 DIAGNOSIS — B182 Chronic viral hepatitis C: Secondary | ICD-10-CM | POA: Diagnosis not present

## 2018-11-20 DIAGNOSIS — F1120 Opioid dependence, uncomplicated: Secondary | ICD-10-CM

## 2018-11-20 DIAGNOSIS — O4440 Low lying placenta NOS or without hemorrhage, unspecified trimester: Secondary | ICD-10-CM

## 2018-11-20 DIAGNOSIS — Z3A28 28 weeks gestation of pregnancy: Secondary | ICD-10-CM | POA: Diagnosis not present

## 2018-11-20 DIAGNOSIS — Z23 Encounter for immunization: Secondary | ICD-10-CM

## 2018-11-20 DIAGNOSIS — O99320 Drug use complicating pregnancy, unspecified trimester: Secondary | ICD-10-CM

## 2018-11-20 DIAGNOSIS — Z1389 Encounter for screening for other disorder: Secondary | ICD-10-CM

## 2018-11-20 DIAGNOSIS — Z331 Pregnant state, incidental: Secondary | ICD-10-CM

## 2018-11-20 DIAGNOSIS — Z3402 Encounter for supervision of normal first pregnancy, second trimester: Secondary | ICD-10-CM

## 2018-11-20 LAB — POCT URINALYSIS DIPSTICK OB
Blood, UA: NEGATIVE
Glucose, UA: NEGATIVE
Ketones, UA: NEGATIVE
Leukocytes, UA: NEGATIVE
Nitrite, UA: NEGATIVE
POC,PROTEIN,UA: NEGATIVE

## 2018-11-20 NOTE — Telephone Encounter (Signed)
Patient was contacted due to a message from her PCP. Patient did not answer, and voicemail was left for the patient to contact our office to schedule an appointment. A letter will also be mailed to the patient.

## 2018-11-20 NOTE — Progress Notes (Signed)
[place] TA/TV:28+3 wks,cephalic,posterior placenta gr 0,tip of placenta is 3 cm from cervix,cx 4.1 cm,SVP of fluid  4.4 cm,EFW 1216 g 35%,fhr 137 bpm

## 2018-11-20 NOTE — Progress Notes (Signed)
LOW-RISK PREGNANCY VISIT Patient name: [name] MRN [id number]  Date of birth: 04-27-1991 Chief Complaint:   Routine Prenatal Visit (PN2, US today)  History of Present Illness:   [name] is a 28 y.o. 302P1001 female at 8633w3d with an Estimated Date of Delivery: 02/09/19 being seen today for ongoing management of a low-risk pregnancy. On Zubsolv 2.9mg  TID, Hep C, h/o pre-e, prev c/s w/ PPH Today she reports no complaints. Contractions: Not present. Vag. Bleeding: None.  Movement: Present. denies leaking of fluid. Review of Systems:   Pertinent items are noted in HPI Denies abnormal vaginal discharge w/ itching/odor/irritation, headaches, visual changes, shortness of breath, chest pain, abdominal pain, severe nausea/vomiting, or problems with urination or bowel movements unless otherwise stated above. Pertinent History Reviewed:  Reviewed past medical,surgical, social, obstetrical and family history.  Reviewed problem list, medications and allergies. Physical Assessment:   Vitals:   11/20/18 0924  BP: 115/76  Pulse: 100  Temp: 98.5 F (36.9 C)  Weight: 214 lb 8 oz (97.3 kg)  Body mass index is 42.96 kg/m.        Physical Examination:   General appearance: Well appearing, and in no distress  Mental status: Alert, oriented to person, place, and time  Skin: Warm & dry  Cardiovascular: Normal heart rate noted  Respiratory: Normal respiratory effort, no distress  Abdomen: Soft, gravid, nontender  Pelvic: Cervical exam deferred         Extremities: Edema: None  Fetal Status: Fetal Heart Rate (bpm): 137 u/s   Movement: Present     US today to re-evaluate low-lying placenta:  TA/TV:28+3 wks,cephalic,posterior placenta gr 0,tip of placenta is 3 cm from cervix,cx 4.1 cm,SVP of fluid  4.4 cm,EFW 1216 g 35%,fhr 137 bpm  Results for orders placed or performed in visit on 11/20/18 (from the past 24 hour(s))  POC Urinalysis Dipstick OB   Collection Time: 11/20/18  9:17 AM  Result  Value Ref Range   Color, UA     Clarity, UA     Glucose, UA Negative Negative   Bilirubin, UA     Ketones, UA neg    Spec Grav, UA     Blood, UA neg    pH, UA     POC,PROTEIN,UA Negative Negative, Trace, Small (1+), Moderate (2+), Large (3+), 4+   Urobilinogen, UA     Nitrite, UA neg    Leukocytes, UA Negative Negative   Appearance     Odor      Assessment & Plan:  1) Low-risk pregnancy G2P1001 at 2433w3d with an Estimated Date of Delivery: 02/09/19   2) Low-lying placenta RESOLVED> now 3cm from os  3) Zubsolv> 2.9mg  TID  4) Hep C> will check LFTs and viral load today  5) H/O pre-e> continue ASA 162mg    6) Prev c/s> wants VBAC  7) H/O PPH   Meds: No orders of the defined types were placed in this encounter.  Labs/procedures today: pn2, tdap, u/s  Plan:  Continue routine obstetrical care   Reviewed: Preterm labor symptoms and general obstetric precautions including but not limited to vaginal bleeding, contractions, leaking of fluid and fetal movement were reviewed in detail with the patient.  All questions were answered. Has home bp cuff, check weekly, let us know if >140/90  Follow-up: Return in about 4 weeks (around 12/18/2018) for LROB webex.  Orders Placed This Encounter  Procedures  . Tdap vaccine greater than or equal to 7yo IM  . Comprehensive metabolic panel  .  HCV RNA quant  . POC Urinalysis Dipstick OB   Cheral Marker CNM, WHNP-BC 11/20/2018 10:00 AM

## 2018-11-20 NOTE — Patient Instructions (Signed)
[name], I greatly value your feedback.  If you receive a survey following your visit with [place] today, we appreciate you taking the time to fill it out.  Thanks, [name], CNM, [facility]  [facility] HAS MOVED!!! It is now [facility] ([address] [place], [place] [address]) Entrance located off of [address] Free 24/7 valet parking    Call the office [phone]) or go to [facility] if:  You begin to have strong, frequent contractions  Your water breaks.  Sometimes it is a big gush of fluid, sometimes it is just a trickle that keeps getting your panties wet or running down your legs  You have vaginal bleeding.  It is normal to have a small amount of spotting if your cervix was checked.   You don't feel your baby moving like normal.  If you don't, get you something to eat and drink and lay down and focus on feeling your baby move.  You should feel at least 10 movements in 2 hours.  If you don't, you should call the office or go to [facility].    Tdap Vaccine  It is recommended that you get the Tdap vaccine during the third trimester of EACH pregnancy to help protect your baby from getting pertussis (whooping cough)  27-36 weeks is the BEST time to do this so that you can pass the protection on to your baby. During pregnancy is better than after pregnancy, but if you are unable to get it during pregnancy it will be offered at the hospital.   You can get this vaccine with [place], at the health department, your family doctor, or some local pharmacies  Everyone who will be around your baby should also be up-to-date on their vaccines before the baby comes. Adults (who are not pregnant) only need 1 dose of Tdap during adulthood.   Home Blood Pressure Monitoring for Patients   Your provider has recommended that you check your blood pressure (BP) at least once a week at home. If you do not have a blood pressure cuff at home, one will be provided  for you. Contact your provider if you have not received your monitor within 1 week.   Helpful Tips for Accurate Home Blood Pressure Checks  . Don't smoke, exercise, or drink caffeine 30 minutes before checking your BP . Use the restroom before checking your BP (a full bladder can raise your pressure) . Relax in a comfortable upright chair . Feet on the ground . Left arm resting comfortably on a flat surface at the level of your heart . Legs uncrossed . Back supported . Sit quietly and don't talk . Place the cuff on your bare arm . Adjust snuggly, so that only two fingertips can fit between your skin and the top of the cuff . Check 2 readings separated by at least one minute . Keep a log of your BP readings . For a visual, please reference this diagram: http://ccnc.care/bpdiagram  Provider Name: [facility]     Phone: [phone]  Zone 1: ALL CLEAR  Continue to monitor your symptoms:  . BP reading is less than 140 (top number) or less than 90 (bottom number)  . No right upper stomach pain . No headaches or seeing spots . No feeling nauseated or throwing up . No swelling in face and hands  Zone 2: CAUTION Call your doctor's office for any of the following:  . BP reading  is greater than 140 (top number) or greater than 90 (bottom number)  . Stomach pain under your ribs in the middle or right side . Headaches or seeing spots . Feeling nauseated or throwing up . Swelling in face and hands  Zone 3: EMERGENCY  Seek immediate medical care if you have any of the following:  . BP reading is greater than160 (top number) or greater than 110 (bottom number) . Severe headaches not improving with Tylenol . Serious difficulty catching your breath . Any worsening symptoms from Zone 2    Third Trimester of Pregnancy The third trimester is from week 29 through week 42, months 7 through 9. The third trimester is a time when the fetus is growing rapidly. At the end of the ninth month,  the fetus is about 20 inches in length and weighs 6-10 pounds.  BODY CHANGES Your body goes through many changes during pregnancy. The changes vary from woman to woman.   Your weight will continue to increase. You can expect to gain 25-35 pounds (11-16 kg) by the end of the pregnancy.  You may begin to get stretch marks on your hips, abdomen, and breasts.  You may urinate more often because the fetus is moving lower into your pelvis and pressing on your bladder.  You may develop or continue to have heartburn as a result of your pregnancy.  You may develop constipation because certain hormones are causing the muscles that push waste through your intestines to slow down.  You may develop hemorrhoids or swollen, bulging veins (varicose veins).  You may have pelvic pain because of the weight gain and pregnancy hormones relaxing your joints between the bones in your pelvis. Backaches may result from overexertion of the muscles supporting your posture.  You may have changes in your hair. These can include thickening of your hair, rapid growth, and changes in texture. Some women also have hair loss during or after pregnancy, or hair that feels dry or thin. Your hair will most likely return to normal after your baby is born.  Your breasts will continue to grow and be tender. A yellow discharge may leak from your breasts called colostrum.  Your belly button may stick out.  You may feel short of breath because of your expanding uterus.  You may notice the fetus "dropping," or moving lower in your abdomen.  You may have a bloody mucus discharge. This usually occurs a few days to a week before labor begins.  Your cervix becomes thin and soft (effaced) near your due date. WHAT TO EXPECT AT YOUR PRENATAL EXAMS  You will have prenatal exams every 2 weeks until week 36. Then, you will have weekly prenatal exams. During a routine prenatal visit:  You will be weighed to make sure you and the fetus  are growing normally.  Your blood pressure is taken.  Your abdomen will be measured to track your baby's growth.  The fetal heartbeat will be listened to.  Any test results from the previous visit will be discussed.  You may have a cervical check near your due date to see if you have effaced. At around 36 weeks, your caregiver will check your cervix. At the same time, your caregiver will also perform a test on the secretions of the vaginal tissue. This test is to determine if a type of bacteria, Group B streptococcus, is present. Your caregiver will explain this further. Your caregiver may ask you:  What your birth plan is.  How you  are feeling.  If you are feeling the baby move.  If you have had any abnormal symptoms, such as leaking fluid, bleeding, severe headaches, or abdominal cramping.  If you have any questions. Other tests or screenings that may be performed during your third trimester include:  Blood tests that check for low iron levels (anemia).  Fetal testing to check the health, activity level, and growth of the fetus. Testing is done if you have certain medical conditions or if there are problems during the pregnancy. FALSE LABOR You may feel small, irregular contractions that eventually go away. These are called Braxton Hicks contractions, or false labor. Contractions may last for hours, days, or even weeks before true labor sets in. If contractions come at regular intervals, intensify, or become painful, it is best to be seen by your caregiver.  SIGNS OF LABOR   Menstrual-like cramps.  Contractions that are 5 minutes apart or less.  Contractions that start on the top of the uterus and spread down to the lower abdomen and back.  A sense of increased pelvic pressure or back pain.  A watery or bloody mucus discharge that comes from the vagina. If you have any of these signs before the 37th week of pregnancy, call your caregiver right away. You need to go to the  hospital to get checked immediately. HOME CARE INSTRUCTIONS   Avoid all smoking, herbs, alcohol, and unprescribed drugs. These chemicals affect the formation and growth of the baby.  Follow your caregiver's instructions regarding medicine use. There are medicines that are either safe or unsafe to take during pregnancy.  Exercise only as directed by your caregiver. Experiencing uterine cramps is a good sign to stop exercising.  Continue to eat regular, healthy meals.  Wear a good support bra for breast tenderness.  Do not use hot tubs, steam rooms, or saunas.  Wear your seat belt at all times when driving.  Avoid raw meat, uncooked cheese, cat litter boxes, and soil used by cats. These carry germs that can cause birth defects in the baby.  Take your prenatal vitamins.  Try taking a stool softener (if your caregiver approves) if you develop constipation. Eat more high-fiber foods, such as fresh vegetables or fruit and whole grains. Drink plenty of fluids to keep your urine clear or pale yellow.  Take warm sitz baths to soothe any pain or discomfort caused by hemorrhoids. Use hemorrhoid cream if your caregiver approves.  If you develop varicose veins, wear support hose. Elevate your feet for 15 minutes, 3-4 times a day. Limit salt in your diet.  Avoid heavy lifting, wear low heal shoes, and practice good posture.  Rest a lot with your legs elevated if you have leg cramps or low back pain.  Visit your dentist if you have not gone during your pregnancy. Use a soft toothbrush to brush your teeth and be gentle when you floss.  A sexual relationship may be continued unless your caregiver directs you otherwise.  Do not travel far distances unless it is absolutely necessary and only with the approval of your caregiver.  Take prenatal classes to understand, practice, and ask questions about the labor and delivery.  Make a trial run to the hospital.  Pack your hospital bag.  Prepare  the baby's nursery.  Continue to go to all your prenatal visits as directed by your caregiver. SEEK MEDICAL CARE IF:  You are unsure if you are in labor or if your water has broken.  You have dizziness.  You have mild pelvic cramps, pelvic pressure, or nagging pain in your abdominal area.  You have persistent nausea, vomiting, or diarrhea.  You have a bad smelling vaginal discharge.  You have pain with urination. SEEK IMMEDIATE MEDICAL CARE IF:   You have a fever.  You are leaking fluid from your vagina.  You have spotting or bleeding from your vagina.  You have severe abdominal cramping or pain.  You have rapid weight loss or gain.  You have shortness of breath with chest pain.  You notice sudden or extreme swelling of your face, hands, ankles, feet, or legs.  You have not felt your baby move in over an hour.  You have severe headaches that do not go away with medicine.  You have vision changes. Document Released: 07/04/2001 Document Revised: 07/15/2013 Document Reviewed: 09/10/2012 [facility] Patient Information 2015 [place], [place]. This information is not intended to replace advice given to you by your health care provider. Make sure you discuss any questions you have with your health care provider.  Coronavirus (COVID-19) Are you at risk?  Are you at risk for the Coronavirus (COVID-19)?  To be considered HIGH RISK for Coronavirus (COVID-19), you have to meet the following criteria:  . Traveled to [place], [place], [place], [place] or [place]; or in the [place] to [place], [place], [place], or [place]; and have fever, cough, and shortness of breath within the last 2 weeks of travel OR . Been in close contact with a person diagnosed with COVID-19 within the last 2 weeks and have fever, cough, and shortness of breath . IF YOU DO NOT MEET THESE CRITERIA, YOU ARE CONSIDERED LOW RISK FOR COVID-19.  What to do if you are HIGH RISK for COVID-19?  Marland Kitchen If you  are having a medical emergency, call 911. . Seek medical care right away. Before you go to a doctor's office, urgent care or emergency department, call ahead and tell them about your recent travel, contact with someone diagnosed with COVID-19, and your symptoms. You should receive instructions from your physician's office regarding next steps of care.  . When you arrive at healthcare provider, tell the healthcare staff immediately you have returned from visiting [place], [place], [place], [place] or [place]; or traveled in the [place] to [place], [place], [place], or [place]; in the last two weeks or you have been in close contact with a person diagnosed with COVID-19 in the last 2 weeks.   . Tell the health care staff about your symptoms: fever, cough and shortness of breath. . After you have been seen by a medical provider, you will be either: o Tested for (COVID-19) and discharged home on quarantine except to seek medical care if symptoms worsen, and asked to  - Stay home and avoid contact with others until you get your results (4-5 days)  - Avoid travel on public transportation if possible (such as bus, train, or airplane) or o Sent to the Emergency Department by EMS for evaluation, COVID-19 testing, and possible admission depending on your condition and test results.  What to do if you are LOW RISK for COVID-19?  Reduce your risk of any infection by using the same precautions used for avoiding the common cold or flu:  Marland Kitchen Wash your hands often with soap and warm water for at least 20 seconds.  If soap and water are not readily available, use an alcohol-based hand sanitizer with at least 60% alcohol.  . If coughing or  sneezing, cover your mouth and nose by coughing or sneezing into the elbow areas of your shirt or coat, into a tissue or into your sleeve (not your hands). . Avoid shaking hands with others and consider head nods or verbal greetings only. . Avoid touching your eyes, nose,  or mouth with unwashed hands.  . Avoid close contact with people who are sick. . Avoid places or events with large numbers of people in one location, like concerts or sporting events. . Carefully consider travel plans you have or are making. . If you are planning any travel outside or inside the [place], visit the CDC's Travelers' Health webpage for the latest health notices. . If you have some symptoms but not all symptoms, continue to monitor at home and seek medical attention if your symptoms worsen. . If you are having a medical emergency, call 911.   [place] / e-Visit: [email or web address]         [facility]: [facility]: [phone]                   [facility]: [phone]

## 2018-11-21 LAB — GLUCOSE TOLERANCE, 2 HOURS W/ 1HR
Glucose, 1 hour: 86 mg/dL (ref 65–179)
Glucose, 2 hour: 83 mg/dL (ref 65–152)
Glucose, Fasting: 69 mg/dL (ref 65–91)

## 2018-11-21 LAB — CBC
Hematocrit: 34.1 % (ref 34.0–46.6)
Hemoglobin: 11.4 g/dL (ref 11.1–15.9)
MCH: 27.5 pg (ref 26.6–33.0)
MCHC: 33.4 g/dL (ref 31.5–35.7)
MCV: 82 fL (ref 79–97)
Platelets: 178 10*3/uL (ref 150–450)
RBC: 4.15 x10E6/uL (ref 3.77–5.28)
RDW: 13.4 % (ref 11.7–15.4)
WBC: 16.7 10*3/uL — ABNORMAL HIGH (ref 3.4–10.8)

## 2018-11-21 LAB — RPR: RPR Ser Ql: NONREACTIVE

## 2018-11-21 LAB — HIV ANTIBODY (ROUTINE TESTING W REFLEX): HIV Screen 4th Generation wRfx: NONREACTIVE

## 2018-11-21 LAB — ANTIBODY SCREEN: Antibody Screen: NEGATIVE

## 2018-11-23 LAB — COMPREHENSIVE METABOLIC PANEL
ALT: 8 IU/L (ref 0–32)
AST: 20 IU/L (ref 0–40)
Albumin/Globulin Ratio: 1.4 (ref 1.2–2.2)
Albumin: 3.9 g/dL (ref 3.9–5.0)
Alkaline Phosphatase: 92 IU/L (ref 39–117)
BUN/Creatinine Ratio: 14 (ref 9–23)
BUN: 5 mg/dL — ABNORMAL LOW (ref 6–20)
Bilirubin Total: <0.2 mg/dL (ref 0.0–1.2)
CO2: 18 mmol/L — ABNORMAL LOW (ref 20–29)
Calcium: 8.2 mg/dL — ABNORMAL LOW (ref 8.7–10.2)
Chloride: 102 mmol/L (ref 96–106)
Creatinine, Ser: 0.35 mg/dL — ABNORMAL LOW (ref 0.57–1.00)
GFR calc Af Amer: 170 mL/min/{1.73_m2} (ref >59)
GFR calc non Af Amer: 148 mL/min/{1.73_m2} (ref >59)
Globulin, Total: 2.8 g/dL (ref 1.5–4.5)
Glucose: 90 mg/dL (ref 65–99)
Potassium: 3.8 mmol/L (ref 3.5–5.2)
Sodium: 136 mmol/L (ref 134–144)
Total Protein: 6.7 g/dL (ref 6.0–8.5)

## 2018-11-23 LAB — HCV RNA QUANT
HCV log10: 5.833 {Log_IU}/mL
Hepatitis C Quantitation: 680000 [IU]/mL

## 2018-11-26 ENCOUNTER — Other Ambulatory Visit: Payer: Self-pay | Admitting: Internal Medicine

## 2018-11-27 ENCOUNTER — Encounter: Payer: Self-pay | Admitting: Internal Medicine

## 2018-11-27 MED ORDER — BUPRENORPHINE HCL-NALOXONE HCL 5.7-1.4 MG SL SUBL: 1.0000 | SUBLINGUAL_TABLET | Freq: Two times a day (BID) | SUBLINGUAL | 0 refills | Status: DC

## 2018-11-27 NOTE — Telephone Encounter (Signed)
Called [name], she is doing well, reports recent ultrasound looks great and reports OB is pleased with her progress so far.  Cravings remain controlled on Zubsolv 5.7 BID dose.

## 2018-12-18 ENCOUNTER — Encounter: Payer: Self-pay | Admitting: Obstetrics and Gynecology

## 2018-12-18 ENCOUNTER — Ambulatory Visit (INDEPENDENT_AMBULATORY_CARE_PROVIDER_SITE_OTHER): Payer: Medicare Other | Admitting: Obstetrics and Gynecology

## 2018-12-18 ENCOUNTER — Other Ambulatory Visit: Payer: Self-pay

## 2018-12-18 VITALS — BP 120/77 | HR 98

## 2018-12-18 DIAGNOSIS — Z3403 Encounter for supervision of normal first pregnancy, third trimester: Secondary | ICD-10-CM

## 2018-12-18 DIAGNOSIS — Z3A32 32 weeks gestation of pregnancy: Secondary | ICD-10-CM

## 2018-12-18 NOTE — Progress Notes (Signed)
Patient ID: [name], female   DOB: 1991/03/21, 28 y.o.   MRN: [id number]    TELEHEALTH OBSTETRICS PRENATAL VIRTUAL VIDEO VISIT ENCOUNTER NOTE  [facility]   I connected with [name] on 12/18/2018 at  9:00 AM EDT by WebEx Video Encounter at home and verified that I am speaking with the correct person using two identifiers.   I discussed the limitations, risks, security and privacy concerns of performing an evaluation and management service by telephone and the availability of in person appointments. I also discussed with the patient that there may be a patient responsible charge related to this service. The patient expressed understanding and agreed to proceed. Subjective:  Sydney BoopSarah A [name] is a 28 y.o. G2P1001 at 601w3d being seen today for ongoing prenatal care.  She is currently monitored for the following issues for this low-risk pregnancy previous c-section, Hep C, hx of pre-e and has Opioid use disorder (HCC); Bipolar affective disorder (HCC); PTSD (post-traumatic stress disorder); Chronic hepatitis C virus genotype 2 infection (HCC); Smoker; History of cesarean section; Supervision of normal pregnancy; Suboxone maintenance treatment complicating pregnancy, antepartum (HCC); Hx of preeclampsia, prior pregnancy, currently pregnant; and History of postpartum hemorrhage, currently pregnant on their problem list. Patient confirms that she signed VBAC forms.  She is wanting to avoid opiates as much as possible if cesarean is required and will mention this again should she require cesarean delivery Patient reports no complaints.  Contractions: Regular.  .  Movement: Present. Denies any leaking of fluid.   The following portions of the patient's history were reviewed and updated as appropriate: allergies, current medications, past family history, past medical history, past social history, past surgical history and problem list.   Objective:    Vitals:   12/18/18 0928  BP: 120/77  Pulse: 98    Fetal Status:     Movement: Present     General:  Alert, oriented and cooperative. Patient is in no acute distress.  Respiratory: Normal respiratory effort, no problems with respiration noted  Mental Status: Normal mood and affect. Normal behavior. Normal judgment and thought content.  Rest of physical exam deferred due to type of encounter  Imaging: [place] Ob Follow Up  Result Date: 11/21/2018 FOLLOW UP SONOGRAM Sydney BoopSarah A Hoback is in the office for a follow up sonogram to check placenta location.. She is a 28 y.o. year old G2P1001 with Estimated Date of Delivery: 02/09/19 by early ultrasound now at  589w3d weeks gestation. Thus far the pregnancy has been complicated by low lying placenta,opioid use,smoker,hx of preeclampsia prior preg. GESTATION: SINGLETON PRESENTATION: cephalic FETAL ACTIVITY:          Heart rate         137          The fetus is active. AMNIOTIC FLUID: The amniotic fluid volume is  normal, 4.4 cm.svp PLACENTA LOCALIZATION:  posterior GRADE 0 CERVIX: Measures 4 cm ADNEXA: The ovaries are normal. GESTATIONAL AGE AND  BIOMETRICS: Gestational criteria: Estimated Date of Delivery: 02/09/19 by early ultrasound now at 569w3d Previous Scans:5          BIPARIETAL DIAMETER           7.08 cm         28+3 weeks HEAD CIRCUMFERENCE           26.58 cm         28+6 weeks ABDOMINAL CIRCUMFERENCE  24.28 cm         28+3 weeks FEMUR LENGTH           5.24 cm         27+6 weeks                                                       AVERAGE EGA(BY THIS SCAN):  28+2 weeks                                                 ESTIMATED FETAL WEIGHT:       1216  grams, 35 % ANATOMICAL SURVEY                                                                            COMMENTS CEREBRAL VENTRICLES yes normal  CHOROID PLEXUS yes normal  CEREBELLUM yes normal  CISTERNA MAGNA yes normal  NUCHAL REGION yes normal  ORBITS yes normal  NASAL BONE yes normal  NOSE/LIP yes  normal  FACIAL PROFILE yes normal  4 CHAMBERED HEART yes normal  OUTFLOW TRACTS yes normal  DIAPHRAGM yes normal  STOMACH yes normal  RENAL REGION yes normal  BLADDER yes normal  CORD INSERTION yes normal  3 VESSEL CORD yes normal  SPINE yes normal          GENITALIA   female     SUSPECTED ABNORMALITIES:  no QUALITY OF SCAN: satisfactory TECHNICIAN COMMENTS: [place] TA/TV:28+3 wks,cephalic,posterior placenta gr 0,tip of placenta is 3 cm from cervix,cx 4.1 cm,SVP of fluid  4.4 cm,EFW 1216 g 35%,fhr 137 bpm A copy of this report including all images has been saved and backed up to a second source for retrieval if needed. All measures and details of the anatomical scan, placentation, fluid volume and pelvic anatomy are contained in that report. [name] 11/20/2018 9:28 AM Clinical Impression and recommendations: I have reviewed the sonogram results above, combined with the patient's current clinical course, below are my impressions and any appropriate recommendations for management based on the sonographic findings. 1.  G2P1001 Estimated Date of Delivery: 02/09/19 by  early ultrasound, midtrimester ultrasound and confirmed by today's sonographic dating 2.  Normal fetal sonographic findings, specifically normal detailed anatomical evaluation,      no abnormalities noted 3.  Normal general sonographic findings Placenta no longer a clinical issue Recommend routine prenatal care based on this sonogram or as clinically indicated Lazaro Arms 11/21/2018 10:28 AM   US Ob Transvaginal  Result Date: 11/21/2018 FOLLOW UP SONOGRAM [name] is in the office for a follow up sonogram to check placenta location.. She is a 28 y.o. year old G2P1001 with Estimated Date of Delivery: 02/09/19 by early ultrasound now at  [redacted]w[redacted]d weeks gestation. Thus far the pregnancy has been complicated by low lying placenta,opioid use,smoker,hx of preeclampsia prior preg. GESTATION: SINGLETON PRESENTATION: cephalic FETAL ACTIVITY:  Heart rate          137          The fetus is active. AMNIOTIC FLUID: The amniotic fluid volume is  normal, 4.4 cm.svp PLACENTA LOCALIZATION:  posterior GRADE 0 CERVIX: Measures 4 cm ADNEXA: The ovaries are normal. GESTATIONAL AGE AND  BIOMETRICS: Gestational criteria: Estimated Date of Delivery: 02/09/19 by early ultrasound now at [redacted]w[redacted]d Previous Scans:5          BIPARIETAL DIAMETER           7.08 cm         28+3 weeks HEAD CIRCUMFERENCE           26.58 cm         28+6 weeks ABDOMINAL CIRCUMFERENCE           24.28 cm         28+3 weeks FEMUR LENGTH           5.24 cm         27+6 weeks                                                       AVERAGE EGA(BY THIS SCAN):  28+2 weeks                                                 ESTIMATED FETAL WEIGHT:       1216  grams, 35 % ANATOMICAL SURVEY                                                                            COMMENTS CEREBRAL VENTRICLES yes normal  CHOROID PLEXUS yes normal  CEREBELLUM yes normal  CISTERNA MAGNA yes normal  NUCHAL REGION yes normal  ORBITS yes normal  NASAL BONE yes normal  NOSE/LIP yes normal  FACIAL PROFILE yes normal  4 CHAMBERED HEART yes normal  OUTFLOW TRACTS yes normal  DIAPHRAGM yes normal  STOMACH yes normal  RENAL REGION yes normal  BLADDER yes normal  CORD INSERTION yes normal  3 VESSEL CORD yes normal  SPINE yes normal          GENITALIA   female     SUSPECTED ABNORMALITIES:  no QUALITY OF SCAN: satisfactory TECHNICIAN COMMENTS: [place] TA/TV:28+3 wks,cephalic,posterior placenta gr 0,tip of placenta is 3 cm from cervix,cx 4.1 cm,SVP of fluid  4.4 cm,EFW 1216 g 35%,fhr 137 bpm A copy of this report including all images has been saved and backed up to a second source for retrieval if needed. All measures and details of the anatomical scan, placentation, fluid volume and pelvic anatomy are contained in that report. [name] 11/20/2018 9:28 AM Clinical Impression and recommendations: I have reviewed the sonogram results above, combined with the  patient's current clinical course, below are my impressions and any appropriate recommendations for management based on the sonographic findings. 1.  G2P1001 Estimated Date of  Delivery: 02/09/19 by  early ultrasound, midtrimester ultrasound and confirmed by today's sonographic dating 2.  Normal fetal sonographic findings, specifically normal detailed anatomical evaluation,      no abnormalities noted 3.  Normal general sonographic findings Placenta no longer a clinical issue Recommend routine prenatal care based on this sonogram or as clinically indicated Lazaro Arms 11/21/2018 10:28 AM    Assessment and Plan:  Pregnancy: G2P1001 at [redacted]w[redacted]d Previous cesarean desiring TOLAC, with consent signed Opiate use disorder, stable on 1-1/2 bar of Zubsolv daily managed by Dr. Oswaldo Done and Dr. Sabino Gasser of [facility] History of HSV-2 quiescesent since age 29    Preterm labor symptoms and general obstetric precautions including but not limited to vaginal bleeding, contractions, leaking of fluid and fetal movement were reviewed in detail with the patient. I discussed the assessment and treatment plan with the patient. The patient was provided an opportunity to ask questions and all were answered. The patient agreed with the plan and demonstrated an understanding of the instructions. The patient was advised to call back or seek an in-person office evaluation/go to [facility]U at [facility] for any urgent or concerning symptoms. Please refer to After Visit Summary for other counseling recommendations.   I provided 15 minutes of face-to-face time during this encounter.  No follow-ups on file.  No future appointments.  By signing my name below, I, Arnette Norris, attest that this documentation has been prepared under the direction and in the presence of Tilda Burrow, MD. Electronically Signed: Arnette Norris Medical Scribe. 12/18/18. 9:39 AM.  I personally performed the services described  in this documentation, which was SCRIBED in my presence. The recorded information has been reviewed and considered accurate. It has been edited as necessary during review. Tilda Burrow, MD

## 2018-12-22 ENCOUNTER — Encounter: Payer: Self-pay | Admitting: Internal Medicine

## 2018-12-24 ENCOUNTER — Other Ambulatory Visit: Payer: Self-pay

## 2018-12-24 ENCOUNTER — Ambulatory Visit (INDEPENDENT_AMBULATORY_CARE_PROVIDER_SITE_OTHER): Payer: Medicare Other | Admitting: Student in an Organized Health Care Education/Training Program

## 2018-12-24 VITALS — BP 121/75 | HR 111 | Temp 99.3°F | Wt 224.5 lb

## 2018-12-24 DIAGNOSIS — F1199 Opioid use, unspecified with unspecified opioid-induced disorder: Secondary | ICD-10-CM

## 2018-12-24 DIAGNOSIS — Z3A00 Weeks of gestation of pregnancy not specified: Secondary | ICD-10-CM

## 2018-12-24 DIAGNOSIS — O99323 Drug use complicating pregnancy, third trimester: Secondary | ICD-10-CM

## 2018-12-24 DIAGNOSIS — R011 Cardiac murmur, unspecified: Secondary | ICD-10-CM | POA: Diagnosis not present

## 2018-12-24 DIAGNOSIS — Z720 Tobacco use: Secondary | ICD-10-CM | POA: Diagnosis not present

## 2018-12-24 DIAGNOSIS — F1120 Opioid dependence, uncomplicated: Secondary | ICD-10-CM

## 2018-12-24 DIAGNOSIS — F1190 Opioid use, unspecified, uncomplicated: Secondary | ICD-10-CM

## 2018-12-24 MED ORDER — BUPRENORPHINE HCL-NALOXONE HCL 8.6-2.1 MG SL SUBL: 1.0000 | SUBLINGUAL_TABLET | Freq: Two times a day (BID) | SUBLINGUAL | 0 refills | Status: DC

## 2018-12-24 NOTE — Assessment & Plan Note (Signed)
Having some symptoms of withdrawal on the reduced dose of ZUBSOLV.  No relapses, no other drug use.  She is very concerned about the wellness of her pregnancy.  It is possible that she has decreased absorption or protein handling of ibuprofen since becoming pregnant.  There was also a recent decrease in her dose as she wanted to be on as minimal doses possible.  Plan is to increase her dose back up to the 8.6 mg ZUBSOLV film and take 2/day.  We talked about how the most important thing was to prevent relapses for her health and the health of the pregnancy.  There is also some concern about management of the ZUBSOLV around the time of the C-section.  My recommendation is to continue with the ZUBSOLV at its current dose.  Opioid pain medications can still be added on in addition to the ZUBSOLV, would start with a high potency opioid like IV Dilaudid at 1-2 mg as needed.  Obviously would also maximize other non-opioid pain management like Tylenol, NSAIDs, and epidural.  I will write this up to his treatment plan and send it to her obstetrician in [place].

## 2018-12-24 NOTE — Patient Instructions (Signed)
You are doing great!  We will increase your dose of Zubsolv back to 8.6mg  two per day.   We will write a letter with a suggested treatment plan for your pain and ZUBSOLV around the time of the surgery.  I will get this letter to you by the end of the week.

## 2018-12-24 NOTE — Progress Notes (Signed)
   12/24/2018  [name] presents for follow up of opioid use disorder I have reviewed the prior induction visit, follow up visits, and telephone encounters relevant to opiate use disorder (OUD) treatment.   Current daily dose: Zubsolv 5.7 mg twice daily, patient increased to 2 1/2 films per day   Date of Induction: 03/05/2018  Current follow up interval, in weeks: 4 weeks   The patient has been adherent with the buprenorphine for OUD contract.   Last UDS Result: 09/03/2018  HPI: [name] is a 28 year old female who presents today for continued treatment of her chronic opioid use disorder.She is in her third trimester of pregnancy and doing well. Her Zubsolv dose is 5.7 mg BID. She usually splits her film doses in 3: AM, noon, and afternoon. For the past month she has had to increase her dose to 2 1/2 films per day as she is experiencing diffuse body aches in the morning. Denies stressors ar home.   Exam:   Vitals:   12/24/18 0904  BP: 121/75  Pulse: (!) 111  Temp: 99.3 F (37.4 C)  TempSrc: Oral  SpO2: 99%  Weight: 224 lb 8 oz (101.8 kg)   General: young female, well-appearing, pregnant, in no acute distress  CV: tachycardic, nl S1/S2, soft systolic murmur best heard at upper sternal borders  Pulm: CTAB, no wheezes or crackles  Ext: trace edema bilaterally   Assessment/Plan:  See Problem Based Charting in the Encounters Tab    Burna Cash, MD  12/24/2018  9:28 AM

## 2018-12-24 NOTE — Progress Notes (Signed)
Internal Medicine Clinic Attending  I saw and evaluated the patient.  I personally confirmed the key portions of the history and exam documented by Dr. Santos-Sanchez and I reviewed pertinent patient test results.  The assessment, diagnosis, and plan were formulated together and I agree with the documentation in the resident's note. 

## 2018-12-25 ENCOUNTER — Encounter: Payer: Self-pay | Admitting: Student in an Organized Health Care Education/Training Program

## 2018-12-30 ENCOUNTER — Encounter: Payer: Self-pay | Admitting: *Deleted

## 2018-12-30 LAB — TOXASSURE SELECT,+ANTIDEPR,UR

## 2019-01-02 ENCOUNTER — Encounter: Payer: Self-pay | Admitting: Obstetrics & Gynecology

## 2019-01-02 ENCOUNTER — Ambulatory Visit (INDEPENDENT_AMBULATORY_CARE_PROVIDER_SITE_OTHER): Payer: Medicare Other | Admitting: Obstetrics & Gynecology

## 2019-01-02 VITALS — BP 144/88 | HR 100

## 2019-01-02 DIAGNOSIS — Z3A34 34 weeks gestation of pregnancy: Secondary | ICD-10-CM

## 2019-01-02 DIAGNOSIS — B182 Chronic viral hepatitis C: Secondary | ICD-10-CM

## 2019-01-02 DIAGNOSIS — O99323 Drug use complicating pregnancy, third trimester: Secondary | ICD-10-CM

## 2019-01-02 DIAGNOSIS — F1120 Opioid dependence, uncomplicated: Secondary | ICD-10-CM

## 2019-01-02 DIAGNOSIS — O98413 Viral hepatitis complicating pregnancy, third trimester: Secondary | ICD-10-CM

## 2019-01-02 DIAGNOSIS — Z3403 Encounter for supervision of normal first pregnancy, third trimester: Secondary | ICD-10-CM

## 2019-01-02 DIAGNOSIS — O99320 Drug use complicating pregnancy, unspecified trimester: Secondary | ICD-10-CM

## 2019-01-02 NOTE — Progress Notes (Signed)
Patient ID: [name], female   DOB: 08/04/1990, 28 y.o.   MRN: [id number]   Webex VIRTUAL OBSTETRICS VISIT ENCOUNTER NOTE  I connected with [name] on 01/02/19 at 10:30 AM EDT by webex at home and verified that I am speaking with the correct person using two identifiers.   I discussed the limitations, risks, security and privacy concerns of performing an evaluation and management service by telephone and the availability of in person appointments. I also discussed with the patient that there may be a patient responsible charge related to this service. The patient expressed understanding and agreed to proceed.  Subjective:  [name] is a 28 y.o. G2P1001 at [redacted]w[redacted]d being followed for ongoing prenatal care.  She is currently monitored for the following issues for this low-risk pregnancy and has Opioid use disorder ([place]); Bipolar affective disorder ([place]); PTSD (post-traumatic stress disorder); Chronic hepatitis C virus genotype 2 infection ([place]); Smoker; History of cesarean section; Supervision of normal pregnancy; Suboxone maintenance treatment complicating pregnancy, antepartum ([place]); Hx of preeclampsia, prior pregnancy, currently pregnant; and History of postpartum hemorrhage, currently pregnant on their problem list.  Patient reports no complaints. Reports fetal movement. Denies any contractions, bleeding or leaking of fluid.   The following portions of the patient's history were reviewed and updated as appropriate: allergies, current medications, past family history, past medical history, past social history, past surgical history and problem list.   Objective:   General:  Alert, oriented and cooperative.   Mental Status: Normal mood and affect perceived. Normal judgment and thought content.  Rest of physical exam deferred due to type of encounter  Assessment and Plan:  Pregnancy: G2P1001 at [redacted]w[redacted]d 1. Encounter for supervision of normal first pregnancy in third trimester   2.  Chronic hepatitis C without hepatic coma (HCC)   3. Suboxone maintenance treatment complicating pregnancy, antepartum ([place])   Term labor symptoms and general obstetric precautions including but not limited to vaginal bleeding, contractions, leaking of fluid and fetal movement were reviewed in detail with the patient.  I discussed the assessment and treatment plan with the patient. The patient was provided an opportunity to ask questions and all were answered. The patient agreed with the plan and demonstrated an understanding of the instructions. The patient was advised to call back or seek an in-person office evaluation/go to [facility] at [facility] for any urgent or concerning symptoms. Please refer to After Visit Summary for other counseling recommendations.   I provided 14 minutes of non-face-to-face time during this encounter.  Return in about 1 week (around 01/09/2019) for LROB, HCV quant, GBS.  Future Appointments  Date Time Provider [place]  01/09/2019 12:00 PM Florian Buff, MD [facility]  01/21/2019  9:45 AM IMP-OUD IMP-IMCR [place], MD [facility] for [facility], [place]

## 2019-01-08 ENCOUNTER — Encounter: Payer: Self-pay | Admitting: *Deleted

## 2019-01-09 ENCOUNTER — Other Ambulatory Visit: Payer: Self-pay

## 2019-01-09 ENCOUNTER — Ambulatory Visit (INDEPENDENT_AMBULATORY_CARE_PROVIDER_SITE_OTHER): Payer: Medicare Other | Admitting: Obstetrics & Gynecology

## 2019-01-09 VITALS — BP 143/90 | HR 109 | Wt 223.0 lb

## 2019-01-09 DIAGNOSIS — Z3A35 35 weeks gestation of pregnancy: Secondary | ICD-10-CM | POA: Diagnosis not present

## 2019-01-09 DIAGNOSIS — Z3403 Encounter for supervision of normal first pregnancy, third trimester: Secondary | ICD-10-CM | POA: Diagnosis not present

## 2019-01-09 DIAGNOSIS — B182 Chronic viral hepatitis C: Secondary | ICD-10-CM | POA: Diagnosis not present

## 2019-01-09 DIAGNOSIS — O133 Gestational [pregnancy-induced] hypertension without significant proteinuria, third trimester: Secondary | ICD-10-CM

## 2019-01-09 DIAGNOSIS — O98413 Viral hepatitis complicating pregnancy, third trimester: Secondary | ICD-10-CM

## 2019-01-09 NOTE — Progress Notes (Signed)
   HIGH-RISK PREGNANCY VISIT Patient name: [name] MRN [id number]  Date of birth: March 08, 1991 Chief Complaint:   Routine Prenatal Visit  History of Present Illness:   [name] is a 28 y.o. G76P1001 female at 55w4dwith an Estimated Date of Delivery: 02/09/19 being seen today for ongoing management of a high-risk pregnancy complicated by gestational HTN.  Today she reports no complaints. Contractions: Irritability. Vag. Bleeding: None.  Movement: Present. denies leaking of fluid.  Review of Systems:   Pertinent items are noted in HPI Denies abnormal vaginal discharge w/ itching/odor/irritation, headaches, visual changes, shortness of breath, chest pain, abdominal pain, severe nausea/vomiting, or problems with urination or bowel movements unless otherwise stated above. Pertinent History Reviewed:  Reviewed past medical,surgical, social, obstetrical and family history.  Reviewed problem list, medications and allergies. Physical Assessment:   Vitals:   01/09/19 1218  BP: (!) 143/90  Pulse: (!) 109  Weight: 223 lb (101.2 kg)  Body mass index is 44.66 kg/m.           Physical Examination:   General appearance: alert, well appearing, and in no distress  Mental status: alert, oriented to person, place, and time  Skin: warm & dry   Extremities: Edema: None    Cardiovascular: normal heart rate noted  Respiratory: normal respiratory effort, no distress  Abdomen: gravid, soft, non-tender  Pelvic: Cervical exam performed  Dilation: 1.5 Effacement (%): Thick Station: -2  Fetal Status: Fetal Heart Rate (bpm): 142 Fundal Height: 38 cm Movement: Present Presentation: Vertex  Fetal Surveillance Testing today:    No results found for this or any previous visit (from the past 24 hour(s)).  Assessment & Plan:  1) High-risk pregnancy G2P1001 at 369w4dith an Estimated Date of Delivery: 02/09/19   2) Gestational Hypertension, stable, pt states has been running elevated at home as well, check  labs today and begin twice weekly surveillance next  3) HCV, stable  Meds: No orders of the defined types were placed in this encounter.   Labs/procedures today: see below  Treatment Plan:  Twice weekly surveillance for GHTN  Reviewed: Term labor symptoms and general obstetric precautions including but not limited to vaginal bleeding, contractions, leaking of fluid and fetal movement were reviewed in detail with the patient.  All questions were answered.  Follow-up: Return in about 4 days (around 01/13/2019) for BPP/sono, HROB.  Orders Placed This Encounter  Procedures  . GC/Chlamydia Probe Amp  . Culture, beta strep (group b only)  . [place] BPP W/NONSTRESS  . [place] Cord Doppler  . HCV RNA quant  . CBC  . Comp Met (CMET)   LuFlorian Buff6/18/2020 12:33 PM

## 2019-01-09 NOTE — Addendum Note (Signed)
Addended by: Christiana Pellant A on: 01/09/2019 12:34 PM   Modules accepted: Orders

## 2019-01-10 LAB — COMPREHENSIVE METABOLIC PANEL

## 2019-01-10 LAB — PROTEIN / CREATININE RATIO, URINE
Creatinine, Urine: 88 mg/dL
Protein, Ur: 14.8 mg/dL
Protein/Creat Ratio: 168 mg/g creat (ref 0–200)

## 2019-01-11 LAB — HCV RNA QUANT
HCV log10: 5.834 log10 IU/mL
Hepatitis C Quantitation: 683000 IU/mL

## 2019-01-13 ENCOUNTER — Ambulatory Visit (INDEPENDENT_AMBULATORY_CARE_PROVIDER_SITE_OTHER): Payer: Medicare Other

## 2019-01-13 ENCOUNTER — Other Ambulatory Visit (HOSPITAL_COMMUNITY): Payer: Self-pay

## 2019-01-13 ENCOUNTER — Telehealth (HOSPITAL_COMMUNITY): Payer: Self-pay

## 2019-01-13 ENCOUNTER — Encounter: Payer: Self-pay | Admitting: Women's Health

## 2019-01-13 ENCOUNTER — Ambulatory Visit (INDEPENDENT_AMBULATORY_CARE_PROVIDER_SITE_OTHER): Payer: Medicare Other | Admitting: Women's Health

## 2019-01-13 ENCOUNTER — Other Ambulatory Visit: Payer: Self-pay | Admitting: Obstetrics & Gynecology

## 2019-01-13 ENCOUNTER — Other Ambulatory Visit: Payer: Self-pay

## 2019-01-13 VITALS — BP 142/90 | HR 102 | Wt 221.2 lb

## 2019-01-13 DIAGNOSIS — O133 Gestational [pregnancy-induced] hypertension without significant proteinuria, third trimester: Secondary | ICD-10-CM | POA: Diagnosis not present

## 2019-01-13 DIAGNOSIS — B009 Herpesviral infection, unspecified: Secondary | ICD-10-CM

## 2019-01-13 DIAGNOSIS — Z1389 Encounter for screening for other disorder: Secondary | ICD-10-CM

## 2019-01-13 DIAGNOSIS — O0993 Supervision of high risk pregnancy, unspecified, third trimester: Secondary | ICD-10-CM | POA: Diagnosis not present

## 2019-01-13 DIAGNOSIS — B182 Chronic viral hepatitis C: Secondary | ICD-10-CM

## 2019-01-13 DIAGNOSIS — Z3403 Encounter for supervision of normal first pregnancy, third trimester: Secondary | ICD-10-CM

## 2019-01-13 DIAGNOSIS — O98513 Other viral diseases complicating pregnancy, third trimester: Secondary | ICD-10-CM

## 2019-01-13 DIAGNOSIS — Z3A36 36 weeks gestation of pregnancy: Secondary | ICD-10-CM

## 2019-01-13 DIAGNOSIS — Z331 Pregnant state, incidental: Secondary | ICD-10-CM

## 2019-01-13 DIAGNOSIS — O139 Gestational [pregnancy-induced] hypertension without significant proteinuria, unspecified trimester: Secondary | ICD-10-CM | POA: Insufficient documentation

## 2019-01-13 LAB — POCT URINALYSIS DIPSTICK OB
Blood, UA: NEGATIVE
Glucose, UA: NEGATIVE
Ketones, UA: NEGATIVE
Leukocytes, UA: NEGATIVE
Nitrite, UA: NEGATIVE

## 2019-01-13 LAB — GC/CHLAMYDIA PROBE AMP
Chlamydia trachomatis, NAA: NEGATIVE
Neisseria Gonorrhoeae by PCR: NEGATIVE

## 2019-01-13 LAB — CULTURE, BETA STREP (GROUP B ONLY): Strep Gp B Culture: NEGATIVE

## 2019-01-13 MED ORDER — ACYCLOVIR 400 MG PO TABS: 400.0000 mg | ORAL_TABLET | Freq: Three times a day (TID) | ORAL | 3 refills | Status: DC

## 2019-01-13 NOTE — Progress Notes (Signed)
HIGH-RISK PREGNANCY VISIT Patient name: [name] MRN [id number]  Date of birth: Jul 14, 1991 Chief Complaint:   Routine Prenatal Visit (U/S)  History of Present Illness:   [name] is a 29 y.o. G57P1001 female at [redacted]w[redacted]d with an Estimated Date of Delivery: 02/09/19 being seen today for ongoing management of a high-risk pregnancy complicated by [facility] dx last week, +HCV, Prev c/s, H/O PPH, H/O Pre-e, Zubsolv.  Today she reports headache- mild today, no ruq/epigastric pain, some mild nausea, no visual changes- sees gold spots only w/ position changes/getting up too quickly. Home bp's 129-146/89-96.  Contractions: Not present.  .  Movement: Present. denies leaking of fluid.  Review of Systems:   Pertinent items are noted in HPI Denies abnormal vaginal discharge w/ itching/odor/irritation, headaches, visual changes, shortness of breath, chest pain, abdominal pain, severe nausea/vomiting, or problems with urination or bowel movements unless otherwise stated above. Pertinent History Reviewed:  Reviewed past medical,surgical, social, obstetrical and family history.  Reviewed problem list, medications and allergies. Physical Assessment:   Vitals:   01/13/19 1221  BP: (!) 142/90  Pulse: (!) 102  Weight: 221 lb 3.2 oz (100.3 kg)  Body mass index is 44.3 kg/m.           Physical Examination:   General appearance: alert, well appearing, and in no distress  Mental status: alert, oriented to person, place, and time  Skin: warm & dry   Extremities: Edema: None    Cardiovascular: normal heart rate noted  Respiratory: normal respiratory effort, no distress  Abdomen: gravid, soft, non-tender  Pelvic: Cervical exam deferred         Fetal Status:     Movement: Present    Fetal Surveillance Testing today: [place] 76+1 wks,cephalic,posterior placenta gr 0,efw 2984 g 65%,fhr 164 bpm,normal ovaries bilat,afi 14 cm,RI .66,.59,.58=66%,BPP 8/8  Results for orders placed or performed in visit on 01/13/19  (from the past 24 hour(s))  POC Urinalysis Dipstick OB   Collection Time: 01/13/19 12:26 PM  Result Value Ref Range   Color, UA     Clarity, UA     Glucose, UA Negative Negative   Bilirubin, UA     Ketones, UA neg    Spec Grav, UA     Blood, UA neg    pH, UA     POC,PROTEIN,UA Trace Negative, Trace, Small (1+), Moderate (2+), Large (3+), 4+   Urobilinogen, UA     Nitrite, UA neg    Leukocytes, UA Negative Negative   Appearance     Odor      Assessment & Plan:  1) High-risk pregnancy G2P1001 at [redacted]w[redacted]d with an Estimated Date of Delivery: 02/09/19   2) GHTN w/ h/o pre-e, mild headache, otherwise asymptomatic, repeat labs today, reviewed pre-e s/s, reasons to seek care  3) +HCV, stable  4) Prev c/s>for failed IOL @ 41wks w/ LGA baby, wants TOLAC  5) H/O PPH  6) Zubsolv tx> 2.9mg  TID  7) 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 acyclovir (ZOVIRAX) 400 MG tablet    Sig: Take 1 tablet (400 mg total) by mouth 3 (three) times daily.    Dispense:  90 tablet    Refill:  3    Order Specific Question:   Supervising Provider    Answer:   Florian Buff [[id number]]    Labs/procedures today: u/s  Treatment Plan:  nst Thurs, IOL 6/28 @[place] ,  IOL  form faxed via Epic and orders placed   Reviewed: Preterm labor symptoms and general obstetric precautions including but not limited to vaginal bleeding, contractions, leaking of fluid and fetal movement were reviewed in detail with the patient.  All questions were answered.  Follow-up: Return for thurs for hrob/nst.  Orders Placed This Encounter  Procedures  . CBC  . Comprehensive metabolic panel  . Protein / creatinine ratio, urine  . POC Urinalysis Dipstick OB   Cheral MarkerKimberly R Mare Ludtke CNM, [facility]NP-BC 01/13/2019 1:06 PM

## 2019-01-13 NOTE — Progress Notes (Signed)
[place] 18+9 wks,cephalic,posterior placenta gr 0,efw 2984 g 65%,fhr 164 bpm,normal ovaries bilat,afi 14 cm,RI .66,.59,.58=66%,BPP 8/8

## 2019-01-13 NOTE — Treatment Plan (Signed)
Induction Assessment Scheduling Form Fax to [facility]:  [phone]  [name]                                                                                   DOB:  19-May-1991                                                            MRN:  [id number]                                                                     Phone #: [phone]                    Provider:  [facility]  GP:  [id number]                                                            Estimated Date of Delivery: 02/09/19  Dating Criteria: 7wk u/s    Medical Indications for induction:  GHTN Admission Date/Time:  6/28 @[place]  Gestational age on admission:  37.0   Filed Weights   01/13/19 1221  Weight: 221 lb 3.2 oz (100.3 kg)   HIV:  Non Reactive (04/29 0833) GBS:  neg  1.5/th/-2 last week   Method of induction(proposed):  Foley bulb, no cytotec (TOLAC)   Scheduling Provider Signature:  Roma Schanz, CNM                                            Today's Date:  01/13/2019

## 2019-01-13 NOTE — Patient Instructions (Signed)
[name], I greatly value your feedback.  If you receive a survey following your visit with [place] today, we appreciate you taking the time to fill it out.  Thanks, [name], CNM, [facility]  [name]!!! It is now [facility] ([place], [place] [address]) Entrance located off of [place] parking    Call the office [phone]) or go to [facility] if:  You begin to have strong, frequent contractions  Your water breaks.  Sometimes it is a big gush of fluid, sometimes it is just a trickle that keeps getting your panties wet or running down your legs  You have vaginal bleeding.  It is normal to have a small amount of spotting if your cervix was checked.   You don't feel your baby moving like normal.  If you don't, get you something to eat and drink and lay down and focus on feeling your baby move.  You should feel at least 10 movements in 2 hours.  If you don't, you should call the office or go to [facility].   Call the office [phone]) or go to [facility] hospital for these signs of pre-eclampsia:  Severe headache that does not go away with Tylenol  Visual changes- seeing spots, double, blurred vision  Pain under your right breast or upper abdomen that does not go away with Tums or heartburn medicine  Nausea and/or vomiting  Severe swelling in your hands, feet, and face    Preeclampsia and Eclampsia  Preeclampsia is a serious condition that may develop during pregnancy. It is also called toxemia of pregnancy. This condition causes high blood pressure along with other symptoms, such as swelling and headaches. These symptoms may develop as the condition gets worse. Preeclampsia may occur at 20 weeks of pregnancy or later. Diagnosing and treating preeclampsia early is very important. If not treated early, it can cause serious problems for you and your baby. One problem it can lead to is eclampsia.  Eclampsia is a condition that causes muscle jerking or shaking (convulsions or seizures) and other serious problems for the mother. During pregnancy, delivering your baby may be the best treatment for preeclampsia or eclampsia. For most women, preeclampsia and eclampsia symptoms go away after giving birth. In rare cases, a woman may develop preeclampsia after giving birth (postpartum preeclampsia). This usually occurs within 48 hours after childbirth but may occur up to 6 weeks after giving birth. What are the causes? The cause of preeclampsia is not known. What increases the risk? The following risk factors make you more likely to develop preeclampsia:  Being pregnant for the first time.  Having had preeclampsia during a past pregnancy.  Having a family history of preeclampsia.  Having high blood pressure.  Being pregnant with more than one baby.  Being 81 or older.  Being African-American.  Having kidney disease or diabetes.  Having medical conditions such as lupus or blood diseases.  Being very overweight (obese). What are the signs or symptoms? The earliest signs of preeclampsia are:  High blood pressure.  Increased protein in your urine. Your health care provider will check for this at every visit before you give birth (prenatal visit). Other symptoms that may develop as the condition gets worse include:  Severe headaches.  Sudden weight gain.  Swelling of the hands, face, legs, and feet.  Nausea and vomiting.  Vision problems, such as blurred or double vision.  Numbness  in the face, arms, legs, and feet.  Urinating less than usual.  Dizziness.  Slurred speech.  Abdominal pain, especially upper abdominal pain.  Convulsions or seizures. How is this diagnosed? There are no screening tests for preeclampsia. Your health care provider will ask you about symptoms and check for signs of preeclampsia during your prenatal visits. You may also have tests that  include:  Urine tests.  Blood tests.  Checking your blood pressure.  Monitoring your baby's heart rate.  Ultrasound. How is this treated? You and your health care provider will determine the treatment approach that is best for you. Treatment may include:  Having more frequent prenatal exams to check for signs of preeclampsia, if you have an increased risk for preeclampsia.  Medicine to lower your blood pressure.  Staying in the hospital, if your condition is severe. There, treatment will focus on controlling your blood pressure and the amount of fluids in your body (fluid retention).  Taking medicine (magnesium sulfate) to prevent seizures. This may be given as an injection or through an IV.  Taking a low-dose aspirin during your pregnancy.  Delivering your baby early, if your condition gets worse. You may have your labor started with medicine (induced), or you may have a cesarean delivery. Follow these instructions at home: Eating and drinking   Drink enough fluid to keep your urine pale yellow.  Avoid caffeine. Lifestyle  Do not use any products that contain nicotine or tobacco, such as cigarettes and e-cigarettes. If you need help quitting, ask your health care provider.  Do not use alcohol or drugs.  Avoid stress as much as possible. Rest and get plenty of sleep. General instructions  Take over-the-counter and prescription medicines only as told by your health care provider.  When lying down, lie on your left side. This keeps pressure off your major blood vessels.  When sitting or lying down, raise (elevate) your feet. Try putting some pillows underneath your lower legs.  Exercise regularly. Ask your health care provider what kinds of exercise are best for you.  Keep all follow-up and prenatal visits as told by your health care provider. This is important. How is this prevented? There is no known way of preventing preeclampsia or eclampsia from developing.  However, to lower your risk of complications and detect problems early:  Get regular prenatal care. Your health care provider may be able to diagnose and treat the condition early.  Maintain a healthy weight. Ask your health care provider for help managing weight gain during pregnancy.  Work with your health care provider to manage any long-term (chronic) health conditions you have, such as diabetes or kidney problems.  You may have tests of your blood pressure and kidney function after giving birth.  Your health care provider may have you take low-dose aspirin during your next pregnancy. Contact a health care provider if:  You have symptoms that your health care provider told you may require more treatment or monitoring, such as: ? Headaches. ? Nausea or vomiting. ? Abdominal pain. ? Dizziness. ? Light-headedness. Get help right away if:  You have severe: ? Abdominal pain. ? Headaches that do not get better. ? Dizziness. ? Vision problems. ? Confusion. ? Nausea or vomiting.  You have any of the following: ? A seizure. ? Sudden, rapid weight gain. ? Sudden swelling in your hands, ankles, or face. ? Trouble moving any part of your body. ? Numbness in any part of your body. ? Trouble speaking. ? Abnormal bleeding.  You faint. Summary  Preeclampsia is a serious condition that may develop during pregnancy. It is also called toxemia of pregnancy.  This condition causes high blood pressure along with other symptoms, such as swelling and headaches.  Diagnosing and treating preeclampsia early is very important. If not treated early, it can cause serious problems for you and your baby.  Get help right away if you have symptoms that your health care provider told you to watch for. This information is not intended to replace advice given to you by your health care provider. Make sure you discuss any questions you have with your health care provider. Document Released: 07/07/2000  Document Revised: 06/26/2017 Document Reviewed: 02/14/2016 Elsevier Interactive Patient Education  2019 [employer]

## 2019-01-14 ENCOUNTER — Other Ambulatory Visit: Payer: Self-pay | Admitting: Advanced Practice Midwife

## 2019-01-14 ENCOUNTER — Encounter (HOSPITAL_COMMUNITY): Payer: Self-pay

## 2019-01-14 LAB — COMPREHENSIVE METABOLIC PANEL
ALT: 7 IU/L (ref 0–32)
AST: 20 IU/L (ref 0–40)
Albumin/Globulin Ratio: 1.5 (ref 1.2–2.2)
Albumin: 3.8 g/dL — ABNORMAL LOW (ref 3.9–5.0)
Alkaline Phosphatase: 151 IU/L — ABNORMAL HIGH (ref 39–117)
BUN/Creatinine Ratio: 13 (ref 9–23)
BUN: 5 mg/dL — ABNORMAL LOW (ref 6–20)
Bilirubin Total: <0.2 mg/dL (ref 0.0–1.2)
CO2: 19 mmol/L — ABNORMAL LOW (ref 20–29)
Calcium: 9.2 mg/dL (ref 8.7–10.2)
Chloride: 104 mmol/L (ref 96–106)
Creatinine, Ser: 0.39 mg/dL — ABNORMAL LOW (ref 0.57–1.00)
GFR calc Af Amer: 164 mL/min/{1.73_m2} (ref >59)
GFR calc non Af Amer: 142 mL/min/{1.73_m2} (ref >59)
Globulin, Total: 2.5 g/dL (ref 1.5–4.5)
Glucose: 76 mg/dL (ref 65–99)
Potassium: 4 mmol/L (ref 3.5–5.2)
Sodium: 137 mmol/L (ref 134–144)
Total Protein: 6.3 g/dL (ref 6.0–8.5)

## 2019-01-14 LAB — CBC
Hematocrit: 34.4 % (ref 34.0–46.6)
Hematocrit: 37.3 % (ref 34.0–46.6)
Hemoglobin: 11.5 g/dL (ref 11.1–15.9)
Hemoglobin: 12.2 g/dL (ref 11.1–15.9)
MCH: 27.4 pg (ref 26.6–33.0)
MCH: 27.7 pg (ref 26.6–33.0)
MCHC: 33.4 g/dL (ref 31.5–35.7)
MCHC: 32.7 g/dL (ref 31.5–35.7)
MCV: 82 fL (ref 79–97)
MCV: 85 fL (ref 79–97)
Platelets: 144 10*3/uL — ABNORMAL LOW (ref 150–450)
Platelets: 168 10*3/uL (ref 150–450)
RBC: 4.2 x10E6/uL (ref 3.77–5.28)
RBC: 4.41 x10E6/uL (ref 3.77–5.28)
RDW: 14.1 % (ref 11.7–15.4)
RDW: 14.3 % (ref 11.7–15.4)
WBC: 17.4 10*3/uL — ABNORMAL HIGH (ref 3.4–10.8)
WBC: 18.3 10*3/uL — ABNORMAL HIGH (ref 3.4–10.8)

## 2019-01-14 LAB — PROTEIN / CREATININE RATIO, URINE
Creatinine, Urine: 103.4 mg/dL
Protein, Ur: 17.9 mg/dL
Protein/Creat Ratio: 173 mg/g creat (ref 0–200)

## 2019-01-14 NOTE — Telephone Encounter (Signed)
Preadmission screen  

## 2019-01-16 ENCOUNTER — Other Ambulatory Visit (HOSPITAL_COMMUNITY)
Admission: RE | Admit: 2019-01-16 | Discharge: 2019-01-16 | Disposition: A | Discharge status: Home / Self Care | Payer: Medicare Other | Source: Ambulatory Visit | Attending: Obstetrics and Gynecology | Admitting: Obstetrics and Gynecology

## 2019-01-16 ENCOUNTER — Ambulatory Visit (INDEPENDENT_AMBULATORY_CARE_PROVIDER_SITE_OTHER): Payer: Medicare Other | Admitting: Women's Health

## 2019-01-16 ENCOUNTER — Encounter: Payer: Self-pay | Admitting: Women's Health

## 2019-01-16 ENCOUNTER — Other Ambulatory Visit: Payer: Self-pay

## 2019-01-16 VITALS — BP 140/90 | HR 109 | Wt 223.3 lb

## 2019-01-16 DIAGNOSIS — Z3A36 36 weeks gestation of pregnancy: Secondary | ICD-10-CM

## 2019-01-16 DIAGNOSIS — Z1389 Encounter for screening for other disorder: Secondary | ICD-10-CM

## 2019-01-16 DIAGNOSIS — Z331 Pregnant state, incidental: Secondary | ICD-10-CM

## 2019-01-16 DIAGNOSIS — O99323 Drug use complicating pregnancy, third trimester: Secondary | ICD-10-CM | POA: Diagnosis not present

## 2019-01-16 DIAGNOSIS — O133 Gestational [pregnancy-induced] hypertension without significant proteinuria, third trimester: Secondary | ICD-10-CM | POA: Diagnosis not present

## 2019-01-16 DIAGNOSIS — O0993 Supervision of high risk pregnancy, unspecified, third trimester: Secondary | ICD-10-CM

## 2019-01-16 DIAGNOSIS — Z3483 Encounter for supervision of other normal pregnancy, third trimester: Secondary | ICD-10-CM

## 2019-01-16 DIAGNOSIS — F1120 Opioid dependence, uncomplicated: Secondary | ICD-10-CM

## 2019-01-16 DIAGNOSIS — Z1159 Encounter for screening for other viral diseases: Secondary | ICD-10-CM | POA: Insufficient documentation

## 2019-01-16 LAB — POCT URINALYSIS DIPSTICK OB
Blood, UA: NEGATIVE
Glucose, UA: NEGATIVE
Ketones, UA: NEGATIVE
Leukocytes, UA: NEGATIVE
Nitrite, UA: NEGATIVE

## 2019-01-16 LAB — SARS CORONAVIRUS 2 (TAT 6-24 HRS): SARS Coronavirus 2: NEGATIVE

## 2019-01-16 MED ORDER — CLINDAMYCIN HCL 300 MG PO CAPS: 300.0000 mg | ORAL_CAPSULE | Freq: Three times a day (TID) | ORAL | 0 refills | Status: DC

## 2019-01-16 NOTE — MAU Note (Signed)
Swab collected without difficulty. 

## 2019-01-16 NOTE — Progress Notes (Signed)
HIGH-RISK PREGNANCY VISIT Patient name: Sydney BoopSarah A Vanosdol MRN [id number]  Date of birth: 05-05-1991 Chief Complaint:   Routine Prenatal Visit (GBS-GC-CHL)  History of Present Illness:   [name] is a 28 y.o. 392P1001 female at 187w4d with an Estimated Date of Delivery: 02/09/19 being seen today for ongoing management of a high-risk pregnancy [facility]  Today she reports tooth abscess, can't get in w/ dentist, had 4 clindamycin left she has already taken- needs refill. Denies ha, visual changes, ruq/epigastric pain, n/v.   Contractions: Not present.  .  Movement: Present. denies leaking of fluid.  Review of Systems:   Pertinent items are noted in HPI Denies abnormal vaginal discharge w/ itching/odor/irritation, headaches, visual changes, shortness of breath, chest pain, abdominal pain, severe nausea/vomiting, or problems with urination or bowel movements unless otherwise stated above. Pertinent History Reviewed:  Reviewed past medical,surgical, social, obstetrical and family history.  Reviewed problem list, medications and allergies. Physical Assessment:   Vitals:   01/16/19 1042  BP: 140/90  Pulse: (!) 109  Weight: 223 lb 4.8 oz (101.3 kg)  Body mass index is 44.72 kg/m.           Physical Examination:   General appearance: alert, well appearing, and in no distress  Mental status: alert, oriented to person, place, and time  Skin: warm & dry   Extremities: Edema: None    Cardiovascular: normal heart rate noted  Respiratory: normal respiratory effort, no distress  Abdomen: gravid, soft, non-tender  Pelvic: Cervical exam deferred         Fetal Status: Fetal Heart Rate (bpm): 135   Movement: Present Presentation: Vertex  Fetal Surveillance Testing today: NST: FHR baseline 135 bpm, Variability: moderate, Accelerations:present, Decelerations:  Absent= Cat 1/Reactive Toco: none     Results for orders placed or performed in visit on 01/16/19 (from the past 24 hour(s))  POC  Urinalysis Dipstick OB   Collection Time: 01/16/19 10:46 AM  Result Value Ref Range   Color, UA     Clarity, UA     Glucose, UA Negative Negative   Bilirubin, UA     Ketones, UA neg    Spec Grav, UA     Blood, UA neg    pH, UA     POC,PROTEIN,UA Trace Negative, Trace, Small (1+), Moderate (2+), Large (3+), 4+   Urobilinogen, UA     Nitrite, UA neg    Leukocytes, UA Negative Negative   Appearance     Odor      Assessment & Plan:  1) High-risk pregnancy G2P1001 at 8087w4d with an Estimated Date of Delivery: 02/09/19   2) GHTN w/ h/o pre-e, stable, labs normal 6/22, asymptomatic, has IOL scheduled for 6/28. 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 clindamycin (CLEOCIN) 300 MG capsule    Sig: Take 1 capsule (300 mg total) by mouth 3 (three) times daily.    Dispense:  21 capsule    Refill:  0    Order Specific Question:   Supervising Provider    Answer:   Lazaro ArmsEURE, LUTHER H [[id number]]    Labs/procedures today: nst  Treatment Plan:  IOL Sun 6/28 as scheduled  Reviewed: Term labor symptoms and general obstetric precautions including but not limited to vaginal bleeding, contractions, leaking of fluid and fetal movement were reviewed in detail with the patient.  All questions were answered.   Follow-up: No follow-ups on file.  Orders Placed This Encounter  Procedures  . POC Urinalysis Dipstick OB   Roma Schanz CNM, [facility] 01/16/2019 11:39 AM

## 2019-01-16 NOTE — Patient Instructions (Signed)
[name], I greatly value your feedback.  If you receive a survey following your visit with us today, we appreciate you taking the time to fill it out.  Thanks, Kim Abegail Kloeppel, CNM, WHNP-BC  [facility] It is now [facility] ([address] [place], [place] [address]) Entrance located off of [address] Free 24/7 valet parking    Call the office ([phone]) or go to [facility] if:  You begin to have strong, frequent contractions  Your water breaks.  Sometimes it is a big gush of fluid, sometimes it is just a trickle that keeps getting your panties wet or running down your legs  You have vaginal bleeding.  It is normal to have a small amount of spotting if your cervix was checked.   You don't feel your baby moving like normal.  If you don't, get you something to eat and drink and lay down and focus on feeling your baby move.  You should feel at least 10 movements in 2 hours.  If you don't, you should call the office or go to [facility].   Call the office ([phone]) or [facility] for these signs of pre-eclampsia:  Severe headache that does not go away with Tylenol  Visual changes- seeing spots, double, blurred vision  Pain under your right breast or upper abdomen that does not go away with Tums or heartburn medicine  Nausea and/or vomiting  Severe swelling in your hands, feet, and face    Preeclampsia and Eclampsia  Preeclampsia is a serious condition that may develop during pregnancy. It is also called toxemia of pregnancy. This condition causes high blood pressure along with other symptoms, such as swelling and headaches. These symptoms may develop as the condition gets worse. Preeclampsia may occur at 20 weeks of pregnancy or later. Diagnosing and treating preeclampsia early is very important. If not treated early, it can cause serious problems for you and your baby. One problem it can lead to is eclampsia.  Eclampsia is a condition that causes muscle jerking or shaking (convulsions or seizures) and other serious problems for the mother. During pregnancy, delivering your baby may be the best treatment for preeclampsia or eclampsia. For most women, preeclampsia and eclampsia symptoms go away after giving birth. In rare cases, a woman may develop preeclampsia after giving birth (postpartum preeclampsia). This usually occurs within 48 hours after childbirth but may occur up to 6 weeks after giving birth. What are the causes? The cause of preeclampsia is not known. What increases the risk? The following risk factors make you more likely to develop preeclampsia:  Being pregnant for the first time.  Having had preeclampsia during a past pregnancy.  Having a family history of preeclampsia.  Having high blood pressure.  Being pregnant with more than one baby.  Being 35 or older.  Being African-American.  Having kidney disease or diabetes.  Having medical conditions such as lupus or blood diseases.  Being very overweight (obese). What are the signs or symptoms? The earliest signs of preeclampsia are:  High blood pressure.  Increased protein in your urine. Your health care provider will check for this at every visit before you give birth (prenatal visit). Other symptoms that may develop as the condition gets worse include:  Severe headaches.  Sudden weight gain.  Swelling of the hands, face, legs, and feet.  Nausea and vomiting.  Vision problems, such as blurred or double vision.  Numbness   Numbness in the face, arms, legs, and feet.  Urinating less than usual.  Dizziness.  Slurred speech.  Abdominal pain, especially upper abdominal pain.  Convulsions or seizures. How is this diagnosed? There are no screening tests for preeclampsia. Your health care provider will ask you about symptoms and check for signs of preeclampsia during your prenatal visits. You may also have tests that  include:  Urine tests.  Blood tests.  Checking your blood pressure.  Monitoring your baby's heart rate.  Ultrasound. How is this treated? You and your health care provider will determine the treatment approach that is best for you. Treatment may include:  Having more frequent prenatal exams to check for signs of preeclampsia, if you have an increased risk for preeclampsia.  Medicine to lower your blood pressure.  Staying in the hospital, if your condition is severe. There, treatment will focus on controlling your blood pressure and the amount of fluids in your body (fluid retention).  Taking medicine (magnesium sulfate) to prevent seizures. This may be given as an injection or through an IV.  Taking a low-dose aspirin during your pregnancy.  Delivering your baby early, if your condition gets worse. You may have your labor started with medicine (induced), or you may have a cesarean delivery. Follow these instructions at home: Eating and drinking   Drink enough fluid to keep your urine pale yellow.  Avoid caffeine. Lifestyle  Do not use any products that contain nicotine or tobacco, such as cigarettes and e-cigarettes. If you need help quitting, ask your health care provider.  Do not use alcohol or drugs.  Avoid stress as much as possible. Rest and get plenty of sleep. General instructions  Take over-the-counter and prescription medicines only as told by your health care provider.  When lying down, lie on your left side. This keeps pressure off your major blood vessels.  When sitting or lying down, raise (elevate) your feet. Try putting some pillows underneath your lower legs.  Exercise regularly. Ask your health care provider what kinds of exercise are best for you.  Keep all follow-up and prenatal visits as told by your health care provider. This is important. How is this prevented? There is no known way of preventing preeclampsia or eclampsia from developing.  However, to lower your risk of complications and detect problems early:  Get regular prenatal care. Your health care provider may be able to diagnose and treat the condition early.  Maintain a healthy weight. Ask your health care provider for help managing weight gain during pregnancy.  Work with your health care provider to manage any long-term (chronic) health conditions you have, such as diabetes or kidney problems.  You may have tests of your blood pressure and kidney function after giving birth.  Your health care provider may have you take low-dose aspirin during your next pregnancy. Contact a health care provider if:  You have symptoms that your health care provider told you may require more treatment or monitoring, such as: ? Headaches. ? Nausea or vomiting. ? Abdominal pain. ? Dizziness. ? Light-headedness. Get help right away if:  You have severe: ? Abdominal pain. ? Headaches that do not get better. ? Dizziness. ? Vision problems. ? Confusion. ? Nausea or vomiting.  You have any of the following: ? A seizure. ? Sudden, rapid weight gain. ? Sudden swelling in your hands, ankles, or face. ? Trouble moving any part of your body. ? Numbness in any part of your body. ? Trouble speaking. ? Abnormal  bleeding.  You faint. Summary  Preeclampsia is a serious condition that may develop during pregnancy. It is also called toxemia of pregnancy.  This condition causes high blood pressure along with other symptoms, such as swelling and headaches.  Diagnosing and treating preeclampsia early is very important. If not treated early, it can cause serious problems for you and your baby.  Get help right away if you have symptoms that your health care provider told you to watch for. This information is not intended to replace advice given to you by your health care provider. Make sure you discuss any questions you have with your health care provider. Document Released: 07/07/2000  Document Revised: 06/26/2017 Document Reviewed: 02/14/2016 Elsevier Interactive Patient Education  2019 [employer]

## 2019-01-19 ENCOUNTER — Inpatient Hospital Stay (HOSPITAL_COMMUNITY)
Admission: RE | Admit: 2019-01-19 | Discharge: 2019-01-22 | DRG: 806 | Disposition: A | Discharge status: Home / Self Care | Payer: Medicare Other | Attending: Obstetrics & Gynecology | Admitting: Obstetrics & Gynecology

## 2019-01-19 ENCOUNTER — Inpatient Hospital Stay (HOSPITAL_COMMUNITY): Payer: Medicare Other | Admitting: Anesthesiology

## 2019-01-19 ENCOUNTER — Other Ambulatory Visit: Payer: Self-pay

## 2019-01-19 ENCOUNTER — Encounter (HOSPITAL_COMMUNITY): Payer: Self-pay | Admitting: *Deleted

## 2019-01-19 ENCOUNTER — Inpatient Hospital Stay (HOSPITAL_COMMUNITY): Payer: Medicare Other

## 2019-01-19 DIAGNOSIS — O139 Gestational [pregnancy-induced] hypertension without significant proteinuria, unspecified trimester: Secondary | ICD-10-CM | POA: Diagnosis present

## 2019-01-19 DIAGNOSIS — O9832 Other infections with a predominantly sexual mode of transmission complicating childbirth: Secondary | ICD-10-CM | POA: Diagnosis present

## 2019-01-19 DIAGNOSIS — Z30017 Encounter for initial prescription of implantable subdermal contraceptive: Secondary | ICD-10-CM | POA: Diagnosis not present

## 2019-01-19 DIAGNOSIS — O99344 Other mental disorders complicating childbirth: Secondary | ICD-10-CM | POA: Diagnosis present

## 2019-01-19 DIAGNOSIS — O99324 Drug use complicating childbirth: Secondary | ICD-10-CM | POA: Diagnosis present

## 2019-01-19 DIAGNOSIS — O133 Gestational [pregnancy-induced] hypertension without significant proteinuria, third trimester: Secondary | ICD-10-CM | POA: Diagnosis not present

## 2019-01-19 DIAGNOSIS — F1199 Opioid use, unspecified with unspecified opioid-induced disorder: Secondary | ICD-10-CM | POA: Diagnosis present

## 2019-01-19 DIAGNOSIS — Z98891 History of uterine scar from previous surgery: Secondary | ICD-10-CM

## 2019-01-19 DIAGNOSIS — O09299 Supervision of pregnancy with other poor reproductive or obstetric history, unspecified trimester: Secondary | ICD-10-CM

## 2019-01-19 DIAGNOSIS — F17210 Nicotine dependence, cigarettes, uncomplicated: Secondary | ICD-10-CM | POA: Diagnosis present

## 2019-01-19 DIAGNOSIS — O134 Gestational [pregnancy-induced] hypertension without significant proteinuria, complicating childbirth: Secondary | ICD-10-CM | POA: Diagnosis not present

## 2019-01-19 DIAGNOSIS — Z23 Encounter for immunization: Secondary | ICD-10-CM | POA: Diagnosis not present

## 2019-01-19 DIAGNOSIS — O34219 Maternal care for unspecified type scar from previous cesarean delivery: Secondary | ICD-10-CM | POA: Diagnosis present

## 2019-01-19 DIAGNOSIS — F1120 Opioid dependence, uncomplicated: Secondary | ICD-10-CM

## 2019-01-19 DIAGNOSIS — O99320 Drug use complicating pregnancy, unspecified trimester: Secondary | ICD-10-CM

## 2019-01-19 DIAGNOSIS — F1190 Opioid use, unspecified, uncomplicated: Secondary | ICD-10-CM | POA: Diagnosis present

## 2019-01-19 DIAGNOSIS — O99214 Obesity complicating childbirth: Secondary | ICD-10-CM | POA: Diagnosis present

## 2019-01-19 DIAGNOSIS — A6000 Herpesviral infection of urogenital system, unspecified: Secondary | ICD-10-CM | POA: Diagnosis present

## 2019-01-19 DIAGNOSIS — Z3A37 37 weeks gestation of pregnancy: Secondary | ICD-10-CM

## 2019-01-19 DIAGNOSIS — O9842 Viral hepatitis complicating childbirth: Secondary | ICD-10-CM | POA: Diagnosis present

## 2019-01-19 DIAGNOSIS — B182 Chronic viral hepatitis C: Secondary | ICD-10-CM | POA: Diagnosis present

## 2019-01-19 DIAGNOSIS — O99334 Smoking (tobacco) complicating childbirth: Secondary | ICD-10-CM | POA: Diagnosis present

## 2019-01-19 DIAGNOSIS — O0990 Supervision of high risk pregnancy, unspecified, unspecified trimester: Secondary | ICD-10-CM

## 2019-01-19 DIAGNOSIS — F319 Bipolar disorder, unspecified: Secondary | ICD-10-CM | POA: Diagnosis present

## 2019-01-19 DIAGNOSIS — F1110 Opioid abuse, uncomplicated: Secondary | ICD-10-CM | POA: Diagnosis present

## 2019-01-19 DIAGNOSIS — F4312 Post-traumatic stress disorder, chronic: Secondary | ICD-10-CM | POA: Diagnosis present

## 2019-01-19 DIAGNOSIS — B009 Herpesviral infection, unspecified: Secondary | ICD-10-CM | POA: Diagnosis present

## 2019-01-19 HISTORY — DX: Post-traumatic stress disorder, unspecified: F43.10

## 2019-01-19 HISTORY — DX: Acquired absence of other specified parts of digestive tract: Z90.49

## 2019-01-19 HISTORY — DX: Partial loss of teeth, unspecified cause, unspecified class: K08.409

## 2019-01-19 HISTORY — DX: Nicotine dependence, unspecified, uncomplicated: F17.200

## 2019-01-19 LAB — RAPID URINE DRUG SCREEN, HOSP PERFORMED
Amphetamines: NOT DETECTED
Barbiturates: NOT DETECTED
Benzodiazepines: NOT DETECTED
Cocaine: NOT DETECTED
Opiates: NOT DETECTED
Tetrahydrocannabinol: NOT DETECTED

## 2019-01-19 LAB — CBC
HCT: 34.6 % — ABNORMAL LOW (ref 36.0–46.0)
HCT: 32.3 % — ABNORMAL LOW (ref 36.0–46.0)
Hemoglobin: 11.3 g/dL — ABNORMAL LOW (ref 12.0–15.0)
Hemoglobin: 10.9 g/dL — ABNORMAL LOW (ref 12.0–15.0)
MCH: 27.2 pg (ref 26.0–34.0)
MCH: 27.2 pg (ref 26.0–34.0)
MCHC: 32.7 g/dL (ref 30.0–36.0)
MCHC: 33.7 g/dL (ref 30.0–36.0)
MCV: 83.2 fL (ref 80.0–100.0)
MCV: 80.5 fL (ref 80.0–100.0)
Platelets: 147 10*3/uL — ABNORMAL LOW (ref 150–400)
Platelets: 151 10*3/uL (ref 150–400)
RBC: 4.16 MIL/uL (ref 3.87–5.11)
RBC: 4.01 MIL/uL (ref 3.87–5.11)
RDW: 14.2 % (ref 11.5–15.5)
RDW: 14.1 % (ref 11.5–15.5)
WBC: 18.3 10*3/uL — ABNORMAL HIGH (ref 4.0–10.5)
WBC: 15.8 10*3/uL — ABNORMAL HIGH (ref 4.0–10.5)
nRBC: 0 % (ref 0.0–0.2)
nRBC: 0 % (ref 0.0–0.2)

## 2019-01-19 LAB — PROTEIN / CREATININE RATIO, URINE
Creatinine, Urine: 113.44 mg/dL
Protein Creatinine Ratio: 0.2 mg/mg{Cre} — ABNORMAL HIGH (ref 0.00–0.15)
Total Protein, Urine: 23 mg/dL

## 2019-01-19 LAB — COMPREHENSIVE METABOLIC PANEL
ALT: 11 U/L (ref 0–44)
AST: 24 U/L (ref 15–41)
Albumin: 2.9 g/dL — ABNORMAL LOW (ref 3.5–5.0)
Alkaline Phosphatase: 135 U/L — ABNORMAL HIGH (ref 38–126)
Anion gap: 12 (ref 5–15)
BUN: 7 mg/dL (ref 6–20)
CO2: 19 mmol/L — ABNORMAL LOW (ref 22–32)
Calcium: 8.9 mg/dL (ref 8.9–10.3)
Chloride: 105 mmol/L (ref 98–111)
Creatinine, Ser: 0.43 mg/dL — ABNORMAL LOW (ref 0.44–1.00)
GFR calc Af Amer: >60 mL/min (ref >60)
GFR calc non Af Amer: >60 mL/min (ref >60)
Glucose, Bld: 75 mg/dL (ref 70–99)
Potassium: 3.5 mmol/L (ref 3.5–5.1)
Sodium: 136 mmol/L (ref 135–145)
Total Bilirubin: 0.3 mg/dL (ref 0.3–1.2)
Total Protein: 6.6 g/dL (ref 6.5–8.1)

## 2019-01-19 LAB — TYPE AND SCREEN
ABO/RH(D): O POS
Antibody Screen: NEGATIVE

## 2019-01-19 LAB — RPR: RPR Ser Ql: NONREACTIVE

## 2019-01-19 MED ORDER — OXYTOCIN BOLUS FROM INFUSION: 500.0000 mL | Freq: Once | INTRAVENOUS | Status: DC

## 2019-01-19 MED ORDER — ACETAMINOPHEN 325 MG PO TABS: 650.0000 mg | ORAL_TABLET | ORAL | Status: DC | PRN

## 2019-01-19 MED ORDER — FENTANYL-BUPIVACAINE-NACL 0.5-0.125-0.9 MG/250ML-% EP SOLN
12.0000 mL/h | EPIDURAL | Status: DC | PRN
  Administered 2019-01-20 (×2): 12 mL/h via EPIDURAL
  Filled 2019-01-19 (×3): qty 250

## 2019-01-19 MED ORDER — PHENYLEPHRINE 40 MCG/ML (10ML) SYRINGE FOR IV PUSH (FOR BLOOD PRESSURE SUPPORT): 80.0000 ug | PREFILLED_SYRINGE | INTRAVENOUS | Status: DC | PRN

## 2019-01-19 MED ORDER — LACTATED RINGERS IV SOLN
INTRAVENOUS | Status: DC
  Administered 2019-01-19 – 2019-01-20 (×5): via INTRAVENOUS

## 2019-01-19 MED ORDER — OXYTOCIN 40 UNITS IN NORMAL SALINE INFUSION - SIMPLE MED
2.5000 [IU]/h | INTRAVENOUS | Status: DC
  Filled 2019-01-19: qty 1000

## 2019-01-19 MED ORDER — LABETALOL HCL 5 MG/ML IV SOLN: 80.0000 mg | INTRAVENOUS | Status: DC | PRN

## 2019-01-19 MED ORDER — NON FORMULARY: 8.0000 mg | Freq: Two times a day (BID) | Status: DC

## 2019-01-19 MED ORDER — OXYTOCIN 40 UNITS IN NORMAL SALINE INFUSION - SIMPLE MED
1.0000 m[IU]/min | INTRAVENOUS | Status: DC
  Administered 2019-01-20: 34 m[IU]/min via INTRAVENOUS
  Filled 2019-01-19: qty 1000

## 2019-01-19 MED ORDER — BUPRENORPHINE HCL-NALOXONE HCL 2-0.5 MG SL SUBL: 2.0000 | SUBLINGUAL_TABLET | Freq: Two times a day (BID) | SUBLINGUAL | Status: DC

## 2019-01-19 MED ORDER — FENTANYL CITRATE (PF) 100 MCG/2ML IJ SOLN: 50.0000 ug | INTRAMUSCULAR | Status: DC | PRN

## 2019-01-19 MED ORDER — LABETALOL HCL 5 MG/ML IV SOLN: 20.0000 mg | INTRAVENOUS | Status: DC | PRN

## 2019-01-19 MED ORDER — SODIUM CHLORIDE (PF) 0.9 % IJ SOLN
INTRAMUSCULAR | Status: DC | PRN
  Administered 2019-01-19: 14 mL/h via EPIDURAL

## 2019-01-19 MED ORDER — HYDRALAZINE HCL 20 MG/ML IJ SOLN: 10.0000 mg | INTRAMUSCULAR | Status: DC | PRN

## 2019-01-19 MED ORDER — ONDANSETRON HCL 4 MG/2ML IJ SOLN
4.0000 mg | Freq: Four times a day (QID) | INTRAMUSCULAR | Status: DC | PRN
  Administered 2019-01-20: 4 mg via INTRAVENOUS
  Filled 2019-01-19: qty 2

## 2019-01-19 MED ORDER — SOD CITRATE-CITRIC ACID 500-334 MG/5ML PO SOLN: 30.0000 mL | ORAL | Status: DC | PRN

## 2019-01-19 MED ORDER — DIPHENHYDRAMINE HCL 50 MG/ML IJ SOLN: 12.5000 mg | INTRAMUSCULAR | Status: DC | PRN

## 2019-01-19 MED ORDER — TRANEXAMIC ACID-NACL 1000-0.7 MG/100ML-% IV SOLN: 1000.0000 mg | INTRAVENOUS | Status: DC

## 2019-01-19 MED ORDER — LABETALOL HCL 5 MG/ML IV SOLN: 40.0000 mg | INTRAVENOUS | Status: DC | PRN

## 2019-01-19 MED ORDER — OXYCODONE-ACETAMINOPHEN 5-325 MG PO TABS: 1.0000 | ORAL_TABLET | ORAL | Status: DC | PRN

## 2019-01-19 MED ORDER — EPHEDRINE 5 MG/ML INJ: 10.0000 mg | INTRAVENOUS | Status: DC | PRN

## 2019-01-19 MED ORDER — OXYTOCIN 40 UNITS IN NORMAL SALINE INFUSION - SIMPLE MED
1.0000 m[IU]/min | INTRAVENOUS | Status: DC
  Administered 2019-01-19: 02:00:00 2 m[IU]/min via INTRAVENOUS

## 2019-01-19 MED ORDER — TRANEXAMIC ACID-NACL 1000-0.7 MG/100ML-% IV SOLN
1000.0000 mg | Freq: Once | INTRAVENOUS | Status: AC
  Administered 2019-01-20: 22:00:00 1000 mg via INTRAVENOUS
  Filled 2019-01-19: qty 100

## 2019-01-19 MED ORDER — BUPRENORPHINE HCL 8 MG SL SUBL
8.0000 mg | SUBLINGUAL_TABLET | Freq: Two times a day (BID) | SUBLINGUAL | Status: DC
  Administered 2019-01-19: 8 mg via SUBLINGUAL
  Filled 2019-01-19 (×3): qty 1

## 2019-01-19 MED ORDER — LIDOCAINE HCL (PF) 1 % IJ SOLN: 30.0000 mL | INTRAMUSCULAR | Status: DC | PRN

## 2019-01-19 MED ORDER — PHENYLEPHRINE 40 MCG/ML (10ML) SYRINGE FOR IV PUSH (FOR BLOOD PRESSURE SUPPORT)
80.0000 ug | PREFILLED_SYRINGE | INTRAVENOUS | Status: DC | PRN
  Filled 2019-01-19: qty 10

## 2019-01-19 MED ORDER — FENTANYL CITRATE (PF) 100 MCG/2ML IJ SOLN: 100.0000 ug | INTRAMUSCULAR | Status: DC | PRN

## 2019-01-19 MED ORDER — LACTATED RINGERS IV SOLN
500.0000 mL | Freq: Once | INTRAVENOUS | Status: AC
  Administered 2019-01-19: 07:00:00 500 mL via INTRAVENOUS

## 2019-01-19 MED ORDER — BUPRENORPHINE HCL-NALOXONE HCL 2-0.5 MG SL SUBL
3.0000 | SUBLINGUAL_TABLET | Freq: Once | SUBLINGUAL | Status: AC
  Administered 2019-01-19: 3 via SUBLINGUAL
  Filled 2019-01-19: qty 3

## 2019-01-19 MED ORDER — BUPRENORPHINE HCL-NALOXONE HCL 8-2 MG SL SUBL: 1.0000 | SUBLINGUAL_TABLET | Freq: Two times a day (BID) | SUBLINGUAL | Status: DC

## 2019-01-19 MED ORDER — HYDROXYZINE HCL 50 MG PO TABS: 50.0000 mg | ORAL_TABLET | Freq: Four times a day (QID) | ORAL | Status: DC | PRN

## 2019-01-19 MED ORDER — TERBUTALINE SULFATE 1 MG/ML IJ SOLN: 0.2500 mg | Freq: Once | INTRAMUSCULAR | Status: DC | PRN

## 2019-01-19 MED ORDER — LACTATED RINGERS IV SOLN: 500.0000 mL | INTRAVENOUS | Status: DC | PRN

## 2019-01-19 MED ORDER — LIDOCAINE HCL (PF) 1 % IJ SOLN
INTRAMUSCULAR | Status: DC | PRN
  Administered 2019-01-19: 11 mL via EPIDURAL

## 2019-01-19 MED ORDER — OXYCODONE-ACETAMINOPHEN 5-325 MG PO TABS: 2.0000 | ORAL_TABLET | ORAL | Status: DC | PRN

## 2019-01-19 MED ORDER — FLEET ENEMA 7-19 GM/118ML RE ENEM: 1.0000 | ENEMA | RECTAL | Status: DC | PRN

## 2019-01-19 NOTE — Progress Notes (Signed)
Patient Vitals for the past 4 hrs:  BP Temp Temp src Pulse Resp SpO2  01/19/19 1011 - - - - - 98 %  01/19/19 1006 - - - - - 98 %  01/19/19 1003 129/67 - - 84 18 -  01/19/19 1001 - - - - - 98 %  01/19/19 0947 (!) 118/58 - - 84 18 -  01/19/19 0940 123/71 - - 80 18 99 %  01/19/19 0935 118/77 - - 100 18 99 %  01/19/19 0930 116/72 - - 90 18 99 %  01/19/19 0928 108/84 - - 89 18 -  01/19/19 0920 117/74 - - 85 18 98 %  01/19/19 0915 114/71 - - 89 18 99 %  01/19/19 0910 114/66 - - 81 18 -  01/19/19 0909 117/80 - - 79 18 99 %  01/19/19 0904 128/83 - - 88 18 99 %  01/19/19 0831 (!) 111/58 97.6 F (36.4 C) Oral 75 18 -  01/19/19 0730 97/72 - - 79 18 -  01/19/19 0717 109/75 97.9 F (36.6 C) Oral 81 18 -   Comfortable w/epidural. Foley still in, feels 3-4 cms.  Pitocin has been infusing at 76mu/min (couldn't get foley in at admission).  FHR Cat 1.  Ctx q 2-4 minutes. Will increase pitocin per protocol until ctx adequate. On clindamycin for oral abscess, but is on clear liquids and pt states she vomits if taken w/o food. Will need to order postpartum.

## 2019-01-19 NOTE — Progress Notes (Signed)
Patient Vitals for the past 4 hrs:  BP Temp Temp src Pulse Resp  01/19/19 1632 114/66 - - 73 18  01/19/19 1604 (!) 112/58 - - 78 18  01/19/19 1531 109/63 - - 72 18  01/19/19 1501 100/61 - - 83 18  01/19/19 1432 (!) 107/59 - - 80 18  01/19/19 1402 (!) 113/56 98.2 F (36.8 C) Oral 83 18  01/19/19 1332 107/61 - - 76 18  01/19/19 1301 109/62 - - 80 18   Pt comfortable w/epidural. Foley fell out around 1300. Pitocin at 26 mu/min, ctx still irregular q 2-6 minutes.  FHR 135, Cat 1.  No change in cx (4/thick/ballottable)  Discussed w/Dr. Rosana Hoes. Will do a pitocin break.

## 2019-01-19 NOTE — Progress Notes (Signed)
[facility]N [facility]: Labor Progress Note  Subjective:  Patient doing well. No Complaints.  Objective:  BP 122/73   Pulse 81   Temp 97.8 F (36.6 C) (Oral)   Resp 18   Ht 4\' 11"  (1.499 m)   Wt 101.6 kg   LMP 04/28/2018 Comment: Implant is 30 years old  BMI 45.22 kg/m  Gen: Lying in bed comfortably. NAD.  Extremities: No signs of DVT.   Speculum: No obvious lesions in vagina or around cervix.  CE: Dilation: 1 Effacement (%): Thick Cervical Position: Anterior Station: -2 Presentation: Vertex Exam by:: Dr Maudie Mercury Contractions: regular q2-75minutes FH: BL 140, mod var, + a, -d.   Assessment and Plan:  [name] is a 28 y.o. G2P1001 at [redacted]w[redacted]d - IOL Pre-e Labor: 1cm thick on last CE. Place FB. Continue low dose pitocin.   Pain control: Epidural and IV pain meds  Anticipated MOD: NSVD  PPH risk: high   Pre-eclampsia: Plts 147, UPC pending, BP: (105-135)/(65-89) 114/69 (06/28 0433)  Continue to monitor pressures closely  Follow up remained PIH labs   Fetal Wellbeing: Cephalic by CE.  Category I tracing.  GBS: Negative  Continuous fetal monitoring  Zettie Cooley, M.D.  Family Medicine  PGY-1 01/19/2019 5:27 AM

## 2019-01-19 NOTE — Anesthesia Preprocedure Evaluation (Signed)
Anesthesia Evaluation  Patient identified by MRN, date of birth, ID band Patient awake    Reviewed: Allergy & Precautions, NPO status , Patient's Chart, lab work & pertinent test results  Airway Mallampati: II  TM Distance: >3 FB Neck ROM: Full    Dental  (+) Poor Dentition   Pulmonary Current Smoker,    Pulmonary exam normal breath sounds clear to auscultation       Cardiovascular hypertension,  Rhythm:Regular Rate:Tachycardia     Neuro/Psych PSYCHIATRIC DISORDERS Anxiety Depression Bipolar Disorder negative neurological ROS     GI/Hepatic negative GI ROS, (+)     substance abuse  , Hepatitis -, C  Endo/Other  Morbid obesity  Renal/GU negative Renal ROS     Musculoskeletal negative musculoskeletal ROS (+) narcotic dependent  Abdominal (+) + obese,   Peds  Hematology negative hematology ROS (+)   Anesthesia Other Findings Dental abscess  Reproductive/Obstetrics (+) Pregnancy [redacted] weeks pregnant  Pre and post op fetal heat tones                             Anesthesia Physical  Anesthesia Plan  ASA: III  Anesthesia Plan: Epidural   Post-op Pain Management:    Induction:   PONV Risk Score and Plan: 2 and Ondansetron, Dexamethasone and Treatment may vary due to age or medical condition  Airway Management Planned:   Additional Equipment:   Intra-op Plan:   Post-operative Plan:   Informed Consent:   Plan Discussed with:   Anesthesia Plan Comments:         Anesthesia Quick Evaluation

## 2019-01-19 NOTE — Anesthesia Procedure Notes (Signed)
Epidural Patient location during procedure: OB Start time: 01/19/2019 8:58 AM End time: 01/19/2019 9:11 AM  Staffing Anesthesiologist: Lynda Rainwater, MD Performed: anesthesiologist   Preanesthetic Checklist Completed: patient identified, site marked, surgical consent, pre-op evaluation, timeout performed, IV checked, risks and benefits discussed and monitors and equipment checked  Epidural Patient position: sitting Prep: ChloraPrep Patient monitoring: heart rate, cardiac monitor, continuous pulse ox and blood pressure Approach: midline Location: L2-L3 Injection technique: LOR saline  Needle:  Needle type: Tuohy  Needle gauge: 17 G Needle length: 9 cm Needle insertion depth: 6 cm Catheter type: closed end flexible Catheter size: 20 Guage Catheter at skin depth: 11 cm Test dose: negative  Assessment Events: blood not aspirated, injection not painful, no injection resistance, negative IV test and no paresthesia  Additional Notes Reason for block:procedure for pain

## 2019-01-19 NOTE — H&P (Addendum)
Obstetric Admission History & Physical  Subjective [name] is a 28 y.o. female G2P1001 with IUP at [redacted]w[redacted]d by ultrasound at 7 wks admitted for induction of labor due to gestational HTN.  Reports fetal movement. Denies vaginal bleeding and ROM. She received her prenatal care at [facility].  Support person in labor: FOB   FAMILY TREE  LAB RESULTS  Language English Pap 06/26/18 neg  Initiated care at 12wk GC/CT Initial:  -/-          36wks:  Dating by 7wk U/S    Support person  Genetics NT/IT:        AFP:         cfDNA:    [place]/HgbE neg  Flu vaccine 07/30/18 CF declined  TDaP vaccine 4/29 SMA   Rhogam n/a      Blood Type O/Positive/-- (01/07 1523)  Anatomy US Female normal Antibody Negative (01/07 1523)  Feeding Plan breast HBsAg Negative (01/07 1523)  Contraception Nexplanon RPR Non Reactive (01/07 1523)  Circumcision n/a Rubella  3.83 (01/07 1523)  [place] HIV Non Reactive (01/07 1523)  Prenatal Classes Info given      GTT/A1C Early:      26-28wks:  69/86/83  BTL Consent n/a GBS  neg      VBAC Consent 09/09/18     Waterbirth [ ] Class [ ] Consent [ ] CNM visit PP Needs      Ultrasounds . 7+4-dating and viability . 18+1-female, low-lying placenta, limited heart views . 20+3-complete anatomy, posterior placenta clear to office by 2.5 cm . 24+3 low-lying posterior placenta, otherwise normal . 28+3 result placenta posterior, grade 0 . 36+1 BPP 8/8 EFW 2984g 65%ile  Prenatal History/Complications: . Chronic HCV . Hx of previous preeclampsia, PPH s/p TOLAC for failed IOL of 4366g baby in 2011 . Bipolar . HSV (on prophylaxis) . History of IVDU - on Zubsolv 8.6/2.1 BID . Tobacco Use  Past Medical History:  Diagnosis Date  . Anxiety   . Bipolar disorder ([place])   . Depression   . Hepatitis B   . Hepatitis C    dx 02/2018  2B  . History of heroin use    clean since 10/2017  . Sinus tachycardia    Past Surgical History:  Procedure Laterality Date   . ANTERIOR CRUCIATE LIGAMENT REPAIR    . APPENDECTOMY    . CESAREAN SECTION     x 1  . MULTIPLE EXTRACTIONS WITH ALVEOLOPLASTY N/A 10/08/2018   Procedure: EXTRACTION  OF TEETH #10 and #11 WITH INCISION AND DRAINAGE;  Surgeon: Diona Browner, DDS;  Location: [facility];  Service: Oral Surgery;  Laterality: N/A;  . WISDOM TOOTH EXTRACTION     OB History    Gravida  2   Para  1   Term  1   Preterm      AB  0   Living  1     SAB      TAB      Ectopic      Multiple      Live Births  1          Social History   Socioeconomic History  . Marital status: Single    Spouse name: Not on file  . Number of children: 1  . Years of education: 28  . Highest education level: 12th grade  Occupational History  . Occupation: homemaker  Social Needs  . Financial resource strain: Not hard at all  . Food insecurity  Worry: Never true    Inability: Never true  . Transportation needs    Medical: No    Non-medical: No  Tobacco Use  . Smoking status: Current Every Day Smoker    Packs/day: 0.50    Years: 10.00    Pack years: 5.00    Types: Cigarettes  . Smokeless tobacco: Never Used  . Tobacco comment: 7-8 cigs per day  Substance and Sexual Activity  . Alcohol use: No  . Drug use: No    Comment: clean since 10/2017  . Sexual activity: Not Currently    Birth control/protection: None    Comment: approx [redacted] wks gestation per patient  Lifestyle  . Physical activity    Days per week: 0 days    Minutes per session: 0 min  . Stress: To some extent  Relationships  . Social connections    Talks on phone: More than three times a week    Gets together: Once a week    Attends religious service: More than 4 times per year    Active member of club or organization: Yes    Attends meetings of clubs or organizations: More than 4 times per year    Relationship status: Living with partner  Other Topics Concern  . Not on file  Social History Narrative  . Not on file   Family History   Problem Relation Age of Onset  . Cancer Paternal Grandfather   . Heart attack Paternal Grandfather   . Suicidality Paternal Grandmother   . Anxiety disorder Maternal Grandmother   . Hypertension Maternal Grandmother   . Heart attack Maternal Grandfather   . Cancer Father   . Congenital heart disease Father   . Hypertension Mother   . Down syndrome Cousin   . Autism Brother    Allergies: Allergies  Allergen Reactions  . Haldol [Haloperidol Lactate] Other (See Comments)    Stiff neck    Medications:  No outpatient medications have been marked as taking for the 01/19/19 encounter [facility](Hospital Encounter).    Review of Systems  All systems reviewed and negative except as stated in HPI  Objective Physical Exam:  BP 108/65   Pulse 89   Temp 97.8 F (36.6 C) (Oral)   Resp 16   Ht 4\' 11"  (1.499 m)   Wt 101.6 kg   LMP 04/28/2018 Comment: Implant is 30 years old  BMI 45.22 kg/m  General appearance: alert, cooperative, appears stated age and no distress, poor dentition Lungs: no respiratory distress Heart: RRR. No BLEE.  Abdomen: soft, non-tender; gravid  Extremities: Moving spontaneously, warm, well perfused. 2+ DP.  Uterine activity: no contraction pain Cervical Exam:  Dilation: Fingertip Effacement (%): Thick Station: -3 Exam by:: Dr. Selena BattenKim Presentation: cephalic Fetal monitoring: baseline 140 / mod variability/ +a / -d  Prenatal labs: ABO, Rh: --/--/O POS (06/28 0050) Antibody: NEG (06/28 0050) Rubella: 3.83 (01/07 1523) RPR: Non Reactive (04/29 0833)  HBsAg: Negative (01/07 1523)  HIV: Non Reactive (04/29 0833)  GBS:   negative  Glucola: nml Genetic screening:  Negative, declined CF  Prenatal Transfer Tool  Maternal Diabetes: No Genetic Screening: Normal Maternal Ultrasounds/Referrals: Normal and Other:resolved low lying posterior placenta Fetal Ultrasounds or other Referrals:  None Maternal Substance Abuse:  No: Hx of IVDU Significant Maternal Medications:   Meds include: Other:  Significant Maternal Lab Results: Group B Strep negative  Results for orders placed or performed during the hospital encounter of 01/19/19 (from the past 24 hour(s))  CBC  Collection Time: 01/19/19 12:50 AM  Result Value Ref Range   WBC 18.3 (H) 4.0 - 10.5 K/uL   RBC 4.16 3.87 - 5.11 MIL/uL   Hemoglobin 11.3 (L) 12.0 - 15.0 g/dL   HCT 16.134.6 (L) 09.636.0 - 04.546.0 %   MCV 83.2 80.0 - 100.0 fL   MCH 27.2 26.0 - 34.0 pg   MCHC 32.7 30.0 - 36.0 g/dL   RDW 40.914.2 81.111.5 - 91.415.5 %   Platelets 147 (L) 150 - 400 K/uL   nRBC 0.0 0.0 - 0.2 %  Comprehensive metabolic panel   Collection Time: 01/19/19 12:50 AM  Result Value Ref Range   Sodium 136 135 - 145 mmol/L   Potassium 3.5 3.5 - 5.1 mmol/L   Chloride 105 98 - 111 mmol/L   CO2 19 (L) 22 - 32 mmol/L   Glucose, Bld 75 70 - 99 mg/dL   BUN 7 6 - 20 mg/dL   Creatinine, Ser 7.820.43 (L) 0.44 - 1.00 mg/dL   Calcium 8.9 8.9 - 95.610.3 mg/dL   Total Protein 6.6 6.5 - 8.1 g/dL   Albumin 2.9 (L) 3.5 - 5.0 g/dL   AST 24 15 - 41 U/L   ALT 11 0 - 44 U/L   Alkaline Phosphatase 135 (H) 38 - 126 U/L   Total Bilirubin 0.3 0.3 - 1.2 mg/dL   GFR calc non Af Amer >60 >60 mL/min   GFR calc Af Amer >60 >60 mL/min   Anion gap 12 5 - 15  Type and screen   Collection Time: 01/19/19 12:50 AM  Result Value Ref Range   ABO/RH(D) O POS    Antibody Screen NEG    Sample Expiration      01/22/2019,2359 Performed at [facility], [address] [place], [place] [address]     Patient Active Problem List   Diagnosis Date Noted  . Gestational hypertension 01/13/2019  . 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 Supervision of high risk pregnancy, antepartum 07/30/2018  . Suboxone maintenance treatment complicating pregnancy, antepartum (HCC) 07/30/2018  . Hx of preeclampsia, prior pregnancy, currently pregnant 07/30/2018  . History of postpartum hemorrhage, currently pregnant 07/30/2018  . Smoker 06/26/2018  . History of cesarean section 06/26/2018  .  Chronic hepatitis C virus genotype 2 infection (HCC) 03/15/2018  . Opioid use disorder (HCC) 03/05/2018  . Bipolar affective disorder (HCC) 03/05/2018  . PTSD (post-traumatic stress disorder) 03/05/2018    Assessment & Plan:  Iva BoopSarah A Constable is a 28 y.o. G2P1001 at 2578w0d - IOL g HTN  Labor: Cytotec contraindicated, will start low dose pitocin and attempt FB at next check. Will monitor contractions in the mean time  . Pain control: Epidural and IV pain meds . Anticipated MOD: NSVD . PPH risk: high   Pre-eclampsia: Plts 147, UPC pending, BP: (105-135)/(65-89) 114/69 (06/28 0433)  Continue to monitor pressures closely  Follow up remained PIH labs   Fetal Wellbeing: Cephalic by CE.  Category I tracing. . GBS: Negative . Continuous fetal monitoring  Postpartum Planning . Girl/breast/nexplanon(in) . Rubella immune, [facility]  . Social work: IVDU hx  Genia Hotterachel Kim, M.D.  Family Medicine  PGY-1 01/19/2019   Attestation: I have seen this patient and agree with the resident's documentation. I have examined them separately, and we have discussed the plan of care.  Cristal DeerLaurel S. Earlene PlaterWallace, DO OB/GYN Fellow

## 2019-01-19 NOTE — Progress Notes (Signed)
Patient Vitals for the past 4 hrs:  BP Temp Temp src Pulse Resp  01/19/19 2131 118/71 - - 83 18  01/19/19 2101 111/66 - - 78 18  01/19/19 2032 (!) 108/57 - - 79 20  01/19/19 2002 114/60 - - 79 18  01/19/19 1932 117/70 97.9 F (36.6 C) Oral 78 18  01/19/19 1900 106/66 - - 87 18  01/19/19 1832 (!) 106/54 - - 78 18   FHR Cat 1.  Mild/irregular ctx.  Pitocin restarted at 2 mu/min.  Epidural still in , cx 4/thick/-3.

## 2019-01-20 DIAGNOSIS — O134 Gestational [pregnancy-induced] hypertension without significant proteinuria, complicating childbirth: Secondary | ICD-10-CM

## 2019-01-20 DIAGNOSIS — Z3A37 37 weeks gestation of pregnancy: Secondary | ICD-10-CM

## 2019-01-20 MED ORDER — IBUPROFEN 800 MG PO TABS
800.0000 mg | ORAL_TABLET | Freq: Three times a day (TID) | ORAL | Status: DC
  Administered 2019-01-20 – 2019-01-22 (×6): 800 mg via ORAL
  Filled 2019-01-20 (×6): qty 1

## 2019-01-20 MED ORDER — MISOPROSTOL 200 MCG PO TABS
ORAL_TABLET | ORAL | Status: AC
  Filled 2019-01-20: qty 5

## 2019-01-20 MED ORDER — MISOPROSTOL 200 MCG PO TABS
1000.0000 ug | ORAL_TABLET | Freq: Once | ORAL | Status: AC
  Administered 2019-01-20: 1000 ug via RECTAL

## 2019-01-20 MED ORDER — TRANEXAMIC ACID-NACL 1000-0.7 MG/100ML-% IV SOLN
1000.0000 mg | Freq: Once | INTRAVENOUS | Status: AC
  Administered 2019-01-20: 23:00:00 1000 mg via INTRAVENOUS
  Filled 2019-01-20: qty 100

## 2019-01-20 MED ORDER — BUPRENORPHINE HCL-NALOXONE HCL 2-0.5 MG SL SUBL
6.0000 | SUBLINGUAL_TABLET | Freq: Two times a day (BID) | SUBLINGUAL | Status: DC
  Administered 2019-01-20: 10:00:00 6 via SUBLINGUAL
  Filled 2019-01-20: qty 6

## 2019-01-20 MED ORDER — BUPRENORPHINE HCL-NALOXONE HCL 2-0.5 MG SL SUBL
6.0000 | SUBLINGUAL_TABLET | Freq: Two times a day (BID) | SUBLINGUAL | Status: DC
  Administered 2019-01-20 – 2019-01-22 (×5): 6 via SUBLINGUAL
  Filled 2019-01-20 (×4): qty 6

## 2019-01-20 NOTE — Progress Notes (Addendum)
Spoke with pharmacy about confusion of patient's Suboxone orders.  Multiple doses of Suboxone ordered yesterday evening as patient did not want to take her full dose at such a late hour as she typically takes her doses at 0900 and 1600. Patient is prescribed 8.6mg /2.1 mg of ZUBSOLV twice daily at home, which converts to 12 mg / 3 mg of Suboxone twice daily (#6 2.0 mg/0.5mg  Suboxone twice daily per our hospital formulary). New orders have been placed for appropriate suboxone dosing for this patient.

## 2019-01-20 NOTE — Progress Notes (Signed)
OB/GYN Faculty Practice: Labor Progress Note  Subjective:  Patient complains of right lower quadrant pain that is sharp, constant and does not improve with position change.   Objective:  BP 110/64   Pulse 81   Temp 98.5 F (36.9 C) (Oral)   Resp 18   Ht 4\' 11"  (1.499 m)   Wt 101.6 kg   LMP 04/28/2018 Comment: Implant is 30 years old  SpO2 97%   BMI 45.22 kg/m  Gen: Lying in bed on right side. NAD.  Abdomen: Tender to deep palpation of right lower quadrant. No warmth, erythema. No guarding.   Extremities: No signs of DVT.   CE: Dilation: 6 Effacement (%): 70 Cervical Position: Posterior Station: -1 Presentation: Vertex Exam by:: [name] Contractions: regular q2-4 minutes FH: BL 140, mod variability, + accels, few and late variables   Assessment and Plan:  [name] is a 28 y.o. G2P1001 at [redacted]w[redacted]d - IOL gHTN  Labor: IUPC with no changes in contractions strength. Contractions q 2 minutes, regular, but still ~ 100-120 mvu / ten minutes. CE has changed to 6, 708% and -1. Reposition for pain and can consider calling anesthesiology if no relief. Pain is nonconcerning, most likely due to being restricted to bed due to epidural for > 24 hours . Pain control: Epidural . Anticipated MOD: NSVD . PPH Risk: med   Gestation Hypertension: blood pressures stable under 140 SBP and 90 DBP  Continue to monitor blood pressures  Fetal Wellbeing: Cat I tracing . GBS   negative . Continuous fetal monitoring  [name], M.D.  Family Medicine  PGY-1 01/20/2019 5:43 PM

## 2019-01-20 NOTE — Discharge Summary (Addendum)
Obstetrics Discharge Summary OB/GYN Faculty Practice   Patient Name: [name] DOB: [birth year] MRN: [id number]  Date of admission: 01/19/2019 Delivering MD: Tamera StandsWALLACE, LAUREL S   Date of discharge: 01/22/2019  Admitting diagnosis: Preg Intrauterine pregnancy: 8941w1d     Secondary diagnosis:   Principal Problem:   Gestational hypertension Active Problems:   Opioid use disorder (HCC)   Bipolar affective disorder (HCC)   Chronic hepatitis C virus genotype 2 infection (HCC)   History of cesarean section   Suboxone maintenance treatment complicating pregnancy, antepartum (HCC)   Hx of preeclampsia, prior pregnancy, currently pregnant   History of postpartum hemorrhage, currently pregnant   HSV-2 infection    Discharge diagnosis: Term Pregnancy Delivered, VBAC                                         Postpartum procedures: None  Complications: 1st degree perineal laceration repaired  PPH EBL 1L (TXA, cytotec)  Outpatient Follow-Up: [ ]  BP check [ ]  consider repeat H/H [facility]- PPH  Hospital course: [name] is a 28 y.o. 5441w1d who was admitted for induction of labor for gestational HTN. Her pregnancy was complicated by above noted. Her labor course was notable for induction starting with pitocin and FB as well as epidural placement and AROM. She was on pitocin for about 44 hours total. Delivery was complicated by PPH with EBL of 1L (received TXA and cytotec) as well as 1st degree perineal laceration which was repaired. Please see delivery/op note for additional details. Her HgB trended from 10.9 to 9.5 on PPD#1. Her blood pressure was well-controlled throughout her admission, and she remained asymptomatic from that standpoint. Her postpartum course was uncomplicated. She was breastfeeding without difficulty. By day of discharge, she was passing flatus, urinating, eating and drinking without difficulty. Her pain was well-controlled, and she was discharged home with ibuprofen. She will follow-up  in clinic in 4 weeks.   Physical exam  Vitals:   01/21/19 1412 01/21/19 1450 01/21/19 1746 01/22/19 0527  BP: 105/63 (!) 113/93 110/75 (!) 101/55  Pulse: 77 73 81 75  Resp:  20 18 18   Temp: 98.6 F (37 C) 98.4 F (36.9 C) 98.1 F (36.7 C) 98.2 F (36.8 C)  TempSrc: Oral Oral Oral Oral  SpO2: 99% 100% 100% 100%  Weight:      Height:       General: A&Ox3 Lochia: appropriate Uterine Fundus: firm Incision: N/A DVT Evaluation: No evidence of DVT seen on physical exam. Negative Homan's sign. No cords or calf tenderness. No significant calf/ankle edema.  Labs: Lab Results  Component Value Date   WBC 25.8 (H) 01/21/2019   HGB 9.6 (L) 01/21/2019   HCT 29.0 (L) 01/21/2019   MCV 81.9 01/21/2019   PLT 127 (L) 01/21/2019   CMP Latest Ref Rng & Units 01/19/2019  Glucose 70 - 99 mg/dL 75  BUN 6 - 20 mg/dL 7  Creatinine 4.090.44 - 8.111.00 mg/dL 9.14(N0.43(L)  Sodium 829135 - 562145 mmol/L 136  Potassium 3.5 - 5.1 mmol/L 3.5  Chloride 98 - 111 mmol/L 105  CO2 22 - 32 mmol/L 19(L)  Calcium 8.9 - 10.3 mg/dL 8.9  Total Protein 6.5 - 8.1 g/dL 6.6  Total Bilirubin 0.3 - 1.2 mg/dL 0.3  Alkaline Phos 38 - 126 U/L 135(H)  AST 15 - 41 U/L 24  ALT 0 - 44 U/L 11  Discharge instructions: Per After Visit Summary and "Baby and Me Booklet"  After visit meds:  Allergies as of 01/22/2019      Reactions   Haldol [haloperidol Lactate] Other (See Comments)   Stiff neck      Medication List    STOP taking these medications   acetaminophen 500 MG tablet Commonly known as: TYLENOL   acyclovir 400 MG tablet Commonly known as: ZOVIRAX   clindamycin 300 MG capsule Commonly known as: CLEOCIN     TAKE these medications   Buprenorphine HCl-Naloxone HCl 8.6-2.1 MG Subl Commonly known as: Zubsolv Place 1 Film under the tongue 2 (two) times a day.   ibuprofen 800 MG tablet Commonly known as: ADVIL Take 1 tablet (800 mg total) by mouth 3 (three) times daily.   multivitamin-prenatal 27-0.8 MG Tabs  tablet Take 1 tablet by mouth daily at 12 noon.       Postpartum contraception: Nexplanon Diet: Routine Diet Activity: Advance as tolerated. Pelvic rest for 6 weeks.   Follow-up Appt: Future Appointments  Date Time Provider [place]  01/27/2019 10:30 AM FT-FTOGBYN NURSE [facility]  02/20/2019 11:30 AM Cresenzo-Dishmon, Joaquim Lai, CNM [facility] FTOBG[facility]   Follow-up Visit:No follow-ups on file.  Please schedule this patient for Postpartum visit in: 4 weeks with the following provider: Any provider High risk pregnancy complicated by: gHTN  hep C, hx of C/S, HSV, substance use Delivery mode:  VBAC Anticipated Birth Control:  Nexplanon PP Procedures needed: BP check  Schedule Integrated BH visit: yes  Newborn Data: Live born female  Birth Weight:  5#15.9oz APGAR: 8, 9   Newborn Delivery   Birth date/time: 01/20/2019 22:16:00 Delivery type: VBAC, Spontaneous     Baby Feeding: Bottle and Breast Disposition:rooming in

## 2019-01-20 NOTE — Progress Notes (Signed)
LABOR PROGRESS NOTE  [name] is a 28 y.o. G2P1001 at [redacted]w[redacted]d  admitted for IOL for gHTN.   Subjective: Patient still with some confusion about the 6 hour mark after AROM. Went in to introduce myself to the patient. Explained that while some studies did show an increased risk of Hep C transmission to fetus after >6 hours of ROM that it was not an absolute contraindication for rupture and that we would not need to go back for a CS at the 6 hour mark solely based on rupture time. Patient voiced understanding and elected to try AROM over C-section at this time.   Objective: BP 106/68   Pulse (!) 116   Temp 97.9 F (36.6 C) (Oral)   Resp 16   Ht 4\' 11"  (1.499 m)   Wt 101.6 kg   LMP 04/28/2018 Comment: Implant is 30 years old  SpO2 97%   BMI 45.22 kg/m  or  Vitals:   01/20/19 0800 01/20/19 0825 01/20/19 0855 01/20/19 0936  BP: 114/78 113/67 114/66 106/68  Pulse: (!) 128 80 75 (!) 116  Resp: 18  16 16   Temp:      TempSrc:      SpO2:      Weight:      Height:        Dilation: 5 Effacement (%): 50 Cervical Position: Posterior Station: -3 Presentation: Vertex Exam by:: cat [name] FHT: baseline rate 140, moderate varibility, +acel, no decel Toco: q2-3 min   Labs: Lab Results  Component Value Date   WBC 15.8 (H) 01/19/2019   HGB 10.9 (L) 01/19/2019   HCT 32.3 (L) 01/19/2019   MCV 80.5 01/19/2019   PLT 151 01/19/2019    Patient Active Problem List   Diagnosis Date Noted  . Gestational hypertension 01/13/2019  . 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 Supervision of high risk pregnancy, antepartum 07/30/2018  . Suboxone maintenance treatment complicating pregnancy, antepartum ([place]) 07/30/2018  . Hx of preeclampsia, prior pregnancy, currently pregnant 07/30/2018  . History of postpartum hemorrhage, currently pregnant 07/30/2018  . Smoker 06/26/2018  . History of cesarean section 06/26/2018  . Chronic hepatitis C virus genotype 2 infection ([place]) 03/15/2018  . Opioid use  disorder ([place]) 03/05/2018  . Bipolar affective disorder ([place]) 03/05/2018  . PTSD (post-traumatic stress disorder) 03/05/2018    Assessment / Plan: 28 y.o. G2P1001 at [redacted]w[redacted]d here for IOL for gHTN.   Labor: Patient s/p FB. On pitocin at 30 mu/min. Has not yet achieved active labor despite Pit break last night and resumed titration of Pitocin back up to 30. AROM performed with moderate amount of clear fluid. Fetal head well engaged despite -3 station.  Fetal Wellbeing:  Cat I  Pain Control:  Epidural in place  Anticipated MOD:  NSVD   [name], D.O. OB Fellow  01/20/2019, 9:52 AM

## 2019-01-20 NOTE — Progress Notes (Addendum)
BPs normal. FHR 130's, Cat 1.  Ctx q 2.5-4 minutes.  Pitocin at 18 mu/min, being increased q 30 minutes.  Cx no change per RN exam. D/T Hep C, will not AROM early, continue w/pitocin.

## 2019-01-20 NOTE — Progress Notes (Signed)
OB/GYN Faculty Practice: Labor Progress Note  Subjective:  Patient doing well   Objective:  BP 104/63   Pulse 93   Temp 98 F (36.7 C) (Oral)   Resp 18   Ht 4\' 11"  (1.499 m)   Wt 101.6 kg   LMP 04/28/2018 Comment: Implant is 30 years old  SpO2 97%   BMI 45.22 kg/m  Gen: Lying in bed comfortably. NAD.  Extremities: No signs of DVT.   CE: Dilation: 4.5 Effacement (%): 50, 60 Cervical Position: Posterior Station: -2 Presentation: Vertex Exam by:: Dr Zettie Cooley Contractions: regular q2-4 minutes FH: BL 140, mod var, + a, -d.   Assessment and Plan:  [name] is a 28 y.o. G2P1001 at [redacted]w[redacted]d - IOL gHTN  Labor: s/p FB. S/p AROM, placed IUPC for pitocin titration. Will continue to monitor patient's progression. Patient would like to avoid cesarean. Currently, contractions inadequate at 120 mvu.  . Pain control: Epidural . Anticipated MOD: NSVD . PPH Risk: med   Gestation Hypertension: blood pressures stable. BP ranges (95-120)/(35-98) 104/63 (06/29 1412).   Continue to monitor blood pressures  Fetal Wellbeing: Cat I tracing . GBS   negative . Continuous fetal monitoring  Zettie Cooley, M.D.  Family Medicine  PGY-1 01/20/2019 2:45 PM

## 2019-01-20 NOTE — Progress Notes (Signed)
Faculty Practice OB/GYN Attending Note  Care assumed from Dr. Rosana Hoes.  I introduced myself to patient and her FOB.  Patient is comfortable, no complaints.  Blood pressure 113/67, pulse 80, temperature 97.9 F (36.6 C), temperature source Oral, resp. rate 18, height 4\' 11"  (1.499 m), weight 101.6 kg, last menstrual period 04/28/2018, SpO2 97 %. Category I FHR tracing with baseline in 130s Contractions q 3 mins. Dilation: 4 Effacement (%): 50 Cervical Position: Posterior Station: -3 Presentation: Vertex(Engaged) Exam by:: Dr. Harolyn Rutherford  Reviewed her labor course and discussed options for further management. *AROM:  Discussed this option given that patient has been on pitocin up to 30 mu/min with no cervical change. Patient is concerned about early AROM and increased risk of HCV vertical transmission seen in some studies, this was discussed with her. She was informed that the increased risk of HCV transmission after > 6 hours of ROM was seen in some studies; this is not an absolute contraindication as per ACOG or SMFM at this point. She was also told that 6 hours may not be adequate time to go from her current latent labor to delivery; and there was no indication to go to cesarean delivery at the six hour mark (unless there were other indications).  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  The other risks of cesarean section discussed with the patient included but were not limited to: bleeding which may require transfusion or reoperation; infection which may require antibiotics; injury to bowel, bladder, ureters or other surrounding organs; injury to the fetus; need for additional procedures including hysterectomy in the event of a life-threatening hemorrhage; placental  abnormalities wth subsequent pregnancies, incisional problems, thromboembolic phenomenon and other postoperative/anesthesia complications. Discussed that Duramorph and other other options will be used to decrease need for postoperative opiates, but cannot guarantee she will not need them given that this is a major surgery.  Patient will consider this options and let us know how she wants to proceed.   Will continue close observation.  Verita Schneiders, MD, [place] for [employer], [place]

## 2019-01-20 NOTE — Progress Notes (Signed)
Pt takes zubsolv 8.6 mg,(equivalent of suboxone 12 mg) BID.  States that she normally takes pm dose no later than 4pm or she "is awake all night".  Requests 1/2 dose for her pm dosing and to take at 9am and 4pm. Confirmed w/pharmacy.

## 2019-01-20 NOTE — Progress Notes (Signed)
OB/GYN Faculty Practice: Labor Progress Note  Subjective: Doing well, was feeling some RLQ pain but improved since getting extra PCA dose of epidural. Denies headaches, vision changes.   Objective: BP 107/67   Pulse 84   Temp 98.5 F (36.9 C) (Oral)   Resp 18   Ht 4\' 11"  (1.499 m)   Wt 101.6 kg   LMP 04/28/2018 Comment: Implant is 30 years old  SpO2 97%   BMI 45.22 kg/m  Gen: tired-appearing, NAD Dilation: 10 Dilation Complete Date: 01/20/19 Dilation Complete Time: 2045 Effacement (%): 70 Cervical Position: Posterior Station: Plus 1 Presentation: Vertex Exam by:: Dr Juleen China  Assessment and Plan: 28 y.o. G2P1001 [redacted]w[redacted]d here for IOL for gHTN.   Labor: Induction started nearly 48 hours ago. Now complete, +1/2 station. Will start pushing soon. Anticipate VBAC.  -- pain control: epidural in place -- PPH Risk: high (prolonged induction, BMI, PIH) - TXA to be given at time of delivery   GHTN: BP have been normal since admission, asymptomatic. Labs were wnl, UPC unremarkable.  -- continue to monitor closely   Fetal Well-Being: EFW 65% at 36w1. Cephalic by sutures.  -- Category I - continuous fetal monitoring  -- GBS negative    Jaze Rodino S. Juleen China, DO OB/GYN Fellow, Faculty Practice  8:58 PM

## 2019-01-20 NOTE — Progress Notes (Signed)
Patient Vitals for the past 4 hrs:  BP Temp Temp src Pulse Resp  01/20/19 0729 115/73 97.9 F (36.6 C) Oral 76 18  01/20/19 0701 110/63 - - 72 18  01/20/19 0631 105/68 - - 67 16  01/20/19 0601 (!) 95/49 - - 71 16  01/20/19 0531 110/65 - - 74 18  01/20/19 0501 108/73 - - 75 16  01/20/19 0431 106/66 - - 72 16  01/20/19 0401 105/63 98.3 F (36.8 C) Oral 81 16   Comfortable w/epidural.  FHR Cat 1. Cx at 0600 no change. Ctx pattern varies from q 3 minutes to q 2-5 minutes.  On 59mu/min of pitocin.  Discussed w/Dr. Rosana Hoes.  Will defer plan to next shift.  Pt expressed concern about having to take opioids if gets a CS.  Discussed that there are potentially other options (marcaine at incision, tramadol, toradol, etc) that could be explored.

## 2019-01-21 ENCOUNTER — Encounter (HOSPITAL_COMMUNITY): Payer: Self-pay

## 2019-01-21 DIAGNOSIS — Z30017 Encounter for initial prescription of implantable subdermal contraceptive: Secondary | ICD-10-CM

## 2019-01-21 LAB — CBC
HCT: 36.2 % (ref 36.0–46.0)
HCT: 29 % — ABNORMAL LOW (ref 36.0–46.0)
Hemoglobin: 11.9 g/dL — ABNORMAL LOW (ref 12.0–15.0)
Hemoglobin: 9.6 g/dL — ABNORMAL LOW (ref 12.0–15.0)
MCH: 27.2 pg (ref 26.0–34.0)
MCH: 27.1 pg (ref 26.0–34.0)
MCHC: 32.9 g/dL (ref 30.0–36.0)
MCHC: 33.1 g/dL (ref 30.0–36.0)
MCV: 82.8 fL (ref 80.0–100.0)
MCV: 81.9 fL (ref 80.0–100.0)
Platelets: 112 10*3/uL — ABNORMAL LOW (ref 150–400)
Platelets: 127 10*3/uL — ABNORMAL LOW (ref 150–400)
RBC: 4.37 MIL/uL (ref 3.87–5.11)
RBC: 3.54 MIL/uL — ABNORMAL LOW (ref 3.87–5.11)
RDW: 14.2 % (ref 11.5–15.5)
RDW: 14.2 % (ref 11.5–15.5)
WBC: 29.1 10*3/uL — ABNORMAL HIGH (ref 4.0–10.5)
WBC: 25.8 10*3/uL — ABNORMAL HIGH (ref 4.0–10.5)
nRBC: 0 % (ref 0.0–0.2)
nRBC: 0 % (ref 0.0–0.2)

## 2019-01-21 MED ORDER — ONDANSETRON HCL 4 MG PO TABS: 4.0000 mg | ORAL_TABLET | ORAL | Status: DC | PRN

## 2019-01-21 MED ORDER — PNEUMOCOCCAL VAC POLYVALENT 25 MCG/0.5ML IJ INJ
0.5000 mL | INJECTION | INTRAMUSCULAR | Status: AC
  Administered 2019-01-22: 16:00:00 0.5 mL via INTRAMUSCULAR
  Filled 2019-01-21: qty 0.5

## 2019-01-21 MED ORDER — SENNOSIDES-DOCUSATE SODIUM 8.6-50 MG PO TABS
2.0000 | ORAL_TABLET | ORAL | Status: DC
  Administered 2019-01-21 (×2): 2 via ORAL
  Filled 2019-01-21 (×2): qty 2

## 2019-01-21 MED ORDER — ACETAMINOPHEN 325 MG PO TABS
650.0000 mg | ORAL_TABLET | ORAL | Status: DC | PRN
  Administered 2019-01-21 – 2019-01-22 (×4): 650 mg via ORAL
  Filled 2019-01-21 (×4): qty 2

## 2019-01-21 MED ORDER — ETONOGESTREL 68 MG ~~LOC~~ IMPL
68.0000 mg | DRUG_IMPLANT | Freq: Once | SUBCUTANEOUS | Status: AC
  Administered 2019-01-21: 16:00:00 68 mg via SUBCUTANEOUS
  Filled 2019-01-21: qty 1

## 2019-01-21 MED ORDER — WITCH HAZEL-GLYCERIN EX PADS: 1.0000 "application " | MEDICATED_PAD | CUTANEOUS | Status: DC | PRN

## 2019-01-21 MED ORDER — BENZOCAINE-MENTHOL 20-0.5 % EX AERO
1.0000 "application " | INHALATION_SPRAY | CUTANEOUS | Status: DC | PRN
  Administered 2019-01-21: 1 via TOPICAL
  Filled 2019-01-21: qty 56

## 2019-01-21 MED ORDER — COCONUT OIL OIL: 1.0000 "application " | TOPICAL_OIL | Status: DC | PRN

## 2019-01-21 MED ORDER — SIMETHICONE 80 MG PO CHEW: 80.0000 mg | CHEWABLE_TABLET | ORAL | Status: DC | PRN

## 2019-01-21 MED ORDER — DIBUCAINE (PERIANAL) 1 % EX OINT: 1.0000 "application " | TOPICAL_OINTMENT | CUTANEOUS | Status: DC | PRN

## 2019-01-21 MED ORDER — DIPHENHYDRAMINE HCL 25 MG PO CAPS: 25.0000 mg | ORAL_CAPSULE | Freq: Four times a day (QID) | ORAL | Status: DC | PRN

## 2019-01-21 MED ORDER — PRENATAL MULTIVITAMIN CH
1.0000 | ORAL_TABLET | Freq: Every day | ORAL | Status: DC
  Administered 2019-01-21 – 2019-01-22 (×2): 1 via ORAL
  Filled 2019-01-21 (×2): qty 1

## 2019-01-21 MED ORDER — LIDOCAINE HCL 1 % IJ SOLN
0.0000 mL | Freq: Once | INTRAMUSCULAR | Status: AC | PRN
  Administered 2019-01-21: 3 mL via INTRADERMAL
  Filled 2019-01-21: qty 20

## 2019-01-21 MED ORDER — ONDANSETRON HCL 4 MG/2ML IJ SOLN: 4.0000 mg | INTRAMUSCULAR | Status: DC | PRN

## 2019-01-21 NOTE — Procedures (Signed)
Procedure Note  PRE-Procedure DIAGNOSIS: Encounter for insertion of subdermal contraceptive POST-Procedure DIAGNOSIS: Encounter for insertion of subdermal contraceptive PROCEDURE: Nexplanon  placement Performing Physician: Zettie Cooley, MD Supervising Physician (if applicable): N/A  Device information:  Serial # [id number]  Lot # [id number]  Expiration date 2022 OCT 21  PROCEDURE:  Site (check):  Left  Sterile Preparation: Betadine   Patient identified, informed consent performed, consent signed. Time out performed.  Insertion site was selected 8 - 10 cm from medial epicondyle and insertion site was marked. Patient's left arm was prepped and draped in usual sterile fashion. Procedure area was prepped and draped in a sterile fashion. Patient was prepped with alcohol swab and then injected with 3 ml of 1% lidocaine for subcutaneous anesthesia. Anesthesia was confirmed. Nexplanon  trocar was inserted subcutaneously and then Nexplanon  capsule delivered subcutaneously. Trocar was removed from the insertion site. Nexplanon  capsule was palpated by provider and patient to assure satisfactory placement. There was minimal blood loss and hemostasis was achieved with pressure. Gauze and coban dressing applied to insertion site.   Followup:  The patient tolerated the procedure well without complications. Patient to leave pressure dressing in place for 4 hours.  Standard post-procedure care is explained and return precautions are given.   Zettie Cooley, M.D.  Family Medicine  PGY-1 01/21/2019 3:59 PM

## 2019-01-21 NOTE — Anesthesia Postprocedure Evaluation (Signed)
Anesthesia Post Note  Patient: [name]  Procedure(s) Performed: AN AD [place]     Patient location during evaluation: Mother Baby Anesthesia Type: Epidural Level of consciousness: awake and alert Pain management: pain level controlled Vital Signs Assessment: post-procedure vital signs reviewed and stable Respiratory status: spontaneous breathing, nonlabored ventilation and respiratory function stable Cardiovascular status: stable Postop Assessment: no headache, no backache, epidural receding, no apparent nausea or vomiting, patient able to bend at knees, adequate PO intake and able to ambulate Anesthetic complications: no    Last Vitals:  Vitals:   01/21/19 0142 01/21/19 0545  BP: 121/80 115/69  Pulse: 94 79  Resp: 18 16  Temp: 36.9 C 36.8 C  SpO2: 97% 97%    Last Pain:  Vitals:   01/21/19 0545  TempSrc: Oral  PainSc: 4    Pain Goal:                   [employer]

## 2019-01-21 NOTE — Lactation Note (Signed)
This note was copied from a baby's chart. Lactation Consultation Note Baby 5 hrs old. Mom states baby has latched well to her Lt. Breast. Lt. Breast 2 sizes smaller than Rt. Breast. Breast has "V" shape w/short shaft nipple at bottom of breast. Breast tissue deep in breast is hard. Skin to top of breast soft and compressible. Hand expression taught w/colostrum noted. Noted baby's arms very jittery. Mom stated they hadn't been that way. Reported to RN. Latched baby STS to Rt. Breast. Baby held nipple for a while before started suckling.  Mom is positive for Hep. B & C. Mentioned to mom she shouldn't BF if her nipples are cracked or bleeding, mom stated she knew. Mom didn't BF her first child and would like to try BF this baby.  Newborn behavior, feeding habits, STS, I&O, breast massage, milk storage, supply and demand discussed. Information of ETI reviewed as well as supplementing if needed.  RN is going to set up DEBP for stimulation. Mom agrees to pump. Encouraged mom to call for assistance or questions. [place] answered a lot of questions mom had. Lactation brochure given.  Patient Name: [name] Date: 01/21/2019 Reason for consult: Initial assessment;1st time breastfeeding;Infant < 6lbs;Early term 37-38.6wks   Maternal Data Has patient been taught Hand Expression?: Yes Does the patient have breastfeeding experience prior to this delivery?: No  Feeding Feeding Type: Breast Fed  LATCH Score Latch: Repeated attempts needed to sustain latch, nipple held in mouth throughout feeding, stimulation needed to elicit sucking reflex.  Audible Swallowing: A few with stimulation  Type of Nipple: Everted at rest and after stimulation  Comfort (Breast/Nipple): Filling, red/small blisters or bruises, mild/mod discomfort(breast hard deep w/in)  Hold (Positioning): Assistance needed to correctly position infant at breast and maintain latch.  LATCH Score:  7  Interventions Interventions: Breast feeding basics reviewed;Adjust position;Assisted with latch;Support pillows;Skin to skin;Position options;Breast massage;Hand express;Breast compression  Lactation Tools Discussed/Used WIC Program: Yes Pump Review: Milk Storage   Consult Status Consult Status: Follow-up Date: 01/22/19 Follow-up type: In-patient    Evoleth Nordmeyer, Elta [place] 01/21/2019, 3:40 AM

## 2019-01-21 NOTE — Lactation Note (Signed)
This note was copied from a baby's chart. Lactation Consultation Note  Patient Name: Girl Charese Abundis [id number] Date: 01/21/2019 Reason for consult: Follow-up assessment;Early term 37-38.6wks;Infant weight loss  20 hours old ETI < 6 lbs who is being exclusively BF by her mother, she's a P2 but not experienced BF. Mom called for assistance, LC observed a feeding at the breast and noticed that baby is not transferring as she should, no audible swallows heard at this point but noticed the long movements of the jaw which may indicate some degree of transfer, baby fed in cross cradle position for 7 minutes, she's at 1% weight loss.  Mom's Hepatitis B and C status is (+) and she's already aware that she should d/c BF if any bleeding occurs. Mom has sheet for supplementation guidelines which will start tonight, due to baby's weight a 22 calorie formula will be needed; mom hasn't been pumping consistently, she got discouraged because "nothing was coming out".   [place] notices that the junctures in her pump were loose and adjusted them, and let her know that she'll feel more suction the next time she pumps. Explained to mom that the purpose of pumping at this stage is mainly for breast stimulation, and not to get volume, she voiced understanding.  Feeding plan:  1. Encouraged mom to feed baby STS 8-12 times/24 hours or sooner if feeding cues are present 2. She'll try pumping every 3 hours after feedings 3. Baby will start supplementation with Similac 22 calorie formula tonight according to formula supplementation guidelines, RN notified  Mom reported all questions and concerns were answered, she's aware of [place] services and will call PRN.  Maternal Data    Feeding Feeding Type: Breast Fed  LATCH Score Latch: Repeated attempts needed to sustain latch, nipple held in mouth throughout feeding, stimulation needed to elicit sucking reflex.  Audible Swallowing: None  Type of Nipple: Everted at rest and after  stimulation  Comfort (Breast/Nipple): Soft / non-tender  Hold (Positioning): Assistance needed to correctly position infant at breast and maintain latch.  LATCH Score: 6  Interventions Interventions: Breast feeding basics reviewed;Assisted with latch;Skin to skin;Breast massage;Hand express;Breast compression;Adjust position;Support pillows;DEBP  Lactation Tools Discussed/Used Tools: Pump Breast pump type: Double-Electric Breast Pump   Consult Status Consult Status: Follow-up Date: 01/22/19 Follow-up type: In-patient    Sianna Garofano Francene Boyers 01/21/2019, 7:15 PM

## 2019-01-21 NOTE — Progress Notes (Signed)
Post Partum Day 1 Subjective: Doing well, sitting up in bed breastfeeding. Denies any dizziness or lightheadedness.  Objective: Blood pressure 115/69, pulse 79, temperature 98.2 F (36.8 C), temperature source Oral, resp. rate 16, height 4\' 11"  (1.499 m), weight 101.6 kg, last menstrual period 04/28/2018, SpO2 97 %, unknown if currently breastfeeding.  Physical Exam:  General: alert, well-appearing, NAD Lochia: appropriate Uterine Fundus: firm Incision: n/a DVT Evaluation: bilateral 1+ non-pitting edema  Recent Labs    01/21/19 0219 01/21/19 0611  HGB 11.9* 9.6*  HCT 36.2 29.0*    Assessment/Plan: Plan for discharge tomorrow  Continue routine postpartum care HgB stable Desires inpatient Nexplanon today    LOS: 2 days   [place] S Bertine Schlottman, DO 01/21/2019, 8:26 AM

## 2019-01-21 NOTE — Clinical Social Work Maternal (Signed)
CLINICAL SOCIAL WORK MATERNAL/CHILD NOTE  Patient Details  Name: [name] MRN: [id number] Date of Birth: 1991/02/03  Date:  01/21/2019  Clinical Social Worker Initiating Note:  Elijio Miles Date/Time: Initiated:  01/21/19/0902     Child's Name:  [name]   Biological Parents:  Mother, Father([name] and [name] DOB: 10/01/1981)   Need for Interpreter:  None   Reason for Referral:  Behavioral Health Concerns, Current Substance Use/Substance Use During Pregnancy    Address:  [place] [address]    Phone number:  [phone] (home)     Additional phone number:   Household Members/Support Persons (HM/SP):   Household Member/Support Person 1, Household Member/Support Person 2   HM/SP Name Relationship DOB or Age  HM/SP -[place] FOB 10/01/1981  HM/SP -2 [name] Daughter 11/30/2009  HM/SP -3        HM/SP -4        HM/SP -5        HM/SP -6        HM/SP -7        HM/SP -8          Natural Supports (not living in the home):  Friends, Immediate Family, Artist Supports: None   Employment: Disabled   Type of Work:     Education:  [place] arranged:    Museum/gallery curator Resources:  [employer], Medicare    Other Resources:  Physicist, medical ([name] expressed interest in applying for [facility] and was provided with information)   Cultural/Religious Considerations Which May Impact Care:    Strengths:  Ability to meet basic needs , Home prepared for child , Pediatrician chosen   Psychotropic Medications:         Pediatrician:    [place]  Pediatrician List:   [facility]      Pediatrician Fax Number:    Risk Factors/Current Problems:  Mental Health Concerns , Substance Use    Cognitive State:  Able to Concentrate , Alert , Linear Thinking    Mood/Affect:   Bright , Calm , Comfortable , Relaxed , Interested    CSW Assessment:  CSW received consult for history of bipolar, anxiety and PTSD.  CSW met with [name] to offer support and complete assessment.    [name] resting in bed with infant asleep at bedside and FOB walking around room, when CSW entered the room. CSW introduced self and received verbal permission from [name] to have FOB step out of the room to complete assessment. CSW did not sense any hostility but as FOB was leaving the room he flipped CSW off and told CSW to "eat a dick" and mumbled something under his breath as he was closing the door. CSW inquired about reasoning for FOB being upset and [name] stated he was concerned CSW was coming to take the baby. Per [name], she had tried to prep FOB before infant delivered that someone would have to come talk to her since she was on Zubsolv. CSW offered to clarify further the reason and purpose for visit with FOB, if [name] would like. [name] reported she would let CSW know. CSW explained reason for visit with [name] who expressed understanding. [name] reported she currently lives with FOB and their 30-year-old daughter in [place]. [name] stated her highest level  of education is some college and reported she is not currently working but that she receives disability. [name] confirmed that she receives food stamps and expressed interest in getting established with WIC. CSW provided [name] with [facility] phone number so that she could call and set up an appointment. [name] aware of process to get infant added on to her food stamps and Medicaid.   CSW inquired about [name]'s mental health history and [name] acknowledged having a history of bipolar disorder and depression which started around age 7. [name] reported she wasn't formally diagnosed until she was 77. [name] shared with CSW that she hasn't needed medications in over 3 years as she has gone through DBT and CBT therapy. CSW inquired about if [name] had been able to establish with anyone for  psychiatry services as that had been discussed early in her pregnancy. [name] reported she has had difficulty getting to her appointments at the [facility] as [name] reported transportation services will not go from [facility] to [facility]. [name] also stated that her mother and FOB work so they are not able to take her. [name] denied any mental health symptoms during her pregnancy and reported symptoms were manageable. CSW provided education regarding the baby blues period vs. perinatal mood disorders, discussed treatment and gave resources for mental health follow up if concerns arise. CSW recommended self-evaluation during the postpartum time period using the New Mom Checklist from Postpartum Progress and encouraged [name] to contact a medical professional if symptoms are noted at any time. [name] denied any current SI, HI or DV and reported having good support from FOB, her mother, her mother-in-law and her best-friend. CSW to make [facility] referral for additional support for infant and [name], with [name]'s permission.  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 Per [name], she has been clean since 11/2017 with use of Zubsolv that is [facility]. CSW congratulated [name] on her sobriety. [name] reported that she feels like the medication is helpful in managing her withdrawal symptoms. [name] reported FOB is also being followed at the [facility] and is prescribed Suboxone. CSW informed [name] of Hospital Drug Policy and explained UDS and CDS were still pending but that a CPS report would be made, if warranted. [name] asked appropriate questions and denied any concerns regarding policy.     [name] reported having all essential items for infant once discharged and reported infant would be sleeping in a basinet once home. CSW provided review of Sudden Infant Death Syndrome (SIDS) precautions and safe sleeping habits.     CSW Plan/Description:  No Further Intervention Required/No Barriers to Discharge, Sudden Infant Death Syndrome (SIDS) Education, Perinatal Mood and Anxiety Disorder (PMADs) Education, [place], Other Information/Referral to [employer], CSW Will Continue to Monitor Umbilical Cord Tissue Drug Screen Results and Make Report if [place], [place] 01/21/2019, 10:27 AM

## 2019-01-22 ENCOUNTER — Encounter: Payer: Self-pay | Admitting: Internal Medicine

## 2019-01-22 MED ORDER — ZUBSOLV 8.6-2.1 MG SL SUBL: 1.0000 | SUBLINGUAL_TABLET | Freq: Two times a day (BID) | SUBLINGUAL | 0 refills | Status: DC

## 2019-01-22 MED ORDER — IBUPROFEN 800 MG PO TABS: 800.0000 mg | ORAL_TABLET | Freq: Three times a day (TID) | ORAL | 0 refills | Status: DC

## 2019-01-22 NOTE — Lactation Note (Addendum)
This note was copied from a baby's chart. Lactation Consultation Note  Patient Name: [name] [id number] Date: 01/22/2019   I walked into Mom's room and Mom said infant "was having a hard time with this formula" b/c of "choking."  A regular nipple was noted on the bottle. A yellow Similac nipple was noted in room, which Mom said was better, but still presented difficulty for baby.   I brought an Enfamil Extra-Slow Flow nipple into room. I asked Mom to call for me so I could observe infant bottle feed. I suggested to Dr. Corinna Capra that we get a consult for PT/SLP to determine best nipple flow rate.   On further clarification of what "choking" meant to Mom, she explained that it was like a "gurgling" in the chest during and after the bottle feeding. Mom denied any color change.  Matthias Hughs [facility] 01/22/2019, 9:10 AM

## 2019-01-22 NOTE — Care Management Important Message (Signed)
Important Message  Patient Details  Name: [name] MRN: [id number] Date of Birth: [birth year]   Medicare Important Message Given:  Yes     Shelda Altes 01/22/2019, 11:51 AM

## 2019-01-22 NOTE — Discharge Instructions (Signed)

## 2019-01-23 ENCOUNTER — Ambulatory Visit: Payer: Self-pay

## 2019-01-23 NOTE — Lactation Note (Signed)
This note was copied from a baby's chart. Lactation Consultation Note  Patient Name: Girl Shekinah Pitones [id number] Date: 01/23/2019 Reason for consult: Follow-up assessment   Baby 43 hours old.  < 6 lbs [redacted]w[redacted]d. Mother states she is occasionally putting baby to the breast and pumping about q 6 hours. Encouraged pumping a minimum of 8 times per day. Reviewed hand expression w/ glistening expressed. Demonstrated to how do hands on pumping to increase her supply. Reviewed feeding frequency and volume. Recommend mother post pump 8 times per day for 10-20 min with DEBP on initiation setting. Give baby back volume pumped at the next feeding. Reviewed cleaning and milk storage.  Reviewed engorgement care and monitoring voids/stools.    Maternal Data Has patient been taught Hand Expression?: Yes Does the patient have breastfeeding experience prior to this delivery?: No  Feeding Feeding Type: Formula Nipple Type: Dr. Clement Husbands  LATCH Score                   Interventions Interventions: Breast feeding basics reviewed;Hand express  Lactation Tools Discussed/Used Tools: Pump Breast pump type: Double-Electric Breast Pump   Consult Status Consult Status: Complete Date: 01/23/19    Vivianne Master [facility] 01/23/2019, 9:26 AM

## 2019-01-27 ENCOUNTER — Telehealth: Payer: Self-pay | Admitting: *Deleted

## 2019-01-27 MED ORDER — MISOPROSTOL 200 MCG PO TABS: 200.0000 ug | ORAL_TABLET | Freq: Three times a day (TID) | ORAL | 0 refills | Status: DC

## 2019-01-27 NOTE — Telephone Encounter (Signed)
Patient states she has been passing clots, some the size of softballs and having cramping.  Emptying her bladder frequently.  She did require additional meds post delivery for [facility] and has a history of a PPH.  Discussed with Dr Elonda Husky and will send in cytotec 200mg  TID for the next 3 days.  Advised to let us know or go to the hospital if clots continue despite taking medication. Pt verbalized understanding.

## 2019-01-28 ENCOUNTER — Encounter: Payer: Self-pay | Admitting: *Deleted

## 2019-01-28 ENCOUNTER — Ambulatory Visit: Payer: Medicare Other | Admitting: *Deleted

## 2019-01-28 ENCOUNTER — Other Ambulatory Visit: Payer: Self-pay

## 2019-01-28 VITALS — BP 124/81 | HR 83

## 2019-01-28 DIAGNOSIS — Z0130 Encounter for examination of blood pressure without abnormal findings: Secondary | ICD-10-CM

## 2019-01-28 NOTE — Progress Notes (Signed)
Pt here for BP check. BP was 124/81. Dr. Elonda Husky advised it's perfect. Pt to return @ scheduled appt on 02/15/19 or sooner if needed. Pt voiced understanding. [place]

## 2019-02-10 ENCOUNTER — Encounter: Payer: Self-pay | Admitting: *Deleted

## 2019-02-19 ENCOUNTER — Ambulatory Visit: Payer: Medicare Other | Admitting: Advanced Practice Midwife

## 2019-02-19 ENCOUNTER — Encounter: Payer: Self-pay | Admitting: Internal Medicine

## 2019-02-19 ENCOUNTER — Encounter: Payer: Self-pay | Admitting: *Deleted

## 2019-02-20 ENCOUNTER — Ambulatory Visit: Payer: Medicare Other | Admitting: Advanced Practice Midwife

## 2019-02-21 ENCOUNTER — Other Ambulatory Visit: Payer: Self-pay

## 2019-02-21 ENCOUNTER — Encounter: Payer: Self-pay | Admitting: Student in an Organized Health Care Education/Training Program

## 2019-02-21 ENCOUNTER — Ambulatory Visit (INDEPENDENT_AMBULATORY_CARE_PROVIDER_SITE_OTHER): Payer: Medicare Other | Admitting: Student in an Organized Health Care Education/Training Program

## 2019-02-21 DIAGNOSIS — F1199 Opioid use, unspecified with unspecified opioid-induced disorder: Secondary | ICD-10-CM

## 2019-02-21 DIAGNOSIS — F319 Bipolar disorder, unspecified: Secondary | ICD-10-CM

## 2019-02-21 DIAGNOSIS — F1120 Opioid dependence, uncomplicated: Secondary | ICD-10-CM

## 2019-02-21 DIAGNOSIS — F1190 Opioid use, unspecified, uncomplicated: Secondary | ICD-10-CM

## 2019-02-21 DIAGNOSIS — O99325 Drug use complicating the puerperium: Secondary | ICD-10-CM

## 2019-02-21 MED ORDER — ZUBSOLV 5.7-1.4 MG SL SUBL: 1.0000 | SUBLINGUAL_TABLET | Freq: Three times a day (TID) | SUBLINGUAL | 0 refills | Status: DC

## 2019-02-21 NOTE — Assessment & Plan Note (Signed)
History of a anxiety and depression syndrome, previously labeled as bipolar affective, now not on medication treatments or mood stabilizer.  Mood seems to be doing fairly well.  I am worried about all the stress that comes with a new baby.  Going to ask our [facility] counselor to arrange a telephone visit with her.

## 2019-02-21 NOTE — Assessment & Plan Note (Signed)
Stable and doing very well.  Lots of stress right now with a 52-month-old baby, recent delivery.  Cravings are well controlled.  She would like to go down on the buprenorphine dose to the level she was at pre-pregnancy which I think is reasonable.  Plan is to change of ZUBSOLV from 8.6 mg twice a day to the ZUBSOLV 5.7 mg tablet, 2.5 tablets daily for 1 week and then if she feels well enough to decrease it to 2 tablets daily after that.  This is the dose that she was on for many months and stable.  Given the increased stress in her life I am also going to have her follow-up with Dessie Coma, our [facility] counselor.  We will plan for a telephone visit in 4 weeks to see how she is doing.

## 2019-02-21 NOTE — Progress Notes (Signed)
  [facility] Internal Medicine Residency Telephone Encounter Continuity Care Appointment  HPI:   This telephone encounter was created for Ms. Lolita Cram on 02/21/2019 for the following purpose/cc opioid use disorder.   Patient is a 28 year old person with opioid use disorder doing a telephone follow-up for management of medication assisted treatment.  She is doing very well.  She delivered her baby early at the end of June.  Currently has a 2-week old daughter named Estill Bamberg.  The delivery went very well.  Patient was able to do a vaginal delivery.  She reports using ZUBSOLV throughout the hospitalization and had no signs of withdrawal.  Her pain is well controlled with ibuprofen.  Baby did well, she had no signs of neonatal withdrawal and was able to go home on day 2.  She has been following up with pediatrician and the baby is low weights and low height, but overall making progress.  They are using breastmilk and supplementing with high calorie formula.  Patient reports that there is extra stress, however she is doing well.  Has had no cravings.  Has had no relapses.  She has a history of depression and anxiety, however reports that her mood is stable.  Says she does have some anxiety now and then, not uncontrolled.  She has been doing lots of support and follow-up to monitor for postpartum depression.  Currently lives with her partner, his mother, 95-year-old daughter, and reports feeling safe at home.   Past Medical History:  Past Medical History:  Diagnosis Date  . Anxiety   . Bipolar disorder ([place])   . Depression   . H/O wisdom tooth extraction   . Hepatitis B   . Hepatitis C    dx 02/2018  2B  . History of heroin use    clean since 10/2017  . Hx of appendectomy   . Post traumatic stress disorder (PTSD)   . Postpartum hemorrhage   . Sinus tachycardia   . Smoker       ROS:   No fevers, chills, or cough   Assessment / Plan / Recommendations:   Please see A&P under problem  oriented charting for assessment of the patient's acute and chronic medical conditions.   As always, pt is advised that if symptoms worsen or new symptoms arise, they should go to an urgent care facility or to to ER for further evaluation.   Consent and Medical Decision Making:   This is a telephone encounter between MAKYLEE SANBORN and Axel Filler on 02/21/2019 for management of opioid use disorder. The visit was conducted with the patient located at home and Axel Filler at [facility]. The patient's identity was confirmed using their DOB and current address. The patient has consented to being evaluated through a telephone encounter and understands the associated risks (an examination cannot be done and the patient may need to come in for an appointment) / benefits (allows the patient to remain at home, decreasing exposure to coronavirus). I personally spent 11 minutes on medical discussion.

## 2019-02-21 NOTE — Addendum Note (Signed)
Addended by: Lalla Brothers T on: 02/21/2019 10:57 AM   Modules accepted: Level of Service

## 2019-02-25 ENCOUNTER — Telehealth: Payer: Self-pay | Admitting: Licensed Clinical Social Worker

## 2019-02-25 NOTE — Telephone Encounter (Signed)
Patient was contacted due to a request from her doctor. Patient did not answer, and a vm was left for the patient to call our office back if she would like to set up a future appointment.

## 2019-03-12 ENCOUNTER — Encounter (HOSPITAL_COMMUNITY): Payer: Self-pay | Admitting: *Deleted

## 2019-03-12 ENCOUNTER — Other Ambulatory Visit: Payer: Self-pay

## 2019-03-12 NOTE — Progress Notes (Signed)
Patient denies shortness of breath, fever, cough and chest pain  PCP - Gilles Chiquito, MD Cardiologist - denies Chest x-ray - denies EKG - denies Stress Test - denies ECHO - denies Cardiac Cath - denies Sleep Study - denies CPAP - n/a Recent labs-denies Fasting Blood Sugar - n/a Blood Thinner Instructions: n/a Aspirin Instructions: n/a Pt made aware to stop taking Aspirin (unless otherwise advised by surgeon), Buprenorphine HCl-Naloxone HCl (ZUBSOLV), vitamins, fish oil and herbal medications. Do not take any NSAIDs ie: Ibuprofen, Advil, Naproxen (Aleve), Motrin, BC and Goody Powder. Anesthesia review: n/a  Coronavirus Screening  Have you experienced the following symptoms:  Cough yes/no: No Fever (>100.53F)  yes/no: No Runny nose yes/no: No Sore throat yes/no: No Difficulty breathing/shortness of breath  yes/no: No  Have you or a family member traveled in the last 14 days and where? yes/no: No  Pt reminded that hospital visitation restrictions are in effect and the importance of the restrictions.   Patient verbalized understanding of all pre-op  Instructions.

## 2019-03-13 ENCOUNTER — Encounter (HOSPITAL_COMMUNITY)
Admission: RE | Disposition: A | Discharge status: Home / Self Care | Payer: Self-pay | Source: Home / Self Care | Attending: Oral Surgery

## 2019-03-13 ENCOUNTER — Ambulatory Visit (HOSPITAL_COMMUNITY): Payer: Medicare Other | Admitting: Anesthesiology

## 2019-03-13 ENCOUNTER — Other Ambulatory Visit: Payer: Self-pay

## 2019-03-13 ENCOUNTER — Encounter (HOSPITAL_COMMUNITY): Payer: Self-pay | Admitting: Certified Registered"

## 2019-03-13 ENCOUNTER — Ambulatory Visit (HOSPITAL_COMMUNITY)
Admission: RE | Admit: 2019-03-13 | Discharge: 2019-03-13 | Disposition: A | Discharge status: Home / Self Care | Payer: Medicare Other | Attending: Oral Surgery | Admitting: Oral Surgery

## 2019-03-13 DIAGNOSIS — K0850 Unsatisfactory restoration of tooth, unspecified: Secondary | ICD-10-CM | POA: Diagnosis not present

## 2019-03-13 DIAGNOSIS — F418 Other specified anxiety disorders: Secondary | ICD-10-CM | POA: Diagnosis not present

## 2019-03-13 DIAGNOSIS — Z6841 Body Mass Index (BMI) 40.0 and over, adult: Secondary | ICD-10-CM | POA: Insufficient documentation

## 2019-03-13 DIAGNOSIS — I10 Essential (primary) hypertension: Secondary | ICD-10-CM | POA: Diagnosis not present

## 2019-03-13 DIAGNOSIS — Z20828 Contact with and (suspected) exposure to other viral communicable diseases: Secondary | ICD-10-CM | POA: Insufficient documentation

## 2019-03-13 DIAGNOSIS — K029 Dental caries, unspecified: Secondary | ICD-10-CM | POA: Insufficient documentation

## 2019-03-13 DIAGNOSIS — F17200 Nicotine dependence, unspecified, uncomplicated: Secondary | ICD-10-CM | POA: Diagnosis not present

## 2019-03-13 HISTORY — DX: Dental caries, unspecified: K02.9

## 2019-03-13 HISTORY — DX: Unspecified pre-eclampsia, unspecified trimester: O14.90

## 2019-03-13 HISTORY — PX: TOOTH EXTRACTION: SHX859

## 2019-03-13 LAB — COMPREHENSIVE METABOLIC PANEL
ALT: 32 U/L (ref 0–44)
AST: 37 U/L (ref 15–41)
Albumin: 3.8 g/dL (ref 3.5–5.0)
Alkaline Phosphatase: 58 U/L (ref 38–126)
Anion gap: 11 (ref 5–15)
BUN: 15 mg/dL (ref 6–20)
CO2: 20 mmol/L — ABNORMAL LOW (ref 22–32)
Calcium: 9 mg/dL (ref 8.9–10.3)
Chloride: 108 mmol/L (ref 98–111)
Creatinine, Ser: 0.59 mg/dL (ref 0.44–1.00)
GFR calc Af Amer: >60 mL/min (ref >60)
GFR calc non Af Amer: >60 mL/min (ref >60)
Glucose, Bld: 79 mg/dL (ref 70–99)
Potassium: 4.1 mmol/L (ref 3.5–5.1)
Sodium: 139 mmol/L (ref 135–145)
Total Bilirubin: 0.4 mg/dL (ref 0.3–1.2)
Total Protein: 7.2 g/dL (ref 6.5–8.1)

## 2019-03-13 LAB — CBC
HCT: 39.2 % (ref 36.0–46.0)
Hemoglobin: 11.7 g/dL — ABNORMAL LOW (ref 12.0–15.0)
MCH: 24.6 pg — ABNORMAL LOW (ref 26.0–34.0)
MCHC: 29.8 g/dL — ABNORMAL LOW (ref 30.0–36.0)
MCV: 82.5 fL (ref 80.0–100.0)
Platelets: 195 10*3/uL (ref 150–400)
RBC: 4.75 MIL/uL (ref 3.87–5.11)
RDW: 14.8 % (ref 11.5–15.5)
WBC: 8.1 10*3/uL (ref 4.0–10.5)
nRBC: 0 % (ref 0.0–0.2)

## 2019-03-13 LAB — SARS CORONAVIRUS 2 BY RT PCR (HOSPITAL ORDER, PERFORMED IN ~~LOC~~ HOSPITAL LAB): SARS Coronavirus 2: NEGATIVE

## 2019-03-13 LAB — POCT PREGNANCY, URINE: Preg Test, Ur: NEGATIVE

## 2019-03-13 SURGERY — DENTAL RESTORATION/EXTRACTIONS
Anesthesia: General | Site: Mouth

## 2019-03-13 MED ORDER — CEFAZOLIN SODIUM-DEXTROSE 2-4 GM/100ML-% IV SOLN
2.0000 g | INTRAVENOUS | Status: AC
  Administered 2019-03-13: 2 g via INTRAVENOUS
  Filled 2019-03-13: qty 100

## 2019-03-13 MED ORDER — LIDOCAINE-EPINEPHRINE 2 %-1:100000 IJ SOLN
INTRAMUSCULAR | Status: DC | PRN
  Administered 2019-03-13: 13 mL via INTRADERMAL

## 2019-03-13 MED ORDER — 0.9 % SODIUM CHLORIDE (POUR BTL) OPTIME
TOPICAL | Status: DC | PRN
  Administered 2019-03-13: 1000 mL

## 2019-03-13 MED ORDER — DEXAMETHASONE SODIUM PHOSPHATE 10 MG/ML IJ SOLN
INTRAMUSCULAR | Status: DC | PRN
  Administered 2019-03-13: 5 mg via INTRAVENOUS

## 2019-03-13 MED ORDER — MIDAZOLAM HCL 5 MG/5ML IJ SOLN
INTRAMUSCULAR | Status: DC | PRN
  Administered 2019-03-13: 2 mg via INTRAVENOUS

## 2019-03-13 MED ORDER — MIDAZOLAM HCL 2 MG/2ML IJ SOLN
INTRAMUSCULAR | Status: AC
  Filled 2019-03-13: qty 2

## 2019-03-13 MED ORDER — PROPOFOL 10 MG/ML IV BOLUS
INTRAVENOUS | Status: DC | PRN
  Administered 2019-03-13: 150 mg via INTRAVENOUS

## 2019-03-13 MED ORDER — ACETAMINOPHEN 500 MG PO TABS
1000.0000 mg | ORAL_TABLET | Freq: Once | ORAL | Status: AC
  Administered 2019-03-13: 08:00:00 1000 mg via ORAL
  Filled 2019-03-13: qty 2

## 2019-03-13 MED ORDER — SUCCINYLCHOLINE CHLORIDE 200 MG/10ML IV SOSY
PREFILLED_SYRINGE | INTRAVENOUS | Status: AC
  Filled 2019-03-13: qty 10

## 2019-03-13 MED ORDER — LIDOCAINE-EPINEPHRINE 2 %-1:100000 IJ SOLN
INTRAMUSCULAR | Status: AC
  Filled 2019-03-13: qty 1

## 2019-03-13 MED ORDER — SODIUM CHLORIDE 0.9 % IR SOLN
Status: DC | PRN
  Administered 2019-03-13: 1000 mL

## 2019-03-13 MED ORDER — LIDOCAINE 2% (20 MG/ML) 5 ML SYRINGE
INTRAMUSCULAR | Status: AC
  Filled 2019-03-13: qty 5

## 2019-03-13 MED ORDER — HYDROMORPHONE HCL 1 MG/ML IJ SOLN: 0.2500 mg | INTRAMUSCULAR | Status: DC | PRN

## 2019-03-13 MED ORDER — FENTANYL CITRATE (PF) 250 MCG/5ML IJ SOLN
INTRAMUSCULAR | Status: AC
  Filled 2019-03-13: qty 5

## 2019-03-13 MED ORDER — OXYCODONE-ACETAMINOPHEN 5-325 MG PO TABS: 1.0000 | ORAL_TABLET | ORAL | 0 refills | Status: DC | PRN

## 2019-03-13 MED ORDER — LACTATED RINGERS IV SOLN
INTRAVENOUS | Status: DC
  Administered 2019-03-13: 08:00:00 via INTRAVENOUS

## 2019-03-13 MED ORDER — PROPOFOL 10 MG/ML IV BOLUS
INTRAVENOUS | Status: AC
  Filled 2019-03-13: qty 20

## 2019-03-13 MED ORDER — SUGAMMADEX SODIUM 200 MG/2ML IV SOLN
INTRAVENOUS | Status: DC | PRN
  Administered 2019-03-13: 200 mg via INTRAVENOUS

## 2019-03-13 MED ORDER — OXYMETAZOLINE HCL 0.05 % NA SOLN
NASAL | Status: AC
  Filled 2019-03-13: qty 30

## 2019-03-13 MED ORDER — AMOXICILLIN 500 MG PO CAPS: 500.0000 mg | ORAL_CAPSULE | Freq: Three times a day (TID) | ORAL | 0 refills | Status: DC

## 2019-03-13 MED ORDER — ONDANSETRON HCL 4 MG/2ML IJ SOLN
INTRAMUSCULAR | Status: DC | PRN
  Administered 2019-03-13: 4 mg via INTRAVENOUS

## 2019-03-13 MED ORDER — FENTANYL CITRATE (PF) 250 MCG/5ML IJ SOLN
INTRAMUSCULAR | Status: DC | PRN
  Administered 2019-03-13 (×2): 25 ug via INTRAVENOUS
  Administered 2019-03-13: 50 ug via INTRAVENOUS
  Administered 2019-03-13: 25 ug via INTRAVENOUS
  Administered 2019-03-13: 100 ug via INTRAVENOUS
  Administered 2019-03-13: 25 ug via INTRAVENOUS

## 2019-03-13 MED ORDER — ONDANSETRON HCL 4 MG/2ML IJ SOLN
INTRAMUSCULAR | Status: AC
  Filled 2019-03-13: qty 2

## 2019-03-13 MED ORDER — LIDOCAINE 2% (20 MG/ML) 5 ML SYRINGE
INTRAMUSCULAR | Status: DC | PRN
  Administered 2019-03-13: 60 mg via INTRAVENOUS

## 2019-03-13 MED ORDER — SUCCINYLCHOLINE CHLORIDE 200 MG/10ML IV SOSY
PREFILLED_SYRINGE | INTRAVENOUS | Status: DC | PRN
  Administered 2019-03-13: 100 mg via INTRAVENOUS

## 2019-03-13 MED ORDER — ROCURONIUM BROMIDE 50 MG/5ML IV SOSY
PREFILLED_SYRINGE | INTRAVENOUS | Status: DC | PRN
  Administered 2019-03-13: 20 mg via INTRAVENOUS

## 2019-03-13 SURGICAL SUPPLY — 40 items
BLADE SURG 15 STRL LF DISP TIS (BLADE) ×1 IMPLANT
BLADE SURG 15 STRL SS (BLADE) ×2
BUR CROSS CUT FISSURE 1.6 (BURR) ×2 IMPLANT
BUR CROSS CUT FISSURE 1.6MM (BURR) ×1
BUR EGG ELITE 4.0 (BURR) ×2 IMPLANT
BUR EGG ELITE 4.0MM (BURR) ×1
CANISTER SUCT 3000ML PPV (MISCELLANEOUS) ×3 IMPLANT
COVER SURGICAL LIGHT HANDLE (MISCELLANEOUS) ×3 IMPLANT
COVER WAND RF STERILE (DRAPES) ×1 IMPLANT
DECANTER SPIKE VIAL GLASS SM (MISCELLANEOUS) ×3 IMPLANT
DRAPE U-SHAPE 76X120 STRL (DRAPES) ×3 IMPLANT
GAUZE PACKING FOLDED 2  STR (GAUZE/BANDAGES/DRESSINGS) ×2
GAUZE PACKING FOLDED 2 STR (GAUZE/BANDAGES/DRESSINGS) ×1 IMPLANT
GLOVE BIO SURGEON STRL SZ 6.5 (GLOVE) IMPLANT
GLOVE BIO SURGEON STRL SZ7 (GLOVE) ×2 IMPLANT
GLOVE BIO SURGEON STRL SZ7.5 (GLOVE) ×3 IMPLANT
GLOVE BIO SURGEONS STRL SZ 6.5 (GLOVE)
GLOVE BIOGEL PI IND STRL 6.5 (GLOVE) IMPLANT
GLOVE BIOGEL PI IND STRL 7.0 (GLOVE) IMPLANT
GLOVE BIOGEL PI INDICATOR 6.5 (GLOVE)
GLOVE BIOGEL PI INDICATOR 7.0 (GLOVE)
GOWN STRL REUS W/ TWL LRG LVL3 (GOWN DISPOSABLE) ×1 IMPLANT
GOWN STRL REUS W/ TWL XL LVL3 (GOWN DISPOSABLE) ×1 IMPLANT
GOWN STRL REUS W/TWL LRG LVL3 (GOWN DISPOSABLE) ×2
GOWN STRL REUS W/TWL XL LVL3 (GOWN DISPOSABLE) ×2
IV NS 1000ML (IV SOLUTION) ×2
IV NS 1000ML BAXH (IV SOLUTION) ×1 IMPLANT
KIT BASIN OR (CUSTOM PROCEDURE TRAY) ×3 IMPLANT
KIT TURNOVER KIT B (KITS) ×3 IMPLANT
NDL HYPO 25GX1X1/2 BEV (NEEDLE) ×2 IMPLANT
NEEDLE HYPO 25GX1X1/2 BEV (NEEDLE) ×6 IMPLANT
NS IRRIG 1000ML POUR BTL (IV SOLUTION) ×3 IMPLANT
PAD ARMBOARD 7.5X6 YLW CONV (MISCELLANEOUS) ×3 IMPLANT
SLEEVE IRRIGATION ELITE 7 (MISCELLANEOUS) ×3 IMPLANT
SPONGE SURGIFOAM ABS GEL 12-7 (HEMOSTASIS) IMPLANT
SUT CHROMIC 3 0 PS 2 (SUTURE) ×5 IMPLANT
SYR CONTROL 10ML LL (SYRINGE) ×5 IMPLANT
TRAY ENT MC OR (CUSTOM PROCEDURE TRAY) ×3 IMPLANT
TUBING IRRIGATION (MISCELLANEOUS) ×3 IMPLANT
YANKAUER SUCT BULB TIP NO VENT (SUCTIONS) ×3 IMPLANT

## 2019-03-13 NOTE — Transfer of Care (Signed)
Immediate Anesthesia Transfer of Care Note  Patient: [name]  Procedure(s) Performed: DENTAL RESTORATION/EXTRACTIONS (N/A Mouth)  Patient Location: PACU  Anesthesia Type:General  Level of Consciousness: awake, alert , oriented and patient cooperative  Airway & Oxygen Therapy: Patient Spontanous Breathing and Patient connected to face mask oxygen  Post-op Assessment: Report given to RN, Post -op Vital signs reviewed and stable and Patient moving all extremities  Post vital signs: Reviewed and stable  Last Vitals:  Vitals Value Taken Time  BP 107/49 03/13/19 1032  Temp    Pulse 86 03/13/19 1035  Resp 13 03/13/19 1035  SpO2 100 % 03/13/19 1035  Vitals shown include unvalidated device data.  Last Pain:  Vitals:   03/13/19 0815  TempSrc:   PainSc: 0-No pain      Patients Stated Pain Goal: 0 (59/56/38 [id number])  Complications: No apparent anesthesia complications

## 2019-03-13 NOTE — Op Note (Signed)
NAMEVERNEDA, [name]. MEDICAL RECORD [id number] ACCOUNT [id number] DATE OF BIRTH:[birth year] FACILITY: [facility] LOCATION: [facility] PHYSICIAN:Karlo Goeden M. Khalifa Knecht, DDS  OPERATIVE REPORT  DATE OF PROCEDURE:  03/13/2019  PREOPERATIVE DIAGNOSES:  Nonrestorable teeth secondary to dental caries numbers 4, 5, 6, 7, 8, 9, 12, 13, 14, 18, 19.  POSTOPERATIVE DIAGNOSES:  Nonrestorable teeth secondary to dental caries numbers 4, 5, 6, 7, 8, 9, 12, 13, 14, 18, 19.  PROCEDURE:  Extraction of teeth numbers 4, 5, 6, 7, 8, 9, 12, 13, 14, 18, 19.  SURGEON:  Diona Browner, DDS  ANESTHESIA:  General nasal intubation, Dr. Ola Spurr.  DESCRIPTION OF PROCEDURE:  The patient was taken to the operating room and placed on the table in supine position.  General anesthesia was administered intravenously, and a nasal endotracheal tube was placed and secured.  The eyes were protected, and the  patient was draped for surgery.  A timeout was performed.  The posterior pharynx was suctioned, and a throat pack was placed.  2% lidocaine with 1:100,000 epinephrine was infiltrated in the left inferior alveolar block and in buccal and palatal  infiltration in the maxilla around the teeth to be removed.  A total of 13 mL was utilized.  A bite block was placed on the right side of the mouth, and a sweetheart retractor was used to retract the tongue.  Then, a 15 blade was used to make an incision  in the gingival sulcus around teeth numbers 18 and 19.  The periosteum was reflected with a periosteal elevator.  Bone was removed from around these teeth, and the roots of tooth #19 were removed with a 301 elevator and a rongeur, tooth #19 being  removed.  Tooth #18 was encountered, and using dental forceps, the tooth was mobilized somewhat, but the crown fractured necessitating sectioning of the roots and removal of the bone around them.  Then, the roots were removed with a 301 elevator.  The  sockets were curetted, irrigated, and closed with  3-0 chromic.  Then, a 15 blade was used to make an incision beginning in the area of the upper second molar, carried forward to tooth #14 at the distal aspect, and then the incision was carried in the  gingival sulcus around tooth numbers 14, 13, 12 across the edentulous space on the alveolar crest to tooth #9 and then carried forward in the gingival sulcus to tooth numbers 8, 7, and 6.  This incision was done in both the buccal, gingival sulcus, and  the palatal sulcus.  Then, the periosteum was reflected to expose the teeth.  The teeth were elevated with a 301 elevator and removed from the mouth with the dental forceps.  The sockets were curetted.  The periosteum was reflected to expose the alveolar  crest, which was irregular in contour, and then alveoplasty was performed using an egg bur and bone file.  Then, the area was irrigated and closed with 3-0 chromic.  Then, the bite block and sweetheart retractor were repositioned to the other side of  the mouth.  A 15 blade was used to make an incision around teeth numbers 4, 5, and 6.  Buccally and palatally in the gingival sulcus, the periosteum was reflected.  The teeth were elevated and removed from the mouth with the dental forceps.  The sockets  were curetted.  The periosteum was reflected to expose the alveolar crest, which was then smoothed using the egg bur and bone file.  Then, the area was irrigated and closed  with 3-0 chromic.  Then, the oral cavity was irrigated and suctioned and the  throat pack was removed.  The patient was left in the care of anesthesia for extubation with plans for transportation to recovery room and discharged home through day surgery.  ESTIMATED BLOOD LOSS:  Minimal.  COMPLICATIONS:  None.  SPECIMENS:  None.  LN/NUANCE  D:03/13/2019 T:03/13/2019 JOB:[id number]

## 2019-03-13 NOTE — Anesthesia Procedure Notes (Addendum)
Procedure Name: Intubation Date/Time: 03/13/2019 9:53 AM Performed by: Myna Bright, CRNA Pre-anesthesia Checklist: Patient identified, Emergency Drugs available, Suction available and Patient being monitored Patient Re-evaluated:Patient Re-evaluated prior to induction Oxygen Delivery Method: Circle system utilized Preoxygenation: Pre-oxygenation with 100% oxygen Induction Type: IV induction Ventilation: Mask ventilation without difficulty Laryngoscope Size: Glidescope and 3 Nasal Tubes: Nasal prep performed and Right Tube size: 7.0 mm Number of attempts: 1 Placement Confirmation: positive ETCO2 and breath sounds checked- equal and bilateral Tube secured with: Tape Dental Injury: Teeth and Oropharynx as per pre-operative assessment  Comments: Glidecope used to assist in placing nasal ETT. Grade I view with Glide. Atraumatic nasal ETT placement.

## 2019-03-13 NOTE — H&P (Signed)
HISTORY AND PHYSICAL  [name] is a 28 y.o. female patient with CC: painful teeth  No diagnosis found.  Past Medical History:  Diagnosis Date  . Anxiety   . Bipolar disorder ([place])   . Dental caries   . Depression   . H/O wisdom tooth extraction   . Hepatitis B   . Hepatitis C    dx 02/2018  2B  . History of heroin use    clean since 10/2017  . Hx of appendectomy   . Post traumatic stress disorder (PTSD)   . Postpartum hemorrhage   . Pre-eclampsia    " back to normal after I had my daughter on 01/20/19"  . Sinus tachycardia   . Smoker     Current Facility-Administered Medications  Medication Dose Route Frequency Provider Last Rate Last Dose  . acetaminophen (TYLENOL) tablet 1,000 mg  1,000 mg Oral Once Roderic Palau, MD       Allergies  Allergen Reactions  . Haldol [Haloperidol Lactate] Other (See Comments)    Stiff neck    Active Problems:   * No active hospital problems. *  Vitals: Blood pressure 111/62, pulse 66, temperature 98.3 F (36.8 C), temperature source Oral, resp. rate 20, height 5' (1.524 m), weight 97.5 kg, SpO2 99 %, currently breastfeeding. Lab results: Results for orders placed or performed during the hospital encounter of 03/13/19 (from the past 24 hour(s))  SARS Coronavirus 2 [facility] order, Performed in [facility] hospital lab) Nasopharyngeal Nasopharyngeal Swab     Status: None   Collection Time: 03/13/19  6:21 AM   Specimen: Nasopharyngeal Swab  Result Value Ref Range   SARS Coronavirus 2 NEGATIVE NEGATIVE   Radiology Results: No results found. General appearance: alert, cooperative, no distress and morbidly obese Head: Normocephalic, without obvious abnormality, atraumatic Eyes: negative Nose: Nares normal. Septum midline. Mucosa normal. No drainage or sinus tenderness. Throat: multiple carious teeth. Pharynx clear. Neck: no adenopathy and supple, symmetrical, trachea midline Resp: clear to auscultation bilaterally Cardio:  regular rate and rhythm, S1, S2 normal, no murmur, click, rub or gallop  Assessment: Multiple non-restorable teeth. Morbid ob3esity  Plan: Multiple extrations with alveoloplasty. GA Day surgery.    Diona Browner 03/13/2019

## 2019-03-13 NOTE — Anesthesia Postprocedure Evaluation (Signed)
Anesthesia Post Note  Patient: LAIKLYN PILKENTON  Procedure(s) Performed: DENTAL RESTORATION/EXTRACTIONS (N/A Mouth)     Patient location during evaluation: PACU Anesthesia Type: General Level of consciousness: awake and alert Pain management: pain level controlled Vital Signs Assessment: post-procedure vital signs reviewed and stable Respiratory status: spontaneous breathing, nonlabored ventilation and respiratory function stable Cardiovascular status: blood pressure returned to baseline and stable Postop Assessment: no apparent nausea or vomiting Anesthetic complications: no    Last Vitals:  Vitals:   03/13/19 1047 03/13/19 1050  BP:  117/81  Pulse:  78  Resp: 16 16  Temp:  (!) 36.3 C  SpO2:  100%    Last Pain:  Vitals:   03/13/19 1050  TempSrc:   PainSc: 0-No pain                 Javaris Wigington,W. EDMOND

## 2019-03-13 NOTE — Op Note (Signed)
03/13/2019  10:25 AM  PATIENT:  [name]  28 y.o. female  PRE-OPERATIVE DIAGNOSIS:  NONRESTOREABLE teeth # 4, 5, 6, 7, 8, 9, 12, 13, 14, 18, 19   POST-OPERATIVE DIAGNOSIS:  SAME  PROCEDURE:  Procedure(s): EXTRACTION  teeth # 4, 5, 6, 7, 8, 9, 12, 13, 14, 18, 19  SURGEON:  Surgeon(s): Diona Browner, DDS  ANESTHESIA:   local and general  EBL:  minimal  DRAINS: none   SPECIMEN:  No Specimen  COUNTS:  YES  PLAN OF CARE: Discharge to home after PACU  PATIENT DISPOSITION:  PACU - hemodynamically stable.   PROCEDURE DETAILS: Dictation # [id number]  Gae Bon, DMD 03/13/2019 10:25 AM

## 2019-03-13 NOTE — Anesthesia Preprocedure Evaluation (Addendum)
Anesthesia Evaluation  Patient identified by MRN, date of birth, ID band Patient awake    Reviewed: Allergy & Precautions, H&P , NPO status , Patient's Chart, lab work & pertinent test results  Airway Mallampati: II  TM Distance: >3 FB Neck ROM: Full    Dental no notable dental hx. (+) Teeth Intact, Dental Advisory Given   Pulmonary Current Smoker and Patient abstained from smoking.,    Pulmonary exam normal breath sounds clear to auscultation       Cardiovascular hypertension,  Rhythm:Regular Rate:Normal     Neuro/Psych Anxiety Depression Bipolar Disorder negative neurological ROS     GI/Hepatic negative GI ROS, (+)     substance abuse  , Hepatitis -, C  Endo/Other  negative endocrine ROS  Renal/GU negative Renal ROS  negative genitourinary   Musculoskeletal   Abdominal   Peds  Hematology negative hematology ROS (+)   Anesthesia Other Findings   Reproductive/Obstetrics negative OB ROS                            Anesthesia Physical Anesthesia Plan  ASA: III  Anesthesia Plan: General   Post-op Pain Management:    Induction: Intravenous  PONV Risk Score and Plan: 3 and Ondansetron, Dexamethasone and Midazolam  Airway Management Planned: Nasal ETT and Video Laryngoscope Planned  Additional Equipment:   Intra-op Plan:   Post-operative Plan: Extubation in OR  Informed Consent: I have reviewed the patients History and Physical, chart, labs and discussed the procedure including the risks, benefits and alternatives for the proposed anesthesia with the patient or authorized representative who has indicated his/her understanding and acceptance.     Dental advisory given  Plan Discussed with: CRNA  Anesthesia Plan Comments:         Anesthesia Quick Evaluation

## 2019-03-14 ENCOUNTER — Encounter (HOSPITAL_COMMUNITY): Payer: Self-pay | Admitting: Oral Surgery

## 2019-03-24 ENCOUNTER — Encounter: Payer: Self-pay | Admitting: Student in an Organized Health Care Education/Training Program

## 2019-03-25 ENCOUNTER — Other Ambulatory Visit: Payer: Self-pay

## 2019-03-25 ENCOUNTER — Ambulatory Visit (INDEPENDENT_AMBULATORY_CARE_PROVIDER_SITE_OTHER): Payer: Medicare Other | Admitting: Internal Medicine

## 2019-03-25 DIAGNOSIS — F1120 Opioid dependence, uncomplicated: Secondary | ICD-10-CM

## 2019-03-25 DIAGNOSIS — F1190 Opioid use, unspecified, uncomplicated: Secondary | ICD-10-CM

## 2019-03-25 DIAGNOSIS — F1199 Opioid use, unspecified with unspecified opioid-induced disorder: Secondary | ICD-10-CM

## 2019-03-25 DIAGNOSIS — F17210 Nicotine dependence, cigarettes, uncomplicated: Secondary | ICD-10-CM | POA: Diagnosis not present

## 2019-03-25 MED ORDER — ZUBSOLV 5.7-1.4 MG SL SUBL: 1.0000 | SUBLINGUAL_TABLET | Freq: Three times a day (TID) | SUBLINGUAL | 0 refills | Status: DC

## 2019-03-25 NOTE — Assessment & Plan Note (Signed)
Continue Zubzolv 5.7mg  TID.   Telehealth follow up 1 month.

## 2019-03-25 NOTE — Progress Notes (Signed)
  [facility] Internal Medicine Residency Telephone Encounter Continuity Care Appointment  HPI:   This telephone encounter was created for [name] on 03/25/2019 for the following purpose/cc opioid use disorder.   Patient is a 28 year old person with opioid use disorder doing a telephone follow-up for management of medication assisted treatment.  She reports to me she is doing very well with ZUBSOLV.  She has been adherent to therapy with no relapses.  Notably she did recently have some dental surgery she was given a prescription of oxycodone to help with the pain.  She reports that she took 1 or 2 pills but did not seem to help the pain is much is ibuprofen and Tylenol and thus she did not take any more of the oxycodone.  She continues to breast-feed and has an infant at home.  She is mostly been staying homebound due to concern for the novel coronavirus.   Past Medical History:  Past Medical History:  Diagnosis Date  . Anxiety   . Bipolar disorder ([place])   . Dental caries   . Depression   . H/O wisdom tooth extraction   . Hepatitis B   . Hepatitis C    dx 02/2018  2B  . History of heroin use    clean since 10/2017  . Hx of appendectomy   . Post traumatic stress disorder (PTSD)   . Postpartum hemorrhage   . Pre-eclampsia    " back to normal after I had my daughter on 01/20/19"  . Sinus tachycardia   . Smoker      ROS:   No fever or chills   Assessment / Plan / Recommendations:   Please see A&P under problem oriented charting for assessment of the patient's acute and chronic medical conditions.   As always, pt is advised that if symptoms worsen or new symptoms arise, they should go to an urgent care facility or to to ER for further evaluation.   Consent and Medical Decision Making:   This is a telephone encounter between MINAL STULLER and Lucious Groves on 03/25/2019 for management of opioid use disorder. The visit was conducted with the patient located at home and Lucious Groves at [facility]. The patient's identity was confirmed using their DOB and current address. The patient has consented to being evaluated through a telephone encounter and understands the associated risks (an examination cannot be done and the patient may need to come in for an appointment) / benefits (allows the patient to remain at home, decreasing exposure to coronavirus). I personally spent 7 minutes on medical discussion.

## 2019-04-21 ENCOUNTER — Encounter: Payer: Self-pay | Admitting: Student in an Organized Health Care Education/Training Program

## 2019-04-21 ENCOUNTER — Other Ambulatory Visit: Payer: Self-pay | Admitting: *Deleted

## 2019-04-22 MED ORDER — ZUBSOLV 5.7-1.4 MG SL SUBL: 1.0000 | SUBLINGUAL_TABLET | Freq: Three times a day (TID) | SUBLINGUAL | 0 refills | Status: DC

## 2019-04-29 ENCOUNTER — Ambulatory Visit (INDEPENDENT_AMBULATORY_CARE_PROVIDER_SITE_OTHER): Payer: Medicare Other | Admitting: Internal Medicine

## 2019-04-29 ENCOUNTER — Other Ambulatory Visit: Payer: Self-pay

## 2019-04-29 DIAGNOSIS — F17210 Nicotine dependence, cigarettes, uncomplicated: Secondary | ICD-10-CM

## 2019-04-29 DIAGNOSIS — F1199 Opioid use, unspecified with unspecified opioid-induced disorder: Secondary | ICD-10-CM

## 2019-04-29 DIAGNOSIS — F1120 Opioid dependence, uncomplicated: Secondary | ICD-10-CM | POA: Diagnosis not present

## 2019-04-29 DIAGNOSIS — F1190 Opioid use, unspecified, uncomplicated: Secondary | ICD-10-CM

## 2019-04-29 MED ORDER — ZUBSOLV 5.7-1.4 MG SL SUBL: 1.0000 | SUBLINGUAL_TABLET | Freq: Three times a day (TID) | SUBLINGUAL | 0 refills | Status: DC

## 2019-04-29 NOTE — Progress Notes (Signed)
  [facility] Internal Medicine Residency Telephone Encounter Continuity Care Appointment  HPI:   This telephone encounter was created for [name] on 04/29/2019 for the following purpose:   [name] presents for follow up of opioid use disorder I have reviewed the prior induction visit, follow up visits, and telephone encounters relevant to opiate use disorder (OUD) treatment.   Current daily dose: Zubsolv 5.7mg  TID  Date of Induction: 03/05/18  Current follow up interval, in weeks: 4  The patient has been adherent with the buprenorphine for OUD contract.   Last UDS Result: Appropriate  [name] reports that she is doing well.  She has had no relapse.  Her baby is now 27 months old and they are having the normal issues associated with that age (sleep, feeding, etc).  Her older daughter is taking virtual classes and that has also been a struggle.  She has an up beat attitude, however, and feels supported at home.    Past Medical History:  Past Medical History:  Diagnosis Date  . Anxiety   . Bipolar disorder ([place])   . Dental caries   . Depression   . H/O wisdom tooth extraction   . Hepatitis B   . Hepatitis C    dx 02/2018  2B  . History of heroin use    clean since 10/2017  . Hx of appendectomy   . Post traumatic stress disorder (PTSD)   . Postpartum hemorrhage   . Pre-eclampsia    " back to normal after I had my daughter on 01/20/19"  . Sinus tachycardia   . Smoker       ROS:   Doing well, normal issues with a 60 month old at home.  No relapse, no withdrawal symptoms.    Assessment / Plan / Recommendations:   Please see A&P under problem oriented charting for assessment of the patient's acute and chronic medical conditions.   As always, pt is advised that if symptoms worsen or new symptoms arise, they should go to an urgent care facility or to to ER for further evaluation.   Consent and Medical Decision Making:   This is a telephone encounter between  [name] and Gilles Chiquito on 04/29/2019 for OUD follow up. The visit was conducted with the patient located at home and Gilles Chiquito at [facility]. The patient's identity was confirmed using their DOB and full name. The patient has consented to being evaluated through a telephone encounter and understands the associated risks (an examination cannot be done and the patient may need to come in for an appointment) / benefits (allows the patient to remain at home, decreasing exposure to coronavirus). I personally spent 10 minutes on medical discussion.

## 2019-04-29 NOTE — Assessment & Plan Note (Signed)
Reports that she is doing well.  No use of illicit substances, no issues with withdrawal.  She is still taking 3 films per day and breastfeeding.  Baby is 81 months old and doing well.  PDMP reviewed and appropriate.   Will plan to have her follow up in 4 weeks.  Will remain in telehealth given COVID pandemic and very young child (3 months).  Will need to do in person visit in next 1-3 visits if possible.   Plan 4 week follow up UDS at next in person visit Refill Zubsolv at current dose.

## 2019-04-29 NOTE — Patient Instructions (Signed)
Instructions given over the phone.  

## 2019-05-16 ENCOUNTER — Encounter: Payer: Self-pay | Admitting: Student in an Organized Health Care Education/Training Program

## 2019-05-20 ENCOUNTER — Other Ambulatory Visit: Payer: Self-pay

## 2019-05-20 ENCOUNTER — Ambulatory Visit (INDEPENDENT_AMBULATORY_CARE_PROVIDER_SITE_OTHER): Payer: Medicare Other | Admitting: Student in an Organized Health Care Education/Training Program

## 2019-05-20 VITALS — BP 118/70 | HR 88 | Temp 98.4°F | Wt 214.5 lb

## 2019-05-20 DIAGNOSIS — F1120 Opioid dependence, uncomplicated: Secondary | ICD-10-CM

## 2019-05-20 DIAGNOSIS — F17210 Nicotine dependence, cigarettes, uncomplicated: Secondary | ICD-10-CM | POA: Diagnosis not present

## 2019-05-20 DIAGNOSIS — F319 Bipolar disorder, unspecified: Secondary | ICD-10-CM | POA: Diagnosis not present

## 2019-05-20 DIAGNOSIS — F1199 Opioid use, unspecified with unspecified opioid-induced disorder: Secondary | ICD-10-CM

## 2019-05-20 DIAGNOSIS — F1190 Opioid use, unspecified, uncomplicated: Secondary | ICD-10-CM

## 2019-05-20 MED ORDER — ZUBSOLV 5.7-1.4 MG SL SUBL: 1.0000 | SUBLINGUAL_TABLET | Freq: Three times a day (TID) | SUBLINGUAL | 0 refills | Status: DC

## 2019-05-20 NOTE — Assessment & Plan Note (Signed)
Bipolar disorder currently stable.  She is off of her chronic medications, has discontinued Lamictal and Ativan due to pregnancy and has not restarted.  I encouraged her to follow-up with her psychiatrist at the [place].  She is going through a lot of stress right now and I anticipate eventually she will need to restart some of these medications.  She is still breast-feeding, I advised that she have this discussion with her psychiatrist about risks and benefits to these medications.

## 2019-05-20 NOTE — Progress Notes (Signed)
   Assessment and Plan:  See Encounters tab for problem-based medical decision making.   __________________________________________________________  HPI:   28 year old woman here for follow-up of severe opioid use disorder.  She is doing very well.  Has a 51-month-old baby at home.  Her postpartum course has been uncomplicated.  She continues to breast-feed, supplements with formula as she has pretty low production around 3 ounces per pump.  Baby is doing well, she was low weights but is eating well and gaining weight well.  She reports a safe living environment, no interpersonal violence.  Her mother is ill right now, recently had a suicide attempt and currently hospitalized in [place].  Patient reports that her mental health is okay she is under a lot of stress, but she reports being very busy, denies anxiety, denies depression, denies insomnia, denies suicidal ideation.  She reports her cravings are well controlled, good compliance with ZUBSOLV.  Denies any recent relapses.  __________________________________________________________  Problem List: Patient Active Problem List   Diagnosis Date Noted  . Chronic hepatitis C virus genotype 2 infection ([place]) 03/15/2018    Priority: High  . Opioid use disorder ([place]) 03/05/2018    Priority: High  . Bipolar affective disorder ([place]) 03/05/2018    Priority: Medium    Medications: Reconciled today in Epic __________________________________________________________  Physical Exam:  Vital Signs: Vitals:   05/20/19 1021  BP: 118/70  Pulse: 88  Weight: 214 lb 8 oz (97.3 kg)    Gen: Well appearing, NAD Psych: Normal affect, not anxious or depressed appearing Neuro: Alert, conversational, full strength in the upper and lower extremities and normal gait

## 2019-05-20 NOTE — Assessment & Plan Note (Signed)
Chronic severe opioid use disorder currently well controlled on ZUBSOLV 5.7 mg 3 times daily.  Cravings are well controlled, no recent relapses.  Under a lot of stress right now with her 28-month-old baby and family issues.  Plan is to continue with the ZUBSOLV, sent a 1 month supply to her pharmacy.  She has good access through insurance.  Follow-up with [place] in 1 month by telephone visit.

## 2019-05-23 LAB — TOXASSURE SELECT,+ANTIDEPR,UR

## 2019-06-15 ENCOUNTER — Encounter: Payer: Self-pay | Admitting: Student in an Organized Health Care Education/Training Program

## 2019-06-16 ENCOUNTER — Other Ambulatory Visit: Payer: Self-pay | Admitting: Internal Medicine

## 2019-06-16 MED ORDER — ZUBSOLV 5.7-1.4 MG SL SUBL: 1.0000 | SUBLINGUAL_TABLET | Freq: Three times a day (TID) | SUBLINGUAL | 0 refills | Status: DC

## 2019-06-16 NOTE — Telephone Encounter (Signed)
Needs a refill on Buprenorphine HCl-Naloxone HCl (ZUBSOLV) 5.7-1.4 MG SUBL [id number] [place], [place] - [address] AT [facility], [address] contact [phone], pt will be out of medicine before next appt

## 2019-06-24 ENCOUNTER — Other Ambulatory Visit: Payer: Self-pay

## 2019-06-24 ENCOUNTER — Ambulatory Visit (INDEPENDENT_AMBULATORY_CARE_PROVIDER_SITE_OTHER): Payer: Medicare Other | Admitting: Internal Medicine

## 2019-06-24 DIAGNOSIS — F1190 Opioid use, unspecified, uncomplicated: Secondary | ICD-10-CM

## 2019-06-24 DIAGNOSIS — F1120 Opioid dependence, uncomplicated: Secondary | ICD-10-CM | POA: Diagnosis present

## 2019-06-24 DIAGNOSIS — F17210 Nicotine dependence, cigarettes, uncomplicated: Secondary | ICD-10-CM | POA: Diagnosis not present

## 2019-06-24 DIAGNOSIS — R011 Cardiac murmur, unspecified: Secondary | ICD-10-CM

## 2019-06-24 DIAGNOSIS — F1199 Opioid use, unspecified with unspecified opioid-induced disorder: Secondary | ICD-10-CM

## 2019-06-24 MED ORDER — ZUBSOLV 5.7-1.4 MG SL SUBL: 1.0000 | SUBLINGUAL_TABLET | Freq: Three times a day (TID) | SUBLINGUAL | 0 refills | Status: DC

## 2019-06-24 NOTE — Progress Notes (Signed)
   06/24/2019  Lolita Cram presents for follow up of opioid use disorder I have reviewed the prior induction visit, follow up visits, and telephone encounters relevant to opiate use disorder (OUD) treatment.   Current daily dose: Zubsolv 5.7 mg three times daily  Date of Induction: 03/05/2018  Current follow up interval, in weeks: 4 weeks   The patient has been adherent with the buprenorphine for OUD contract.   Last UDS Result: 09/03/2018  HPI: [name] is a 28 year old female who presents today for continued treatment of her chronic opioid use disorder.she is continuing to do well on ZUBSOLV therapy.  She takes 2-1/2 to 3 tablets a day with control of her opioid cravings.  She feels she has been pretty overall happy lately despite the coronavirus pandemic she is enjoying time with her 26-month-old who is gaining weight well she reports to me her main concern is always supply of breastmilk and making sure that her baby is adequately fed.  Would like to continue breast-feeding until her baby's first birthday she is supplementing with formula given the baby was premature.  Exam:   Vitals:   06/24/19 0958  BP: 104/72  Pulse: 91  Temp: 98.4 F (36.9 C)  TempSrc: Oral  SpO2: 99%  Weight: 223 lb 9.6 oz (101.4 kg)  Height: 5' (1.524 m)   General: NAD CV: rrr, nl S4/H6, soft systolic murmur best heard at upper sternal borders  Pulm: CTAB   Assessment/Plan:  See Problem Based Charting in the Encounters Tab    Lucious Groves, DO  06/24/2019  3:19 PM

## 2019-06-24 NOTE — Assessment & Plan Note (Signed)
Continue medical therapy with ZUBSOLV may take up to 3 times daily.  Has had good compliance and adherence, her next refill is not due for another few weeks I will provide her another 1 month prescription and reevaluate in about 6 weeks with a telemedicine encounter.

## 2019-08-12 ENCOUNTER — Encounter: Payer: Self-pay | Admitting: Student in an Organized Health Care Education/Training Program

## 2019-08-13 ENCOUNTER — Other Ambulatory Visit: Payer: Self-pay | Admitting: Internal Medicine

## 2019-08-13 MED ORDER — ZUBSOLV 5.7-1.4 MG SL SUBL: 1.0000 | SUBLINGUAL_TABLET | Freq: Three times a day (TID) | SUBLINGUAL | 0 refills | Status: DC

## 2019-09-09 ENCOUNTER — Encounter: Payer: Self-pay | Admitting: Internal Medicine

## 2019-09-10 MED ORDER — ZUBSOLV 5.7-1.4 MG SL SUBL: 1.0000 | SUBLINGUAL_TABLET | Freq: Three times a day (TID) | SUBLINGUAL | 0 refills | Status: DC

## 2019-10-07 ENCOUNTER — Other Ambulatory Visit: Payer: Self-pay

## 2019-10-07 ENCOUNTER — Ambulatory Visit (INDEPENDENT_AMBULATORY_CARE_PROVIDER_SITE_OTHER): Payer: Medicare Other | Admitting: Internal Medicine

## 2019-10-07 VITALS — BP 102/64 | HR 86 | Temp 97.7°F | Ht 60.0 in | Wt 242.6 lb

## 2019-10-07 DIAGNOSIS — F1120 Opioid dependence, uncomplicated: Secondary | ICD-10-CM | POA: Diagnosis present

## 2019-10-07 DIAGNOSIS — F1199 Opioid use, unspecified with unspecified opioid-induced disorder: Secondary | ICD-10-CM

## 2019-10-07 DIAGNOSIS — F1190 Opioid use, unspecified, uncomplicated: Secondary | ICD-10-CM

## 2019-10-07 MED ORDER — ZUBSOLV 5.7-1.4 MG SL SUBL: 1.0000 | SUBLINGUAL_TABLET | Freq: Three times a day (TID) | SUBLINGUAL | 0 refills | Status: DC

## 2019-10-07 NOTE — Patient Instructions (Signed)
[name],  It was a pleasure to see you today. Please continue taking your medication as previously prescribed. Follow up with [place] again in 4 weeks for a telehealth appointment.  Thank you!  Dr. Antony Contras

## 2019-10-07 NOTE — Progress Notes (Signed)
   10/07/2019  [name] presents for follow up of opioid use disorder I have reviewed the prior induction visit, follow up visits, and telephone encounters relevant to opiate use disorder (OUD) treatment.   Current daily dose: Zubsolv 5.7 mg TID  Date of Induction: 03/05/2028  Current follow up interval, in weeks: 4 weeks  The patient has been adherent with the buprenorphine for OUD contract.   Last UDS Result: 05/20/19 Appropriate   HPI: Patient is a 29 yo F here for follow up of OUD. For the details of today's visit, please refer to the assessment and plan.   Exam:   Vitals:   10/07/19 0948  Weight: 242 lb 9.6 oz (110 kg)  Height: 5' (1.524 m)   Constitutional: NAD, appears comfortable Cardiovascular: RRR, no murmurs, rubs, or gallops.  Pulmonary/Chest: CTAB, no wheezes, rales, or rhonchi.  Psychiatric: Normal mood and affect   Assessment/Plan:  See Problem Based Charting in the Encounters Tab   Sydney Poll, MD  10/07/2019  9:49 AM

## 2019-10-07 NOTE — Assessment & Plan Note (Signed)
Patient is doing well on zubsolv TID. She denies cravings and relapse. She is still breastfeeding her 69 month old baby. Database reviewed, refill history appropriate. Last UDS appropriate as well. Sent Utox today and 1 month refill. Plan for telehealth follow up in 4 weeks.

## 2019-10-11 LAB — TOXASSURE SELECT,+ANTIDEPR,UR

## 2019-10-16 ENCOUNTER — Encounter: Payer: Self-pay | Admitting: Student in an Organized Health Care Education/Training Program

## 2019-10-16 DIAGNOSIS — Z713 Dietary counseling and surveillance: Secondary | ICD-10-CM | POA: Diagnosis not present

## 2019-10-17 DIAGNOSIS — R739 Hyperglycemia, unspecified: Secondary | ICD-10-CM | POA: Diagnosis not present

## 2019-10-17 DIAGNOSIS — R5382 Chronic fatigue, unspecified: Secondary | ICD-10-CM | POA: Diagnosis not present

## 2019-10-17 DIAGNOSIS — Z713 Dietary counseling and surveillance: Secondary | ICD-10-CM | POA: Diagnosis not present

## 2019-10-21 NOTE — Telephone Encounter (Signed)
Thank you :)

## 2019-10-26 DIAGNOSIS — R638 Other symptoms and signs concerning food and fluid intake: Secondary | ICD-10-CM | POA: Diagnosis not present

## 2019-10-29 ENCOUNTER — Other Ambulatory Visit: Payer: Self-pay

## 2019-10-29 ENCOUNTER — Encounter: Payer: Self-pay | Admitting: Internal Medicine

## 2019-10-29 ENCOUNTER — Ambulatory Visit (INDEPENDENT_AMBULATORY_CARE_PROVIDER_SITE_OTHER): Payer: Medicare Other | Admitting: Internal Medicine

## 2019-10-29 ENCOUNTER — Telehealth: Payer: Self-pay | Admitting: *Deleted

## 2019-10-29 VITALS — BP 126/80 | HR 96 | Temp 98.3°F | Ht 60.0 in | Wt 228.7 lb

## 2019-10-29 DIAGNOSIS — F1190 Opioid use, unspecified, uncomplicated: Secondary | ICD-10-CM

## 2019-10-29 DIAGNOSIS — Z6841 Body Mass Index (BMI) 40.0 and over, adult: Secondary | ICD-10-CM | POA: Diagnosis not present

## 2019-10-29 DIAGNOSIS — F17210 Nicotine dependence, cigarettes, uncomplicated: Secondary | ICD-10-CM | POA: Diagnosis not present

## 2019-10-29 DIAGNOSIS — F1199 Opioid use, unspecified with unspecified opioid-induced disorder: Secondary | ICD-10-CM | POA: Diagnosis not present

## 2019-10-29 MED ORDER — PHENTERMINE HCL 37.5 MG PO TABS: 37.5000 mg | ORAL_TABLET | Freq: Every day | ORAL | 2 refills | Status: DC

## 2019-10-29 NOTE — Assessment & Plan Note (Signed)
Her BMI is 44 today.  She has lost 2 pounds with low caloric diet and increased exercise.  We discussed multiple options today including intermittent fasting, behavioral counseling and multiple medications.  We discussed the nature of medications for weight loss and that she will need to continue her caloric restriction and exercise to help the medications work.  She is very motivated and sounds like she has the support of her family.  She would prefer not to be on an injectable medication given her history of IVDU.  She has tried Wellbutrin in the past and did not like the effects.  She is a smoker, but would prefer not to be on this medication.  She will be out of pocket for all medications at this time given her drug coverage.  Orlistat (Xenical) is about $600 over the counter.  With Rx, phentermine is $10 for 1 month of tablets.   Plan Phentermine 37.5mg  daily for 12 weeks Continue balanced low caloric diet Continue increased exercise and stationary bike Close monthly follow up Gave her a primer on intermittent fasting, she is not a breakfast eater normally, so this may work for her.   Check CMET and glucose today

## 2019-10-29 NOTE — Telephone Encounter (Signed)
Pt arrived late for her appt this am; being seen.

## 2019-10-29 NOTE — Progress Notes (Signed)
   Subjective:    Patient ID: [name], female    DOB: 31-Jan-1991, 31 y.o.   MRN: [id number]  CC: discuss weight loss  HPI  [name] is a 29 year old woman with PMH of obesity, OUD.   [name] presents today to get assistance with weight loss.  She is interested in medication assistance along with nutritional assistance.  [name] reports that she is at her heaviest and she is having more fatigue and SOB with exercise.  She would like to lose weight.  She has been tracking her steps, using an exercise bike for 30 min most days and walking in the park with her kids.  She has been utilizing a low caloric intake diet with calorie intake around 1300 kcal.  She has successfully lost 2 pounds in the last 2 weeks with this method.  She has utilized medications in the past including OTC Orlistat, Buproprion and possibly phentermine, but she can't remember.  She has struggled with her weight for a long time.  Her BMI today is 44.  She is on Zubsolv for OUD and doing well.     Review of Systems  Constitutional: Positive for activity change (increased). Negative for appetite change, fatigue and fever.       Decreased exercise tolerance.  Respiratory: Negative for cough, chest tightness and shortness of breath.   Cardiovascular: Negative for chest pain and leg swelling.  Neurological: Negative for dizziness and weakness.       Objective:   Physical Exam Vitals and nursing note reviewed.  Constitutional:      General: She is not in acute distress.    Appearance: Normal appearance. She is obese. She is not toxic-appearing.  HENT:     Head: Normocephalic and atraumatic.  Cardiovascular:     Rate and Rhythm: Normal rate and regular rhythm.  Pulmonary:     Effort: Pulmonary effort is normal. No respiratory distress.  Neurological:     Mental Status: She is alert and oriented to person, place, and time. Mental status is at baseline.  Psychiatric:        Mood and Affect: Mood normal.         Behavior: Behavior normal.    Glucose and CMET today.        Assessment & Plan:  Return for telehealth in 1 month and then in person in 2 months.

## 2019-10-29 NOTE — Assessment & Plan Note (Signed)
She is doing well, no complaints today.  Taking her Zubsolv.   Plan Continue Zubsolv.  No need for refill today.  PDMP reviewed and appropriate.

## 2019-10-29 NOTE — Patient Instructions (Signed)
[name] - -  You are doing GREAT on your weight loss with exercise and low calorie diet.    You can also try intermittent fasting to see if this fits with your lifestyle (more information attached).   We will also try phentermine 37.5mg  daily, take with a meal.  This can help increase weight loss.   Please set up a telehealth appointment with me in 1 month.

## 2019-10-29 NOTE — Telephone Encounter (Signed)
Pt did not come to her appt this am. Called pt to re-schedule, no answer; left message to call the office to re-schedule for telehealth appt per Dr Criselda Peaches or in-person appt.

## 2019-10-30 ENCOUNTER — Encounter: Payer: Self-pay | Admitting: Internal Medicine

## 2019-10-30 LAB — CMP14 + ANION GAP
ALT: 25 IU/L (ref 0–32)
AST: 30 IU/L (ref 0–40)
Albumin/Globulin Ratio: 1.5 (ref 1.2–2.2)
Albumin: 4.8 g/dL (ref 3.9–5.0)
Alkaline Phosphatase: 78 IU/L (ref 39–117)
Anion Gap: 16 mmol/L (ref 10.0–18.0)
BUN/Creatinine Ratio: 19 (ref 9–23)
BUN: 13 mg/dL (ref 6–20)
Bilirubin Total: 0.3 mg/dL (ref 0.0–1.2)
CO2: 20 mmol/L (ref 20–29)
Calcium: 9.8 mg/dL (ref 8.7–10.2)
Chloride: 105 mmol/L (ref 96–106)
Creatinine, Ser: 0.7 mg/dL (ref 0.57–1.00)
GFR calc Af Amer: 134 mL/min/{1.73_m2} (ref >59)
GFR calc non Af Amer: 117 mL/min/{1.73_m2} (ref >59)
Globulin, Total: 3.2 g/dL (ref 1.5–4.5)
Glucose: 84 mg/dL (ref 65–99)
Potassium: 4 mmol/L (ref 3.5–5.2)
Sodium: 141 mmol/L (ref 134–144)
Total Protein: 8 g/dL (ref 6.0–8.5)

## 2019-11-04 ENCOUNTER — Other Ambulatory Visit: Payer: Self-pay

## 2019-11-07 ENCOUNTER — Other Ambulatory Visit: Payer: Self-pay | Admitting: Internal Medicine

## 2019-11-07 DIAGNOSIS — F1190 Opioid use, unspecified, uncomplicated: Secondary | ICD-10-CM

## 2019-11-07 DIAGNOSIS — F1199 Opioid use, unspecified with unspecified opioid-induced disorder: Secondary | ICD-10-CM

## 2019-11-07 MED ORDER — ZUBSOLV 5.7-1.4 MG SL SUBL: 1.0000 | SUBLINGUAL_TABLET | Freq: Three times a day (TID) | SUBLINGUAL | 0 refills | Status: DC

## 2019-11-18 ENCOUNTER — Ambulatory Visit: Payer: Medicare Other | Admitting: Internal Medicine

## 2019-11-18 ENCOUNTER — Other Ambulatory Visit: Payer: Self-pay

## 2019-11-18 DIAGNOSIS — F1190 Opioid use, unspecified, uncomplicated: Secondary | ICD-10-CM

## 2019-11-18 DIAGNOSIS — F1199 Opioid use, unspecified with unspecified opioid-induced disorder: Secondary | ICD-10-CM

## 2019-11-18 NOTE — Progress Notes (Signed)
Attempted to call patient for telehealth visit, left VM for patient to call back.

## 2019-11-26 ENCOUNTER — Ambulatory Visit: Payer: Medicare Other | Admitting: Internal Medicine

## 2019-11-26 ENCOUNTER — Other Ambulatory Visit: Payer: Self-pay

## 2019-11-26 DIAGNOSIS — Z6841 Body Mass Index (BMI) 40.0 and over, adult: Secondary | ICD-10-CM

## 2019-11-26 NOTE — Progress Notes (Signed)
Attempted to call at 2:20 and at 3pm.  No answer.  Left voicemail.  Will attempt to reschedule.   Debe Coder, MD

## 2019-12-03 ENCOUNTER — Encounter: Payer: Self-pay | Admitting: *Deleted

## 2019-12-03 NOTE — Progress Notes (Signed)

## 2019-12-05 ENCOUNTER — Encounter: Payer: Self-pay | Admitting: Internal Medicine

## 2019-12-05 ENCOUNTER — Other Ambulatory Visit: Payer: Self-pay | Admitting: Internal Medicine

## 2019-12-05 DIAGNOSIS — F1190 Opioid use, unspecified, uncomplicated: Secondary | ICD-10-CM

## 2019-12-05 MED ORDER — ZUBSOLV 5.7-1.4 MG SL SUBL: 1.0000 | SUBLINGUAL_TABLET | Freq: Three times a day (TID) | SUBLINGUAL | 0 refills | Status: DC

## 2019-12-12 NOTE — Progress Notes (Signed)
Things That May Be Affecting Your Health:  Alcohol  Hearing loss  Pain    Depression  Home Safety  Sexual Health   Diabetes  Lack of physical activity  Stress   Difficulty with daily activities  Loneliness  Tiredness   Drug use  Medicines X Tobacco use   Falls  Motor Vehicle Safety X Weight   Food choices  Oral Health  Other    YOUR PERSONALIZED HEALTH PLAN : 1. Schedule your next subsequent Medicare Wellness visit in one year 2. Attend all of your regular appointments to address your medical issues 3. Complete the preventative screenings and services   Annual Wellness Visit   Medicare Covered Preventative Screenings and Services  Services & Screenings Men and Women Who How Often Need? Date of Last Service Action  Abdominal Aortic Aneurysm Adults with AAA risk factors Once   NA  Alcohol Misuse and Counseling All Adults Screening once a year if no alcohol misuse. Counseling up to 4 face to face sessions.     Bone Density Measurement  Adults at risk for osteoporosis Once every 2 yrs     Lipid Panel Z13.6 All adults without CV disease Once every 5 yrs Yes  May check  Colorectal Cancer   Stool sample or  Colonoscopy All adults 51 and older   Once every year  Every 10 years     Depression All Adults Once a year  Today   Diabetes Screening Blood glucose, post glucose load, or GTT Z13.1  All adults at risk  Pre-diabetics  Once per year  Twice per year Yes  May screen with a random glucose  Diabetes  Self-Management Training All adults Diabetics 10 hrs first year; 2 hours subsequent years. Requires Copay     Glaucoma  Diabetics  Family history of glaucoma  African Americans 50 yrs +  Hispanic Americans 65 yrs + Annually - requires coppay   Assess for risk  Hepatitis C Z72.89 or F19.20  High Risk for HCV  Born between 1945 and 1965  Annually  Once No    HIV Z11.4 All adults based on risk  Annually btw ages 77 & 2 regardless of risk  Annually > 65 yrs if at  increased risk Yes  Check blood work  Lung Cancer Screening Asymptomatic adults aged 30-77 with 30 pack yr history and current smoker OR quit within the last 15 yrs Annually Must have counseling and shared decision making documentation before first screen     Medical Nutrition Therapy Adults with   Diabetes  Renal disease  Kidney transplant within past 3 yrs 3 hours first year; 2 hours subsequent years     Obesity and Counseling All adults Screening once a year Counseling if BMI 30 or higher  Today She is on weight loss meds  Tobacco Use Counseling Adults who use tobacco  Up to 8 visits in one year Yes  Yes  Vaccines Z23  Hepatitis B  Influenza   Pneumonia  Adults   Once  Once every flu season  Two different vaccines separated by one year     Next Annual Wellness Visit People with Medicare Every year  Today     Services & Screenings Women Who How Often Need  Date of Last Service Action  Mammogram  Z12.31 Women over 40 One baseline ages 56-39. Annually ager 40 yrs+     Pap tests All women Annually if high risk. Every 2 yrs for normal risk women Yes  Yes,  due this year  Screening for cervical cancer with   Pap (Z01.419 nl or Z01.411abnl) &  HPV Z11.51 Women aged 80 to 64 Once every 5 yrs   Yes, due this year  Screening pelvic and breast exams All women Annually if high risk. Every 2 yrs for normal risk women     Sexually Transmitted Diseases  Chlamydia  Gonorrhea  Syphilis All at risk adults Annually for non pregnant females at increased risk         [place] Men Who How Ofter Need  Date of Last Service Action  Prostate Cancer - DRE & PSA Men over 50 Annually.  DRE might require a copay.     Sexually Transmitted Diseases  Syphilis All at risk adults Annually for men at increased risk

## 2019-12-23 ENCOUNTER — Encounter: Payer: Self-pay | Admitting: Internal Medicine

## 2019-12-23 ENCOUNTER — Other Ambulatory Visit: Payer: Self-pay

## 2019-12-23 ENCOUNTER — Ambulatory Visit (INDEPENDENT_AMBULATORY_CARE_PROVIDER_SITE_OTHER): Payer: Medicare Other | Admitting: Internal Medicine

## 2019-12-23 ENCOUNTER — Ambulatory Visit: Payer: Medicare Other | Admitting: Women's Health

## 2019-12-23 DIAGNOSIS — F1199 Opioid use, unspecified with unspecified opioid-induced disorder: Secondary | ICD-10-CM | POA: Diagnosis not present

## 2019-12-23 DIAGNOSIS — F1190 Opioid use, unspecified, uncomplicated: Secondary | ICD-10-CM

## 2019-12-23 MED ORDER — ZUBSOLV 5.7-1.4 MG SL SUBL: 1.0000 | SUBLINGUAL_TABLET | Freq: Three times a day (TID) | SUBLINGUAL | 0 refills | Status: DC

## 2019-12-23 NOTE — Assessment & Plan Note (Signed)
Unfortunate that she is having multiple social stressors at once.  However it is reassuring that she has not relapsed and she remains adherent to the ZUBSOLV that is controlling cravings.  I discussed with her that that is a good silver lining to the situation.  I discussed for her to reach out to [place] if she has relapses and needs additional support and wished her the best in her ongoing troubles. We will continue ZUBSOLV 5.7 mg 3 times daily.

## 2019-12-23 NOTE — Progress Notes (Signed)
  [facility] Internal Medicine Residency Telephone Encounter Continuity Care Appointment  HPI:   This telephone encounter was created for [name] on 12/23/2019 for the following purpose:   [name] presents for follow up of opioid use disorder I have reviewed the prior induction visit, follow up visits, and telephone encounters relevant to opiate use disorder (OUD) treatment.   Current daily dose: Zubsolv 5.7mg  TID  Date of Induction: 03/05/18  Current follow up interval, in weeks: 4  The patient has been adherent with the buprenorphine for OUD contract.   Last UDS Result: 10/07/19 Appropriate  [name] unfortunately was unable to make her in person visit today.  She did proactively reach out to our office and noted that she was unable to make it due to transportation issues.  We were able to salvage the visit with a telehealth communication.  She reports that she is currently going through a rough time.  Not only is she having car issues.  But her boyfriend and father of her child has gotten into trouble in legal issues after a confrontation with the police.  He is currently out on bail but she expects serious charges.  Throughout this she is noted increased anxiety but reports that she has been able to continue to be abstaining from illicit opioids.  She is continued to remain on ZUBSOLV with cravings controlled.   Past Medical History:  Past Medical History:  Diagnosis Date  . Anxiety   . Bipolar disorder (HCC)   . Dental caries   . Depression   . H/O wisdom tooth extraction   . Hepatitis B   . Hepatitis C    dx 02/2018  2B  . History of heroin use    clean since 10/2017  . Hx of appendectomy   . Post traumatic stress disorder (PTSD)   . Postpartum hemorrhage   . Pre-eclampsia    " back to normal after I had my daughter on 01/20/19"  . Sinus tachycardia   . Smoker      ROS:   Doing well, normal issues with a 68 month old at home.  No relapse, no withdrawal  symptoms.    Assessment / Plan / Recommendations:   Please see A&P under problem oriented charting for assessment of the patient's acute and chronic medical conditions.   As always, pt is advised that if symptoms worsen or new symptoms arise, they should go to an urgent care facility or to to ER for further evaluation.   Consent and Medical Decision Making:   This is a telephone encounter between [name] and Gust Rung on 12/23/2019 for OUD follow up. The visit was conducted with the patient located at home and Gust Rung at [facility]. The patient's identity was confirmed using their DOB and full name. The patient has consented to being evaluated through a telephone encounter and understands the associated risks (an examination cannot be done and the patient may need to come in for an appointment) / benefits (allows the patient to remain at home, decreasing exposure to coronavirus). I personally spent 11 minutes on medical discussion.

## 2020-01-06 ENCOUNTER — Encounter: Payer: Self-pay | Admitting: Internal Medicine

## 2020-01-06 DIAGNOSIS — F1190 Opioid use, unspecified, uncomplicated: Secondary | ICD-10-CM

## 2020-01-07 ENCOUNTER — Encounter: Payer: Self-pay | Admitting: Internal Medicine

## 2020-01-07 MED ORDER — ZUBSOLV 5.7-1.4 MG SL SUBL: 1.0000 | SUBLINGUAL_TABLET | Freq: Three times a day (TID) | SUBLINGUAL | 0 refills | Status: DC

## 2020-02-02 ENCOUNTER — Other Ambulatory Visit: Payer: Self-pay | Admitting: *Deleted

## 2020-02-02 DIAGNOSIS — F1190 Opioid use, unspecified, uncomplicated: Secondary | ICD-10-CM

## 2020-02-02 NOTE — Telephone Encounter (Signed)
Schedule for oud 7/13 at 100%, do you want her added or next available, which would be 7/27 because her ex partner is scheduled 7/20 and she does not want to run into him. Please advise

## 2020-02-03 MED ORDER — ZUBSOLV 5.7-1.4 MG SL SUBL: 1.0000 | SUBLINGUAL_TABLET | Freq: Three times a day (TID) | SUBLINGUAL | 0 refills | Status: DC

## 2020-02-03 NOTE — Telephone Encounter (Signed)
7/27 is fine.  Thanks!

## 2020-02-04 DIAGNOSIS — Z20822 Contact with and (suspected) exposure to covid-19: Secondary | ICD-10-CM | POA: Diagnosis not present

## 2020-02-06 ENCOUNTER — Encounter: Payer: Self-pay | Admitting: Student in an Organized Health Care Education/Training Program

## 2020-02-06 DIAGNOSIS — F1190 Opioid use, unspecified, uncomplicated: Secondary | ICD-10-CM

## 2020-02-08 ENCOUNTER — Telehealth: Payer: Medicare Other | Admitting: Physician Assistant

## 2020-02-08 DIAGNOSIS — U071 COVID-19: Secondary | ICD-10-CM | POA: Diagnosis not present

## 2020-02-08 MED ORDER — ONDANSETRON HCL 4 MG PO TABS: 4.0000 mg | ORAL_TABLET | Freq: Four times a day (QID) | ORAL | 0 refills | Status: DC

## 2020-02-08 MED ORDER — ALBUTEROL SULFATE HFA 108 (90 BASE) MCG/ACT IN AERS: 2.0000 | INHALATION_SPRAY | Freq: Four times a day (QID) | RESPIRATORY_TRACT | 0 refills | Status: DC | PRN

## 2020-02-08 MED ORDER — BENZONATATE 100 MG PO CAPS: 100.0000 mg | ORAL_CAPSULE | Freq: Two times a day (BID) | ORAL | 0 refills | Status: DC | PRN

## 2020-02-08 NOTE — Progress Notes (Signed)
E-Visit for Corona Virus Screening  Your current symptoms could be consistent with the coronavirus.    After reviewing your responses It sounds like you are having some wheezing with your cough. If you are having shortness of breath at rest, feeling dizzy, still having SOB after using the inhaler or extremely weak I would recommend being seen in person right away. I have attached a list of other concerning symptoms that would require in person evaluation, please read these below.I have sent in an inhaler and cough medication. Please read the attached information below, do not hesitate to follow up in person if you are not getting better or you develop any new or worsening signs or symptoms.      Many health care providers can now test patients at their office but not all are.  [facility] has multiple testing sites. For information on our COVID testing locations and hours go to [email or web address]  We are enrolling you in our MyChart Home Monitoring for COVID19 . Daily you will receive a questionnaire within the MyChart website. Our COVID 19 response team will be monitoring your responses daily.  Testing Information: The COVID-19 Community Testing sites will begin testing BY APPOINTMENT ONLY.  You can schedule online at [email or web address]  If you do not have access to a smart phone or computer you may call [phone] for an appointment.   Additional testing sites in the Community:  . For [facility] Testing sites in [facility]  [email or web address]  . For Pop-up testing sites in [place]  [email or web address]  . For Testing sites with regular hours [email or web address]  . For [facility] [email or web address]  . For [facility]  [email or web address]  . For [facility] testing in [place] [employer] [email or web address]  . For [facility] testing in [facility]   [email or web address]  For  more information about community testing call [phone]   Please quarantine yourself while awaiting your test results. Please stay home for a minimum of 10 days from the first day of illness with improving symptoms and you have had 24 hours of no fever (without the use of Tylenol (Acetaminophen) Motrin (Ibuprofen) or any fever reducing medication).  Also - Do not get tested prior to returning to work because once you have had a positive test the test can stay positive for more then a month in some cases.   You should wear a mask or cloth face covering over your nose and mouth if you must be around other people or animals, including pets (even at home). Try to stay at least 6 feet away from other people. This will protect the people around you.  Please continue good preventive care measures, including:  frequent hand-washing, avoid touching your face, cover coughs/sneezes, stay out of crowds and keep a 6 foot distance from others.  COVID-19 is a respiratory illness with symptoms that are similar to the flu. Symptoms are typically mild to moderate, but there have been cases of severe illness and death due to the virus.   The following symptoms may appear 2-14 days after exposure: . Fever . Cough . Shortness of breath or difficulty breathing . Chills . Repeated shaking with chills . Muscle pain . Headache . Sore throat . New loss of taste or smell . Fatigue . Congestion or runny nose . Nausea or vomiting . Diarrhea  Go to the nearest hospital ED for assessment if fever/cough/breathlessness are severe or illness seems  like a threat to life.  It is  vitally important that if you feel that you have an infection such as this virus or any other virus that you stay home and away from places where you may spread it to others.  You should avoid contact with people age 28 and older.   You can use medication such as A prescription cough medication called Tessalon Perles 100 mg. You may take 1-2 capsules every 8 hours as needed for cough and A prescription inhaler called Albuterol MDI 90 mcg /actuation 2 puffs every 4 hours as needed for shortness of breath, wheezing, cough  You may also take acetaminophen (Tylenol) as needed for fever.  Reduce your risk of any infection by using the same precautions used for avoiding the common cold or flu:  Marland Kitchen Wash your hands often with soap and warm water for at least 20 seconds.  If soap and water are not readily available, use an alcohol-based hand sanitizer with at least 60% alcohol.  . If coughing or sneezing, cover your mouth and nose by coughing or sneezing into the elbow areas of your shirt or coat, into a tissue or into your sleeve (not your hands). . Avoid shaking hands with others and consider head nods or verbal greetings only. . Avoid touching your eyes, nose, or mouth with unwashed hands.  . Avoid close contact with people who are sick. . Avoid places or events with large numbers of people in one location, like concerts or sporting events. . Carefully consider travel plans you have or are making. . If you are planning any travel outside or inside the [place], visit the CDC's Travelers' Health webpage for the latest health notices. . If you have some symptoms but not all symptoms, continue to monitor at home and seek medical attention if your symptoms worsen. . If you are having a medical emergency, call 911.  HOME CARE . Only take medications as instructed by your medical team. . Drink plenty of fluids and get plenty of rest. . A steam or ultrasonic humidifier can help if you  have congestion.   GET HELP RIGHT AWAY IF YOU HAVE EMERGENCY WARNING SIGNS** FOR COVID-19. If you or someone is showing any of these signs seek emergency medical care immediately. Call 911 or proceed to your closest emergency facility if: . You develop worsening high fever. . Trouble breathing . Bluish lips or face . Persistent pain or pressure in the chest . New confusion . Inability to wake or stay awake . You cough up blood. . Your symptoms become more severe  **This list is not all possible symptoms. Contact your medical provider for any symptoms that are sever or concerning to you.  MAKE SURE YOU   Understand these instructions.  Will watch your condition.  Will get help right away if you are not doing well or get worse.  Your e-visit answers were reviewed by a board certified advanced clinical practitioner to complete your personal care plan.  Depending on the condition, your plan could have included both over the counter or prescription medications.  If there is a problem please reply once you have received a response from your provider.  Your safety is important to [place].  If you have drug allergies check your prescription carefully.    You can use MyChart to ask questions about today's visit, request a non-urgent call back, or ask for a work or school excuse for 24 hours related to this e-Visit. If it has been greater than 24 hours you will need to  follow up with your provider, or enter a new e-Visit to address those concerns. You will get an e-mail in the next two days asking about your experience.  I hope that your e-visit has been valuable and will speed your recovery. Thank you for using e-visits.  Greater than 5 minutes, yet less than 10 minutes of time have been spent researching, coordinating, and implementing care for this patient today

## 2020-02-08 NOTE — Addendum Note (Signed)
Addended by: Curlene Dolphin, Jasey Cortez TODD on: 02/08/2020 11:19 AM   Modules accepted: Orders

## 2020-02-09 MED ORDER — ZUBSOLV 5.7-1.4 MG SL SUBL: 1.0000 | SUBLINGUAL_TABLET | Freq: Three times a day (TID) | SUBLINGUAL | 0 refills | Status: DC

## 2020-02-17 ENCOUNTER — Other Ambulatory Visit: Payer: Self-pay

## 2020-02-17 ENCOUNTER — Ambulatory Visit (INDEPENDENT_AMBULATORY_CARE_PROVIDER_SITE_OTHER): Payer: Medicare Other | Admitting: Internal Medicine

## 2020-02-17 VITALS — BP 132/78 | HR 112 | Temp 98.2°F | Ht 60.0 in | Wt 217.6 lb

## 2020-02-17 DIAGNOSIS — B182 Chronic viral hepatitis C: Secondary | ICD-10-CM | POA: Diagnosis not present

## 2020-02-17 DIAGNOSIS — F1190 Opioid use, unspecified, uncomplicated: Secondary | ICD-10-CM

## 2020-02-17 DIAGNOSIS — U071 COVID-19: Secondary | ICD-10-CM

## 2020-02-17 DIAGNOSIS — F1199 Opioid use, unspecified with unspecified opioid-induced disorder: Secondary | ICD-10-CM

## 2020-02-17 DIAGNOSIS — Z8616 Personal history of COVID-19: Secondary | ICD-10-CM | POA: Insufficient documentation

## 2020-02-17 MED ORDER — ZUBSOLV 5.7-1.4 MG SL SUBL: 1.0000 | SUBLINGUAL_TABLET | Freq: Three times a day (TID) | SUBLINGUAL | 0 refills | Status: DC

## 2020-02-17 NOTE — Assessment & Plan Note (Signed)
[name] is doing really well despite all her social stressors. She has moved into a safer living situation with her own apartment, car, and new job. She is in good spirits and has remained adherent to ZubSolv which is controlling her cravings. Prior UTox appropriate. UTox done today  Plan: Continue ZubSolv 5.7-1.4mg  tid

## 2020-02-17 NOTE — Assessment & Plan Note (Signed)
This was diagnosed in August 2019; however, due to pregnancy and breast feeding, was unable to initiate treatment. She is done breastfeeding at this time and would like to pursue treatment for her hep C at this time.   Plan:  HIV, HCV RNA quant and genotype CBC, CMP

## 2020-02-17 NOTE — Assessment & Plan Note (Signed)
BMI 42 (previously 44 at visit in April 2021). She has been in calorie deficit and reports incorporating more fruits and vegetables into her diet. Has also continued the phentermine 37.5mg  daily. She is down to 217 lbs today. She is encouraged for continued weight loss.  Plan:  Continue to monitor and encourage lifestyle modifications Continue phentermine 37.5mg  daily

## 2020-02-17 NOTE — Patient Instructions (Signed)
[name],   It was a pleasure seeing you in clinic. Today we discussed:   1. Hepatitis C - We are getting some lab work today and will follow up on this to start treatment.   2. Great job on the weight loss! Please continue the calorie deficit and incorporating increased protein intake, fruits and vegetables into your diet. Please let us know how we can help.   3. We will follow up in 4-6 weeks in OUD clinic via telehealth. Refills have been sent to your pharmacy.   If you have any questions or concerns, please call our clinic at [phone] between 9am-5pm and after hours call [phone] and ask for the internal medicine resident on call. If you feel you are having a medical emergency please call 911.   Thank you, we look forward to helping you remain healthy!

## 2020-02-17 NOTE — Assessment & Plan Note (Signed)
Patient recently tested positive for COVID with symptoms of headache, cough, congestion, body aches, and rash on bilateral inner thighs. She notes that symptoms are mostly resolving. She still endorses a productive cough and dyspnea on exertion but msot other symptoms are resolved. Denies any fevers.   Plan: Continue symptomatic management Discussed getting COVID 19 vaccine ~3 months

## 2020-02-17 NOTE — Progress Notes (Signed)
   02/17/2020  [name] presents for follow up of opioid use disorder I have reviewed the prior induction visit, follow up visits, and telephone encounters relevant to opiate use disorder (OUD) treatment.   Current daily dose: ZubSolv 7.5-1.4mg  tid  Date of Induction: 03/05/18  Current follow up interval, in weeks: 4 weeks   The patient has been adherent with the buprenorphine for OUD contract.   Last UDS Result: 02/17/2020  HPI: [name] is a 29 year old female presenting for follow up of her opioid use disorder. She had recent COVID infection and is recovering from that. No acute concerns at this visit. Please see problem based charting for complete assessment and plan.  Exam:   Vitals:   02/17/20 0947  BP: (!) 132/78  Pulse: (!) 112  Temp: 98.2 F (36.8 C)  TempSrc: Oral  SpO2: 96%  Weight: (!) 217 lb 9.6 oz (98.7 kg)  Height: 5' (1.524 m)    Physical Exam  Constitutional: Appears well-developed and well-nourished. No distress.  HENT: Normocephalic and atraumatic, EOMI, conjunctiva normal, moist mucous membranes Cardiovascular: Normal rate, regular rhythm, S1 and S2 present, no murmurs, rubs, gallops.  Distal pulses intact Respiratory: No respiratory distress, no accessory muscle use.  Effort is normal.  Lungs are clear to auscultation bilaterally. Neurological: Is alert and oriented x4, no apparent focal deficits noted. Skin: Warm and dry.  No recent lesions noted; multiple well-healed cuts on bilateral arms  Psychiatric: Normal mood and affect. Behavior is normal. Judgment and thought content normal.   Assessment/Plan:  See Problem Based Charting in the Encounters Tab     Eliezer Bottom, MD IMTS PGY-2 02/17/2020  10:33 AM

## 2020-02-18 ENCOUNTER — Encounter: Payer: Self-pay | Admitting: Internal Medicine

## 2020-02-18 ENCOUNTER — Other Ambulatory Visit: Payer: Self-pay | Admitting: Internal Medicine

## 2020-02-18 LAB — CBC WITH DIFFERENTIAL/PLATELET
Basophils Absolute: 0.1 10*3/uL (ref 0.0–0.2)
Basos: 1 %
EOS (ABSOLUTE): 0.1 10*3/uL (ref 0.0–0.4)
Eos: 2 %
Hematocrit: 42.1 % (ref 34.0–46.6)
Hemoglobin: 14 g/dL (ref 11.1–15.9)
Immature Grans (Abs): 0 10*3/uL (ref 0.0–0.1)
Immature Granulocytes: 0 %
Lymphocytes Absolute: 3.1 10*3/uL (ref 0.7–3.1)
Lymphs: 34 %
MCH: 27.3 pg (ref 26.6–33.0)
MCHC: 33.3 g/dL (ref 31.5–35.7)
MCV: 82 fL (ref 79–97)
Monocytes Absolute: 0.8 10*3/uL (ref 0.1–0.9)
Monocytes: 8 %
Neutrophils Absolute: 5.1 10*3/uL (ref 1.4–7.0)
Neutrophils: 55 %
Platelets: 224 10*3/uL (ref 150–450)
RBC: 5.12 x10E6/uL (ref 3.77–5.28)
RDW: 14.1 % (ref 11.7–15.4)
WBC: 9.2 10*3/uL (ref 3.4–10.8)

## 2020-02-18 LAB — CMP14 + ANION GAP
ALT: 24 IU/L (ref 0–32)
AST: 29 IU/L (ref 0–40)
Albumin/Globulin Ratio: 1.6 (ref 1.2–2.2)
Albumin: 4.6 g/dL (ref 3.9–5.0)
Alkaline Phosphatase: 76 IU/L (ref 48–121)
Anion Gap: 16 mmol/L (ref 10.0–18.0)
BUN/Creatinine Ratio: 22 (ref 9–23)
BUN: 12 mg/dL (ref 6–20)
Bilirubin Total: 0.2 mg/dL (ref 0.0–1.2)
CO2: 19 mmol/L — ABNORMAL LOW (ref 20–29)
Calcium: 9.2 mg/dL (ref 8.7–10.2)
Chloride: 107 mmol/L — ABNORMAL HIGH (ref 96–106)
Creatinine, Ser: 0.54 mg/dL — ABNORMAL LOW (ref 0.57–1.00)
GFR calc Af Amer: 146 mL/min/{1.73_m2} (ref >59)
GFR calc non Af Amer: 127 mL/min/{1.73_m2} (ref >59)
Globulin, Total: 2.8 g/dL (ref 1.5–4.5)
Glucose: 94 mg/dL (ref 65–99)
Potassium: 4.2 mmol/L (ref 3.5–5.2)
Sodium: 142 mmol/L (ref 134–144)
Total Protein: 7.4 g/dL (ref 6.0–8.5)

## 2020-02-18 MED ORDER — MAVYRET 100-40 MG PO TABS: 3.0000 | ORAL_TABLET | Freq: Every day | ORAL | 1 refills | Status: DC

## 2020-02-18 NOTE — Addendum Note (Signed)
Addended by: Gust Rung on: 02/18/2020 02:53 PM   Modules accepted: Orders
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
## 2020-02-19 LAB — HCV RNA QUANT
HCV log10: 5.436 log10 IU/mL
Hepatitis C Quantitation: 273000 IU/mL

## 2020-02-19 LAB — HEPATITIS C GENOTYPE

## 2020-02-20 LAB — TOXASSURE SELECT,+ANTIDEPR,UR

## 2020-03-01 ENCOUNTER — Telehealth: Payer: Self-pay | Admitting: *Deleted

## 2020-03-01 NOTE — Telephone Encounter (Addendum)
Information was sent through CoverMyMeds for PA for [facility].  Awaiting decision within 3-7 days.  [phone].  Angelina Ok, RN 03/01/2020 2:25 PM. Fax from [place] was denied for the Mavyret .  Suggestion to change to preferred formulary meds Epclusa tablets or Harvoni tablets.  Information to be placed in Dr. Neita Garnet box to consider change. Angelina Ok, RN 03/02/2020 9:43 AM.

## 2020-03-02 MED ORDER — SOFOSBUVIR-VELPATASVIR 400-100 MG PO TABS: 1.0000 | ORAL_TABLET | Freq: Every day | ORAL | 2 refills | Status: AC

## 2020-03-02 NOTE — Telephone Encounter (Signed)
Genotype is 2b, ok to change to epclusa 400-100 daily for 12 weeks, sent new Rx to pharmacy and updated patient.

## 2020-03-10 ENCOUNTER — Encounter: Payer: Self-pay | Admitting: Student in an Organized Health Care Education/Training Program

## 2020-03-10 NOTE — Telephone Encounter (Signed)
[name] apparently epclusa was also denied, can you look into this?

## 2020-03-11 ENCOUNTER — Telehealth: Payer: Self-pay | Admitting: *Deleted

## 2020-03-11 NOTE — Telephone Encounter (Signed)
Call to [facility] information was given to start PA for. Epclusa.  Awaiting determination within 24-72 hours.

## 2020-03-11 NOTE — Telephone Encounter (Addendum)
Information was called to [facility] for PA for Epclusa. Prescription for [facility]ita F[facility] [place].  Awaiting determination within 24-72 hours.  Angelina Ok, RN 03/11/2020 11:20 AM. Fax from [facility] 400mg /100mg  was approved thru 06/03/2020 .  13/05/2020, RN 03/15/2020 8:33 AM.

## 2020-03-15 NOTE — Telephone Encounter (Signed)
[name] has now been  approved until 06/03/2020.

## 2020-03-16 ENCOUNTER — Ambulatory Visit (INDEPENDENT_AMBULATORY_CARE_PROVIDER_SITE_OTHER): Payer: Medicare Other | Admitting: Internal Medicine

## 2020-03-16 ENCOUNTER — Other Ambulatory Visit: Payer: Self-pay

## 2020-03-16 DIAGNOSIS — F1199 Opioid use, unspecified with unspecified opioid-induced disorder: Secondary | ICD-10-CM | POA: Diagnosis not present

## 2020-03-16 DIAGNOSIS — B182 Chronic viral hepatitis C: Secondary | ICD-10-CM | POA: Diagnosis not present

## 2020-03-16 DIAGNOSIS — F1190 Opioid use, unspecified, uncomplicated: Secondary | ICD-10-CM

## 2020-03-16 MED ORDER — PHENTERMINE HCL 37.5 MG PO TABS: 37.5000 mg | ORAL_TABLET | Freq: Every day | ORAL | 2 refills | Status: DC

## 2020-03-16 MED ORDER — ZUBSOLV 5.7-1.4 MG SL SUBL: 1.0000 | SUBLINGUAL_TABLET | Freq: Three times a day (TID) | SUBLINGUAL | 0 refills | Status: DC

## 2020-03-16 NOTE — Assessment & Plan Note (Signed)
I discussed with her [name] is reportedly approved by prior authorization she will [facility] and see if this medication cannot be filled I went over dosing instructions she will take this medication for 12 weeks.  And we will need to repeat labs 12 weeks post completion to ensure sustained viral response.

## 2020-03-16 NOTE — Assessment & Plan Note (Signed)
Chronic and stable she continues to do well on ZUBSOLV.  She is usually taking 3 tablets daily she may try to go down to 2-1/2 tablets.
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
## 2020-03-16 NOTE — Assessment & Plan Note (Signed)
Reviewed Dr. Sabino Gasser previous notes.  I discussed with her that it would be fine to restart on phentermine.  We will have her back in about 5 weeks for in person visit to check blood pressure and weight.  Refill phentermine 37.5 mg daily with 2 refills.

## 2020-04-07 ENCOUNTER — Encounter: Payer: Self-pay | Admitting: Internal Medicine

## 2020-04-07 MED ORDER — ONDANSETRON HCL 8 MG PO TABS: 8.0000 mg | ORAL_TABLET | Freq: Three times a day (TID) | ORAL | 0 refills | Status: DC | PRN

## 2020-04-20 DIAGNOSIS — F4310 Post-traumatic stress disorder, unspecified: Secondary | ICD-10-CM | POA: Diagnosis not present

## 2020-04-22 ENCOUNTER — Encounter: Payer: Self-pay | Admitting: Internal Medicine

## 2020-04-24 DIAGNOSIS — F4310 Post-traumatic stress disorder, unspecified: Secondary | ICD-10-CM | POA: Diagnosis not present

## 2020-04-29 DIAGNOSIS — F4310 Post-traumatic stress disorder, unspecified: Secondary | ICD-10-CM | POA: Diagnosis not present

## 2020-05-03 DIAGNOSIS — F4310 Post-traumatic stress disorder, unspecified: Secondary | ICD-10-CM | POA: Diagnosis not present

## 2020-05-11 ENCOUNTER — Encounter: Payer: Self-pay | Admitting: Internal Medicine

## 2020-05-11 ENCOUNTER — Ambulatory Visit (INDEPENDENT_AMBULATORY_CARE_PROVIDER_SITE_OTHER): Payer: Medicare Other | Admitting: Internal Medicine

## 2020-05-11 DIAGNOSIS — F1190 Opioid use, unspecified, uncomplicated: Secondary | ICD-10-CM | POA: Diagnosis present

## 2020-05-11 NOTE — Patient Instructions (Signed)
[name] - -  It was great to see you!  Please keep taking your medications as you are and come see me in 2-3 months for a primary care visit and let me know when you need a refill for your buprenorphine.    Thank you!
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
## 2020-05-13 DIAGNOSIS — F4310 Post-traumatic stress disorder, unspecified: Secondary | ICD-10-CM | POA: Diagnosis not present

## 2020-05-13 NOTE — Assessment & Plan Note (Signed)
She is doing very well. She has no relapse, good control of cravings, no issues affording her medications.  PDMP is appropriate.  She just got a refill.   Plan Come back in 1-2 months, she is doing very well, I am her PCP Continue Zubsolv SL 5.7mg  TID UDS at next visit.

## 2020-05-17 DIAGNOSIS — F4310 Post-traumatic stress disorder, unspecified: Secondary | ICD-10-CM | POA: Diagnosis not present

## 2020-05-25 DIAGNOSIS — F4310 Post-traumatic stress disorder, unspecified: Secondary | ICD-10-CM | POA: Diagnosis not present

## 2020-06-01 DIAGNOSIS — F4310 Post-traumatic stress disorder, unspecified: Secondary | ICD-10-CM | POA: Diagnosis not present

## 2020-06-02 ENCOUNTER — Other Ambulatory Visit: Payer: Self-pay | Admitting: Internal Medicine

## 2020-06-02 ENCOUNTER — Encounter: Payer: Self-pay | Admitting: Internal Medicine

## 2020-06-02 DIAGNOSIS — F1190 Opioid use, unspecified, uncomplicated: Secondary | ICD-10-CM

## 2020-06-02 MED ORDER — ZUBSOLV 5.7-1.4 MG SL SUBL: 1.0000 | SUBLINGUAL_TABLET | Freq: Three times a day (TID) | SUBLINGUAL | 0 refills | Status: DC

## 2020-06-08 DIAGNOSIS — F4310 Post-traumatic stress disorder, unspecified: Secondary | ICD-10-CM | POA: Diagnosis not present

## 2020-06-08 DIAGNOSIS — Z20822 Contact with and (suspected) exposure to covid-19: Secondary | ICD-10-CM | POA: Diagnosis not present

## 2020-06-15 DIAGNOSIS — F4310 Post-traumatic stress disorder, unspecified: Secondary | ICD-10-CM | POA: Diagnosis not present

## 2020-06-23 ENCOUNTER — Other Ambulatory Visit: Payer: Self-pay | Admitting: *Deleted

## 2020-06-23 DIAGNOSIS — F4310 Post-traumatic stress disorder, unspecified: Secondary | ICD-10-CM | POA: Diagnosis not present

## 2020-06-23 NOTE — Telephone Encounter (Signed)
Got refill request for:  EPCLUSA 400/100MG  TAKE ONE TABLET BY MOUTH ONCE DAILY WITH OR WITHOUT FOOD. STORE AT ROOM TEMPERATURE  Do not see on medlist

## 2020-06-24 NOTE — Telephone Encounter (Signed)
Yes, Rolm Gala started her on that for chronic hep c.   Send my way

## 2020-06-25 ENCOUNTER — Encounter: Payer: Self-pay | Admitting: Internal Medicine

## 2020-06-29 NOTE — Telephone Encounter (Signed)
I do not think this refill is necessarily appropriate, she was supposed to take the Epculsa for 12 weeks then stop. I thought that I had prescribed 12 weeks worth, is this [facility]?  Myriam Jacobson could you reach out to the patient and find out?

## 2020-06-30 DIAGNOSIS — F4310 Post-traumatic stress disorder, unspecified: Secondary | ICD-10-CM | POA: Diagnosis not present

## 2020-06-30 NOTE — Telephone Encounter (Signed)
I agree.  I reached out to her via mychart and she has taken the complete Rx.  Will decline refill.

## 2020-07-01 NOTE — Telephone Encounter (Signed)
Yes, she picked up all 3 doses, [facility] [address] and pt picked up there. The request came automatically. I called [name] and she states yes she is on the 3rd bottle has appr 16 pills to go and will be finished from what she understood.

## 2020-07-02 ENCOUNTER — Other Ambulatory Visit: Payer: Self-pay

## 2020-07-02 ENCOUNTER — Encounter: Payer: Self-pay | Admitting: Internal Medicine

## 2020-07-02 ENCOUNTER — Ambulatory Visit (INDEPENDENT_AMBULATORY_CARE_PROVIDER_SITE_OTHER): Payer: Medicare Other | Admitting: Internal Medicine

## 2020-07-02 DIAGNOSIS — F4312 Post-traumatic stress disorder, chronic: Secondary | ICD-10-CM

## 2020-07-02 DIAGNOSIS — F1190 Opioid use, unspecified, uncomplicated: Secondary | ICD-10-CM | POA: Diagnosis present

## 2020-07-02 DIAGNOSIS — B182 Chronic viral hepatitis C: Secondary | ICD-10-CM | POA: Diagnosis not present

## 2020-07-02 DIAGNOSIS — Z8616 Personal history of COVID-19: Secondary | ICD-10-CM | POA: Diagnosis not present

## 2020-07-02 MED ORDER — ZUBSOLV 5.7-1.4 MG SL SUBL: 1.0000 | SUBLINGUAL_TABLET | Freq: Three times a day (TID) | SUBLINGUAL | 0 refills | Status: DC

## 2020-07-02 NOTE — Assessment & Plan Note (Signed)
She has been doing well with her weight.  She notes that the phentermine really helps her with that.  She would like to continue this medication.  She has no history of tachycardia or heart failure.  She has OUD, but has had no problem with phentermine in the past.  She is due to start a 12 week cycle of phentermine in January, she will call for a refill.

## 2020-07-02 NOTE — Assessment & Plan Note (Signed)
Taking Epclusa with good results.  She will be due for updated labs in about 2-4 weeks.  She will let me know through mychart so we can schedule.

## 2020-07-02 NOTE — Assessment & Plan Note (Signed)
She plans to get the COVID vaccine.  She got the initial shot, but then missed the follow up.  She will start the cycle again.

## 2020-07-02 NOTE — Progress Notes (Signed)
   Subjective:    Patient ID: [name], female    DOB: 1990/12/11, 29 y.o.   MRN: [id number]  CC: Follow up of OUD and chronic obesity  HPI   [name] is a 29 year old woman with OUD on Zubsolv, tobacco use, chronic HCV on Epclusa, morbid obesity and PTSD who presents for follow up.   She notes that she is doing well with her OUD.  She has no cravings or relapse.  We discussed her tobacco use and she is using about 1/2 to 3/4 pack per day.  She is interested in quitting, but cannot take varenicline as she has had a negative reaction in the past.  We discussed contacting the [place] quit line and she will give that a try.   She has been taking Epclusa for chronic HCV.  She has almost completed therapy and when completed she will get updated blood work.   She is working with a new therapist who has diagnosed her with PTSD and not bipolar.  She notes that she is getting trauma based therapy and is hopeful it will help.  She is s/p COVID, and would like to get the vaccine which she plans to schedule.   For her weight. She has been doing well, she is at 218 today and making progress.  She feels that she is having improvement with phentermine.  She is due to go back on this in January after 12 weeks off.     Review of Systems  Constitutional: Negative for activity change, appetite change and fatigue.  Respiratory: Negative for cough and shortness of breath.   Cardiovascular: Negative for chest pain.  Musculoskeletal: Negative for arthralgias and gait problem.  Psychiatric/Behavioral: Positive for dysphoric mood. Negative for self-injury and suicidal ideas.       Objective:   Physical Exam Vitals and nursing note reviewed.  Constitutional:      General: She is not in acute distress.    Appearance: She is obese. She is not toxic-appearing.  HENT:     Head: Normocephalic and atraumatic.  Cardiovascular:     Rate and Rhythm: Normal rate and regular rhythm.     Heart sounds: No murmur  heard.   Pulmonary:     Effort: Pulmonary effort is normal. No respiratory distress.  Musculoskeletal:        General: No swelling or tenderness.     Right lower leg: No edema.     Left lower leg: No edema.  Skin:    General: Skin is warm and dry.  Neurological:     Mental Status: She is alert and oriented to person, place, and time. Mental status is at baseline.  Psychiatric:        Mood and Affect: Mood normal.        Behavior: Behavior normal.     No blood work today.      Assessment & Plan:  F/U in 2 months with telehealth visit.

## 2020-07-02 NOTE — Assessment & Plan Note (Signed)
This is a chronic and controlled issue.  She has been clean for a prolonged period.  Zubsolv helps her to remain clean.   Plan Follow up telehealth in 2 months, in person in 2 months UDS at next visit PDMP reviewed and appropriate.  Last UDS appropriate.

## 2020-07-02 NOTE — Patient Instructions (Signed)
[name] - -  Thank you for coming in to see me today!  For your epclusa - once you have completed this therapy, please come in to get blood work.  Just let me know in mychart and we will get you scheduled.   You are due to start back your phentermine on Aug 16, 2020.  Please send me a note for a refill at that time.   Please start back your COVID 19 vaccine schedule.    Steps to Quit Smoking Smoking tobacco is the leading cause of preventable death. It can affect almost every organ in the body. Smoking puts you and people around you at risk for many serious, long-lasting (chronic) diseases. Quitting smoking can be hard, but it is one of the best things that you can do for your health. It is never too late to quit. How do I get ready to quit? When you decide to quit smoking, make a plan to help you succeed. Before you quit:  Pick a date to quit. Set a date within the next 2 weeks to give you time to prepare.  Write down the reasons why you are quitting. Keep this list in places where you will see it often.  Tell your family, friends, and co-workers that you are quitting. Their support is important.  Talk with your doctor about the choices that may help you quit.  Find out if your health insurance will pay for these treatments.  Know the people, places, things, and activities that make you want to smoke (triggers). Avoid them. What first steps can I take to quit smoking?  Throw away all cigarettes at home, at work, and in your car.  Throw away the things that you use when you smoke, such as ashtrays and lighters.  Clean your car. Make sure to empty the ashtray.  Clean your home, including curtains and carpets. What can I do to help me quit smoking? Talk with your doctor about taking medicines and seeing a counselor at the same time. You are more likely to succeed when you do both.  If you are pregnant or breastfeeding, talk with your doctor about counseling or other ways to  quit smoking. Do not take medicine to help you quit smoking unless your doctor tells you to do so. To quit smoking: Quit right away  Quit smoking totally, instead of slowly cutting back on how much you smoke over a period of time.  Go to counseling. You are more likely to quit if you go to counseling sessions regularly. Take medicine You may take medicines to help you quit. Some medicines need a prescription, and some you can buy over-the-counter. Some medicines may contain a drug called nicotine to replace the nicotine in cigarettes. Medicines may:  Help you to stop having the desire to smoke (cravings).  Help to stop the problems that come when you stop smoking (withdrawal symptoms). Your doctor may ask you to use:  Nicotine patches, gum, or lozenges.  Nicotine inhalers or sprays.  Non-nicotine medicine that is taken by mouth. Find resources Find resources and other ways to help you quit smoking and remain smoke-free after you quit. These resources are most helpful when you use them often. They include:  Online chats with a Veterinary surgeon.  Phone quitlines.  Printed Materials engineer.  Support groups or group counseling.  Text messaging programs.  Mobile phone apps. Use apps on your mobile phone or tablet that can help you stick to your quit plan. There  are many free apps for mobile phones and tablets as well as websites. Examples include Quit Guide from the [employer] and smokefree.gov  What things can I do to make it easier to quit?   Talk to your family and friends. Ask them to support and encourage you.  Call a phone quitline (1-800-QUIT-NOW), reach out to support groups, or work with a Veterinary surgeon.  Ask people who smoke to not smoke around you.  Avoid places that make you want to smoke, such as: ? Bars. ? Parties. ? Smoke-break areas at work.  Spend time with people who do not smoke.  Lower the stress in your life. Stress can make you want to smoke. Try these things to  help your stress: ? Getting regular exercise. ? Doing deep-breathing exercises. ? Doing yoga. ? Meditating. ? Doing a body scan. To do this, close your eyes, focus on one area of your body at a time from head to toe. Notice which parts of your body are tense. Try to relax the muscles in those areas. How will I feel when I quit smoking? Day 1 to 3 weeks Within the first 24 hours, you may start to have some problems that come from quitting tobacco. These problems are very bad 2-3 days after you quit, but they do not often last for more than 2-3 weeks. You may get these symptoms:  Mood swings.  Feeling restless, nervous, angry, or annoyed.  Trouble concentrating.  Dizziness.  Strong desire for high-sugar foods and nicotine.  Weight gain.  Trouble pooping (constipation).  Feeling like you may vomit (nausea).  Coughing or a sore throat.  Changes in how the medicines that you take for other issues work in your body.  Depression.  Trouble sleeping (insomnia). Week 3 and afterward After the first 2-3 weeks of quitting, you may start to notice more positive results, such as:  Better sense of smell and taste.  Less coughing and sore throat.  Slower heart rate.  Lower blood pressure.  Clearer skin.  Better breathing.  Fewer sick days. Quitting smoking can be hard. Do not give up if you fail the first time. Some people need to try a few times before they succeed. Do your best to stick to your quit plan, and talk with your doctor if you have any questions or concerns. Summary  Smoking tobacco is the leading cause of preventable death. Quitting smoking can be hard, but it is one of the best things that you can do for your health.  When you decide to quit smoking, make a plan to help you succeed.  Quit smoking right away, not slowly over a period of time.  When you start quitting, seek help from your doctor, family, or friends. This information is not intended to replace  advice given to you by your health care provider. Make sure you discuss any questions you have with your health care provider. Document Revised: 04/04/2019 Document Reviewed: 09/28/2018 Elsevier Patient Education  2020 [employer].

## 2020-07-02 NOTE — Assessment & Plan Note (Signed)
She is working with a provider in trauma informed care.  This counselor feels that her diagnosis was incorrect and she has not had bipolar disorder but PTSD.  She is hopeful for improvement using this method.  Continue to monitor.

## 2020-07-12 DIAGNOSIS — F4310 Post-traumatic stress disorder, unspecified: Secondary | ICD-10-CM | POA: Diagnosis not present

## 2020-07-14 DIAGNOSIS — F4310 Post-traumatic stress disorder, unspecified: Secondary | ICD-10-CM | POA: Diagnosis not present

## 2020-07-29 DIAGNOSIS — F4310 Post-traumatic stress disorder, unspecified: Secondary | ICD-10-CM | POA: Diagnosis not present

## 2020-08-05 ENCOUNTER — Encounter: Payer: Self-pay | Admitting: Internal Medicine

## 2020-08-05 DIAGNOSIS — F1190 Opioid use, unspecified, uncomplicated: Secondary | ICD-10-CM

## 2020-08-10 MED ORDER — ZUBSOLV 5.7-1.4 MG SL SUBL: 1.0000 | SUBLINGUAL_TABLET | Freq: Three times a day (TID) | SUBLINGUAL | 0 refills | Status: DC

## 2020-08-19 ENCOUNTER — Encounter: Payer: Self-pay | Admitting: Internal Medicine

## 2020-08-19 MED ORDER — PHENTERMINE HCL 37.5 MG PO TABS
37.5000 mg | ORAL_TABLET | Freq: Every day | ORAL | 2 refills | Status: DC
Start: 2020-08-19 — End: 2021-10-11

## 2020-09-03 ENCOUNTER — Telehealth: Payer: Self-pay | Admitting: *Deleted

## 2020-09-03 ENCOUNTER — Encounter: Payer: Medicare Other | Admitting: Internal Medicine

## 2020-09-03 ENCOUNTER — Encounter: Payer: Self-pay | Admitting: Internal Medicine

## 2020-09-03 NOTE — Telephone Encounter (Signed)
Pt did not come to her appt this am; called pt - no answer; left message on self identified vm to call the office to re-schedule.

## 2020-10-05 ENCOUNTER — Encounter: Payer: Self-pay | Admitting: Internal Medicine

## 2020-10-05 ENCOUNTER — Other Ambulatory Visit: Payer: Self-pay | Admitting: Student in an Organized Health Care Education/Training Program

## 2020-10-05 DIAGNOSIS — F1190 Opioid use, unspecified, uncomplicated: Secondary | ICD-10-CM

## 2020-10-07 ENCOUNTER — Telehealth: Payer: Self-pay

## 2020-10-07 MED ORDER — ZUBSOLV 5.7-1.4 MG SL SUBL: 1.0000 | SUBLINGUAL_TABLET | Freq: Three times a day (TID) | SUBLINGUAL | 0 refills | Status: DC

## 2020-10-07 NOTE — Telephone Encounter (Signed)
Pt called / informed of suboxone refill.

## 2020-10-07 NOTE — Telephone Encounter (Signed)
-----   Message from Inez Catalina, MD sent at 10/07/2020 10:58 AM EDT ----- Can someone call Ms. Schexnider and let her know that her medication was filled.   Thank you!

## 2020-10-07 NOTE — Telephone Encounter (Signed)
Inez Catalina, MD  P Imp Triage Nurse Pool; Hassan Buckler, RN Can someone call Ms. Kenealy and let her know that her medication was filled.   Thank you!    Patient called and notified of above. SChaplin, RN,BSN

## 2020-11-09 ENCOUNTER — Other Ambulatory Visit: Payer: Self-pay | Admitting: Internal Medicine

## 2020-11-09 DIAGNOSIS — F1190 Opioid use, unspecified, uncomplicated: Secondary | ICD-10-CM

## 2020-11-09 MED ORDER — ZUBSOLV 5.7-1.4 MG SL SUBL: 1.0000 | SUBLINGUAL_TABLET | Freq: Three times a day (TID) | SUBLINGUAL | 0 refills | Status: DC

## 2021-01-13 ENCOUNTER — Encounter: Payer: Self-pay | Admitting: Internal Medicine

## 2021-01-14 ENCOUNTER — Other Ambulatory Visit: Payer: Self-pay | Admitting: Student in an Organized Health Care Education/Training Program

## 2021-01-14 ENCOUNTER — Encounter (INDEPENDENT_AMBULATORY_CARE_PROVIDER_SITE_OTHER): Payer: Self-pay

## 2021-01-14 DIAGNOSIS — F1190 Opioid use, unspecified, uncomplicated: Secondary | ICD-10-CM

## 2021-01-17 ENCOUNTER — Encounter: Payer: Self-pay | Admitting: Internal Medicine

## 2021-01-17 MED ORDER — ZUBSOLV 5.7-1.4 MG SL SUBL: 0.5000 | SUBLINGUAL_TABLET | Freq: Every day | SUBLINGUAL | 0 refills | Status: DC

## 2021-01-18 DIAGNOSIS — Z20822 Contact with and (suspected) exposure to covid-19: Secondary | ICD-10-CM | POA: Diagnosis not present

## 2021-01-18 DIAGNOSIS — Z03818 Encounter for observation for suspected exposure to other biological agents ruled out: Secondary | ICD-10-CM | POA: Diagnosis not present

## 2021-01-18 DIAGNOSIS — J00 Acute nasopharyngitis [common cold]: Secondary | ICD-10-CM | POA: Diagnosis not present

## 2021-01-18 DIAGNOSIS — H66003 Acute suppurative otitis media without spontaneous rupture of ear drum, bilateral: Secondary | ICD-10-CM | POA: Diagnosis not present

## 2021-01-25 ENCOUNTER — Encounter: Payer: Self-pay | Admitting: *Deleted

## 2021-02-14 ENCOUNTER — Other Ambulatory Visit: Payer: Self-pay | Admitting: Internal Medicine

## 2021-02-14 DIAGNOSIS — F1190 Opioid use, unspecified, uncomplicated: Secondary | ICD-10-CM

## 2021-02-14 MED ORDER — ZUBSOLV 5.7-1.4 MG SL SUBL: 0.5000 | SUBLINGUAL_TABLET | Freq: Every day | SUBLINGUAL | 0 refills | Status: DC

## 2021-02-22 ENCOUNTER — Telehealth (INDEPENDENT_AMBULATORY_CARE_PROVIDER_SITE_OTHER): Payer: Medicare Other | Admitting: Student

## 2021-02-22 DIAGNOSIS — Z5329 Procedure and treatment not carried out because of patient's decision for other reasons: Secondary | ICD-10-CM

## 2021-02-22 NOTE — Progress Notes (Signed)
Patient was scheduled for a televisit to follow-up how she was doing transitioning off of buprenorphine.  Patient was called x2 without answer.  In addition attempted to reach patient via MyChart.

## 2021-03-08 ENCOUNTER — Ambulatory Visit (INDEPENDENT_AMBULATORY_CARE_PROVIDER_SITE_OTHER): Payer: Medicare Other | Admitting: Internal Medicine

## 2021-03-08 ENCOUNTER — Encounter: Payer: Self-pay | Admitting: Internal Medicine

## 2021-03-08 ENCOUNTER — Telehealth: Payer: Self-pay

## 2021-03-08 ENCOUNTER — Other Ambulatory Visit: Payer: Self-pay

## 2021-03-08 VITALS — BP 129/90 | HR 93 | Temp 98.2°F | Resp 24 | Ht 60.0 in | Wt 205.7 lb

## 2021-03-08 DIAGNOSIS — J069 Acute upper respiratory infection, unspecified: Secondary | ICD-10-CM

## 2021-03-08 DIAGNOSIS — F1190 Opioid use, unspecified, uncomplicated: Secondary | ICD-10-CM

## 2021-03-08 MED ORDER — ZUBSOLV 5.7-1.4 MG SL SUBL: 0.5000 | SUBLINGUAL_TABLET | Freq: Every day | SUBLINGUAL | 0 refills | Status: DC

## 2021-03-08 MED ORDER — ZUBSOLV 5.7-1.4 MG SL SUBL
1.0000 | SUBLINGUAL_TABLET | Freq: Every day | SUBLINGUAL | 0 refills | Status: DC
Start: 2021-03-08 — End: 2021-04-05

## 2021-03-08 NOTE — Patient Instructions (Signed)
Thank you, Ms.Iva Boop for allowing [place] to provide your care today. Today we discussed opioid use disorder and upper respiratory infection symptoms.    Labs Ordered:  Lab Orders         Novel Coronavirus, NAA ([facility])         ToxAssure Select,+Antidepr,UR      Tests Ordered:  none  Referrals Ordered:  Referral Orders  No referral(s) requested today     Medication Changes:  Medications Discontinued During This Encounter  Medication Reason   Buprenorphine HCl-Naloxone HCl (ZUBSOLV) 5.7-1.4 MG SUBL Reorder     Meds ordered this encounter  Medications   Buprenorphine HCl-Naloxone HCl (ZUBSOLV) 5.7-1.4 MG SUBL    Sig: Place 0.5 tablets under the tongue daily.    Dispense:  15 tablet    Refill:  0    NADEAN:[id number], ok to fill 03/15/21     Health Maintenance Screening: There are no preventive care reminders to display for this patient.   Instructions: Please try to isolate form others when possible. If you need to be in public, then place wear a well fitting mask.   Follow up:  1 month    Remember: If you have any questions or concerns, call our clinic at [phone] or after hours call [phone] and ask for the internal medicine resident on call.  Dellia Cloud, D.O. [facility]
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
## 2021-03-08 NOTE — Progress Notes (Signed)
CC: OUD  HPI:  [name] is a 30 y.o. female with a past medical history stated below and presents today for OUD. Please see problem based assessment and plan for additional details.  Past Medical History:  Diagnosis Date   Anxiety    Bipolar disorder (HCC)    Dental caries    Depression    H/O wisdom tooth extraction    Hepatitis B    Hepatitis C    dx 02/2018  2B   History of heroin use    clean since 10/2017   Hx of appendectomy    Post traumatic stress disorder (PTSD)    Postpartum hemorrhage    Pre-eclampsia    " back to normal after I had my daughter on 01/20/19"   Sinus tachycardia    Smoker     Current Outpatient Medications on File Prior to Visit  Medication Sig Dispense Refill   Buprenorphine HCl-Naloxone HCl (ZUBSOLV) 5.7-1.4 MG SUBL Place 0.5 tablets under the tongue daily. 15 tablet 0   ibuprofen (ADVIL) 800 MG tablet Take 1 tablet (800 mg total) by mouth 3 (three) times daily. (Patient taking differently: Take 800 mg by mouth as needed for mild pain or moderate pain. ) 30 tablet 0   phentermine (ADIPEX-P) 37.5 MG tablet Take 1 tablet (37.5 mg total) by mouth daily before breakfast. 30 tablet 2   Prenatal Vit-Fe Fumarate-FA (MULTIVITAMIN-PRENATAL) 27-0.8 MG TABS tablet Take 1 tablet by mouth daily at 12 noon. 90 tablet 3   No current facility-administered medications on file prior to visit.    Family History  Problem Relation Age of Onset   Cancer Paternal Grandfather    Heart attack Paternal Grandfather    Suicidality Paternal Grandmother    Anxiety disorder Maternal Grandmother    Hypertension Maternal Grandmother    Heart attack Maternal Grandfather    Cancer Father    Congenital heart disease Father    Hypertension Mother    Down syndrome Cousin    Autism Brother     Social History   Socioeconomic History   Marital status: Single    Spouse name: Not on file   Number of children: 1   Years of education: 13   Highest education level:  12th grade  Occupational History   Occupation: homemaker  Tobacco Use   Smoking status: Every Day    Packs/day: 0.50    Years: 10.00    Pack years: 5.00    Types: Cigarettes   Smokeless tobacco: Never   Tobacco comments:    .5 PPD  Vaping Use   Vaping Use: Former   Devices: 07/24/2012  Substance and Sexual Activity   Alcohol use: No   Drug use: No    Comment: clean since 10/2017   Sexual activity: Not Currently    Birth control/protection: None    Comment: approx [redacted] wks gestation per patient  Other Topics Concern   Not on file  Social History Narrative   Not on file   Social Determinants of Health   Financial Resource Strain: Not on file  Food Insecurity: Not on file  Transportation Needs: Not on file  Physical Activity: Not on file  Stress: Not on file  Social Connections: Not on file  Intimate Partner Violence: Not on file    Review of Systems: ROS negative except for what is noted on the assessment and plan.  There were no vitals filed for this visit.  Physical Exam: Gen: A&O x3 and  in no apparent distress, well appearing and nourished. HEENT: Head - normocephalic, atraumatic. Eye -  visual acuity grossly intact, conjunctiva clear, sclera non-icteric, EOM intact. Mouth - No obvious caries or periodontal disease. Neck: no obvious masses or nodules, AROM intact. CV: RRR, no murmurs, rubs, or gallops. S1/S2 presents  Resp: Clear to ascultation bilaterally  Abd: BS (+) x4, soft, non-tender, without obvious hepatosplenomegaly or masses MSK: Grossly normal AROM and strength x4 extremities. Skin: good skin turgor, no rashes, unusual bruising, or prominent lesions.  Neuro: No focal deficits, grossly normal sensation and coordination.  Psych: Oriented x3 and responding appropriately. Intact recent and remote memory, normal mood, judgement, affect , and insight.    Assessment & Plan:   See Encounters Tab for problem based charting.  Patient discussed with Dr.  Jaynie Crumble, D.O. [facility], PGY-3 Pager: [phone], Phone: [phone] Date 03/08/2021 Time 10:55 AM   Zubsolv 5.7-1.4 mg (#15) on 02/15/2021 Transitioning off of burprenorphine
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
## 2021-03-08 NOTE — Telephone Encounter (Signed)
Ok. I misunderstood. I will send a new rx for 1 tablet daily, its hard for a person to reliably cut a tab into 3/4 pieces. She should be able to fill that prescription now because it is a change in dose.

## 2021-03-09 ENCOUNTER — Encounter: Payer: Self-pay | Admitting: Internal Medicine

## 2021-03-09 DIAGNOSIS — J069 Acute upper respiratory infection, unspecified: Secondary | ICD-10-CM | POA: Insufficient documentation

## 2021-03-09 LAB — SARS-COV-2, NAA 2 DAY TAT

## 2021-03-09 LAB — NOVEL CORONAVIRUS, NAA: SARS-CoV-2, NAA: DETECTED — AB

## 2021-03-09 NOTE — Progress Notes (Signed)
Internal Medicine Clinic Attending  I saw and evaluated the patient.  I personally confirmed the key portions of the history and exam documented by Dr. Marchia Bond and I reviewed pertinent patient test results.  The assessment, diagnosis, and plan were formulated together and I agree with the documentation in the resident's note.   Well controlled OUD, but some increased cravings recently due to stress at home. We are going to increase dose of Zubsolv to 1 tablet daily, patient may choose to decrease in the future to 0.5 tablets if able.
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
## 2021-03-11 LAB — TOXASSURE SELECT,+ANTIDEPR,UR

## 2021-04-05 ENCOUNTER — Telehealth (INDEPENDENT_AMBULATORY_CARE_PROVIDER_SITE_OTHER): Payer: Medicare Other | Admitting: Internal Medicine

## 2021-04-05 ENCOUNTER — Encounter: Payer: Self-pay | Admitting: Internal Medicine

## 2021-04-05 DIAGNOSIS — F1190 Opioid use, unspecified, uncomplicated: Secondary | ICD-10-CM | POA: Diagnosis not present

## 2021-04-05 MED ORDER — ZUBSOLV 5.7-1.4 MG SL SUBL: 1.0000 | SUBLINGUAL_TABLET | Freq: Every day | SUBLINGUAL | 0 refills | Status: DC
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
## 2021-04-07 NOTE — Progress Notes (Signed)
Internal Medicine Clinic Attending  Case discussed with Dr. Winters  At the time of the visit.  We reviewed the resident's history and pertinent patient test results.  I agree with the assessment, diagnosis, and plan of care documented in the resident's note.  

## 2021-05-02 ENCOUNTER — Encounter: Payer: Self-pay | Admitting: Internal Medicine

## 2021-05-02 ENCOUNTER — Other Ambulatory Visit: Payer: Self-pay | Admitting: Internal Medicine

## 2021-05-02 DIAGNOSIS — F1190 Opioid use, unspecified, uncomplicated: Secondary | ICD-10-CM

## 2021-05-02 MED ORDER — ZUBSOLV 5.7-1.4 MG SL SUBL: 1.0000 | SUBLINGUAL_TABLET | Freq: Every day | SUBLINGUAL | 0 refills | Status: DC

## 2021-05-24 ENCOUNTER — Encounter: Payer: Self-pay | Admitting: Internal Medicine

## 2021-05-24 DIAGNOSIS — F1190 Opioid use, unspecified, uncomplicated: Secondary | ICD-10-CM

## 2021-05-25 MED ORDER — ZUBSOLV 5.7-1.4 MG SL SUBL
1.0000 | SUBLINGUAL_TABLET | Freq: Every day | SUBLINGUAL | 0 refills | Status: DC
Start: 2021-05-25 — End: 2021-05-26

## 2021-05-26 ENCOUNTER — Encounter: Payer: Self-pay | Admitting: Internal Medicine

## 2021-05-26 DIAGNOSIS — F1190 Opioid use, unspecified, uncomplicated: Secondary | ICD-10-CM

## 2021-05-26 MED ORDER — ZUBSOLV 5.7-1.4 MG SL SUBL: 1.5000 | SUBLINGUAL_TABLET | Freq: Every day | SUBLINGUAL | 0 refills | Status: DC

## 2021-05-28 ENCOUNTER — Encounter: Payer: Self-pay | Admitting: Internal Medicine

## 2021-05-28 ENCOUNTER — Telehealth: Payer: Medicare Other | Admitting: Nurse Practitioner

## 2021-05-28 DIAGNOSIS — R112 Nausea with vomiting, unspecified: Secondary | ICD-10-CM | POA: Diagnosis not present

## 2021-05-28 MED ORDER — ONDANSETRON HCL 4 MG PO TABS
4.0000 mg | ORAL_TABLET | Freq: Three times a day (TID) | ORAL | 0 refills | Status: AC | PRN
Start: 2021-05-28 — End: 2021-06-02

## 2021-05-28 NOTE — Progress Notes (Signed)
E-Visit for Nausea and Vomiting   We are sorry that you are not feeling well. Here is how we plan to help!  Based on what you have shared with me it looks like you have a Virus that is irritating your GI tract.  Vomiting is the forceful emptying of a portion of the stomach's content through the mouth.  Although nausea and vomiting can make you feel miserable, it's important to remember that these are not diseases, but rather symptoms of an underlying illness.  When we treat short term symptoms, we always caution that any symptoms that persist should be fully evaluated in a medical office.  I am sorry you have ran out of your medication. We do not prescribe medications such as suboxone or zubslov through telehealth. You will need to follow up with your prescribing provider. In the interim, I will prescribe medication to help with your nausea and vomiting.   I have prescribed a medication that will help alleviate your symptoms and allow you to stay hydrated:  Zofran 4 mg 1 tablet every 8 hours as needed for nausea and vomiting  HOME CARE: Drink clear liquids.  This is very important! Dehydration (the lack of fluid) can lead to a serious complication.  Start off with 1 tablespoon every 5 minutes for 8 hours. You may begin eating bland foods after 8 hours without vomiting.  Start with saltine crackers, white bread, rice, mashed potatoes, applesauce. After 48 hours on a bland diet, you may resume a normal diet. Try to go to sleep.  Sleep often empties the stomach and relieves the need to vomit.  GET HELP RIGHT AWAY IF:  Your symptoms do not improve or worsen within 2 days after treatment. You have a fever for over 3 days. You cannot keep down fluids after trying the medication.  MAKE SURE YOU:  Understand these instructions. Will watch your condition. Will get help right away if you are not doing well or get worse.    Thank you for choosing an e-visit.  Your e-visit answers were reviewed  by a board certified advanced clinical practitioner to complete your personal care plan. Depending upon the condition, your plan could have included both over the counter or prescription medications.  Please review your pharmacy choice. Make sure the pharmacy is open so you can pick up prescription now. If there is a problem, you may contact your provider through [employer] and have the prescription routed to another pharmacy.  Your safety is important to [place]. If you have drug allergies check your prescription carefully.   For the next 24 hours you can use MyChart to ask questions about today's visit, request a non-urgent call back, or ask for a work or school excuse. You will get an email in the next two days asking about your experience. I hope that your e-visit has been valuable and will speed your recovery.   I have spent at least 5 minutes reviewing and documenting in the patient's chart.

## 2021-05-29 ENCOUNTER — Other Ambulatory Visit: Payer: Self-pay | Admitting: Internal Medicine

## 2021-05-29 ENCOUNTER — Telehealth: Payer: Self-pay | Admitting: Internal Medicine

## 2021-05-29 DIAGNOSIS — F1190 Opioid use, unspecified, uncomplicated: Secondary | ICD-10-CM

## 2021-05-29 MED ORDER — ZUBSOLV 5.7-1.4 MG SL SUBL: 1.5000 | SUBLINGUAL_TABLET | Freq: Every day | SUBLINGUAL | 0 refills | Status: DC

## 2021-05-29 MED ORDER — BUPRENORPHINE HCL-NALOXONE HCL 8-2 MG SL SUBL: 1.5000 | SUBLINGUAL_TABLET | Freq: Every day | SUBLINGUAL | 0 refills | Status: DC

## 2021-05-29 NOTE — Telephone Encounter (Signed)
Spoke with patient regarding Zubsolv prescription that was sent in on 11/3. Her pharmacy has this prescription, however, they do not have this medication on formulary and will not be able to get it to her until 11/8. The patient has never been out of her medication before and states that she is now experiencing withdrawal symptoms. Spoke with Dr. Criselda Peaches, and will send in Suboxone to her pharmacy for a few days, until she is able to get the Zubsolv. Relayed this message back to the patient and she will be able to pick up the Suboxone today.

## 2021-05-29 NOTE — Telephone Encounter (Addendum)
Reviewed PDMP, sent refill for Zubsolv 5.7-1.4 mg per PCP request. Dose has been increased to 1.5 tablets daily (#45 / month). PCP discussed this with patient during a recent mychart encounter.   ADDENDUM: Rx changed to Suboxone tablets 8-2 mg, 1.5 daily. The on call resident will speak with the pharmacy to cancel prior script.

## 2021-05-29 NOTE — Addendum Note (Signed)
Addended by: Burnell Blanks on: 05/29/2021 12:04 PM   Modules accepted: Orders

## 2021-05-30 ENCOUNTER — Other Ambulatory Visit: Payer: Self-pay

## 2021-06-11 ENCOUNTER — Telehealth: Payer: Medicare Other | Admitting: Family

## 2021-06-11 DIAGNOSIS — B850 Pediculosis due to Pediculus humanus capitis: Secondary | ICD-10-CM | POA: Diagnosis not present

## 2021-06-12 MED ORDER — IVERMECTIN 0.5 % EX LOTN
1.0000 | TOPICAL_LOTION | Freq: Once | CUTANEOUS | 1 refills | Status: AC
Start: 2021-06-12 — End: 2021-06-12

## 2021-06-12 MED ORDER — PERMETHRIN 1 % EX LOTN: 1.0000 "application " | TOPICAL_LOTION | Freq: Once | CUTANEOUS | 1 refills | Status: AC

## 2021-06-12 NOTE — Addendum Note (Signed)
Addended by: Jannifer Rodney A on: 06/12/2021 10:01 AM   Modules accepted: Orders

## 2021-06-12 NOTE — Progress Notes (Signed)
E-Visit for Lice  We are sorry that you are not feeling well. Here is how we plan to help!  Based on what you have shared with me it looks like you have head lice.  Lice are very tiny insects that like to live on hair.  An infection with head lice is very common in school-age children, but anyone can get lice.  Head lice do not live on pets and cannot jump, fly or walk on the ground.  But they easily pass from person to person through close contact and on clothes, bed linens, brushes, combs, hats and toys.  Head lice infections are not dangerous but they can be difficult to treat.  Lice are contagious and should be treated right away to stop infection from spreading.   I recommend that you use: I have prescribed Permethrin lotion. Apply appropriate volume for hair length to dry hair and completely saturate the scalp; leave on for 10 minutes; rinse thoroughly with water; repeat in 7 days.  Be sure to use the nit comb as directed.   HOME CARE:  You should treat all members in your household. Wash towels, clothes, bed linens, cloth toys, hats and other personal items in hot water and dry on high heat. Wash all combs and brushes in very hot soapy water or throw them out and buy new ones. Vacuum floors and furniture and throw out the bag afterwards. Do not go to work or school until the morning after your treatment for lice. If family members are also infected, you may need to notify your child's day care or school so that other children can be checked.  GET HELP RIGHT AWAY IF:  Your treatment does not get rid of lice. You develop sores on your scalp that become infected, worsen or do not heal. You or your child become itchy or are scratching in areas other than the scalp.  MAKE SURE YOU:  Understand these instructions. Will watch your condition. Will get help right away if you are not doing well or get worse.  Your e-visit answers were reviewed by a board certified advanced clinical  practitioner to complete your personal care plan.  Depending upon the condition, your plan could have included both over the counter or prescription medications.    Please review your pharmacy choice.  Make sure the pharmacy is open so you can pick up prescription now.   If there is a problem, you may contact your provider through [employer] and have the prescription routed to another pharmacy. Your safety is important to [place].  If you have drug allergies check your prescription carefully.    For the next 24 hours you can use MyChart to ask questions about today's visit, request a non-urgent call back, or ask for a work or school excuse.  You will get an email in the next 2 days asking about your experience.  I hope that your e-visit has been valuable and will speed your recovery.  Approximately 5 minutes was spent documenting and reviewing patient's chart.

## 2021-06-26 ENCOUNTER — Encounter: Payer: Self-pay | Admitting: Internal Medicine

## 2021-06-26 DIAGNOSIS — F1190 Opioid use, unspecified, uncomplicated: Secondary | ICD-10-CM

## 2021-06-27 MED ORDER — ZUBSOLV 5.7-1.4 MG SL SUBL: 1.5000 | SUBLINGUAL_TABLET | Freq: Every day | SUBLINGUAL | 0 refills | Status: DC

## 2021-07-04 ENCOUNTER — Encounter: Payer: Medicare Other | Admitting: Internal Medicine

## 2021-07-25 ENCOUNTER — Other Ambulatory Visit: Payer: Self-pay | Admitting: Internal Medicine

## 2021-07-25 DIAGNOSIS — F1190 Opioid use, unspecified, uncomplicated: Secondary | ICD-10-CM

## 2021-07-26 ENCOUNTER — Encounter: Payer: Self-pay | Admitting: Internal Medicine

## 2021-07-26 ENCOUNTER — Other Ambulatory Visit: Payer: Self-pay | Admitting: Internal Medicine

## 2021-07-26 ENCOUNTER — Other Ambulatory Visit: Payer: Self-pay

## 2021-07-26 DIAGNOSIS — F1190 Opioid use, unspecified, uncomplicated: Secondary | ICD-10-CM

## 2021-07-26 MED ORDER — ZUBSOLV 5.7-1.4 MG SL SUBL: 1.5000 | SUBLINGUAL_TABLET | Freq: Every day | SUBLINGUAL | 0 refills | Status: DC

## 2021-07-26 NOTE — Telephone Encounter (Signed)
Next appointment 08/02/2021.  Last refill 06/27/2021 Toxassure-03/08/2021

## 2021-07-26 NOTE — Telephone Encounter (Signed)
Buprenorphine HCl-Naloxone HCl (ZUBSOLV) 5.7-1.4 MG SUBL, REFILL REQUEST @ [facility] [phone] - [place], [place] - [address] AT [facility] [address].

## 2021-07-27 ENCOUNTER — Encounter: Payer: Self-pay | Admitting: Internal Medicine

## 2021-07-27 MED ORDER — ZUBSOLV 5.7-1.4 MG SL SUBL: 1.5000 | SUBLINGUAL_TABLET | Freq: Every day | SUBLINGUAL | 0 refills | Status: DC

## 2021-07-27 NOTE — Telephone Encounter (Signed)
Ok, I have prescribed. Strange situation.

## 2021-07-27 NOTE — Telephone Encounter (Signed)
Patient calling back for different prescriber to send Rx for suboxone. She states [facility] will not even let her pay out of pocket as they're saying that Dr. Mikey Bussing cannot prescribe this medication. Patient is completely out of med.

## 2021-08-02 ENCOUNTER — Encounter: Payer: Self-pay | Admitting: *Deleted

## 2021-08-02 ENCOUNTER — Other Ambulatory Visit: Payer: Self-pay

## 2021-08-02 ENCOUNTER — Encounter: Payer: Self-pay | Admitting: Internal Medicine

## 2021-08-02 ENCOUNTER — Ambulatory Visit (INDEPENDENT_AMBULATORY_CARE_PROVIDER_SITE_OTHER): Payer: Medicare Other | Admitting: Internal Medicine

## 2021-08-02 DIAGNOSIS — F1190 Opioid use, unspecified, uncomplicated: Secondary | ICD-10-CM

## 2021-08-02 MED ORDER — ZUBSOLV 5.7-1.4 MG SL SUBL: 2.0000 | SUBLINGUAL_TABLET | Freq: Every day | SUBLINGUAL | 0 refills | Status: DC

## 2021-08-02 NOTE — Progress Notes (Signed)
°  [facility] Internal Medicine Residency Telephone Encounter Continuity Care Appointment  HPI:  This telephone encounter was created for Ms. Iva Boop on 08/02/2021 for follow up of opioid use disorder.   Assessment / Plan / Recommendations:  Problem List Items Addressed This Visit       Other   Opioid use disorder - Primary (Chronic)    Currently taking 1.5 tablets of Zubsolv 5.7-1.4mg  daily. She has been experiencing increased cravings and feels that it has been wearing off earlier, resulting in muscle aches.  Denies illicit opioid use since her LOV. PDMP reviewed and appropriate. Last UDS 02/2021 was appropriate.  Increase Zubsolv 5.7-1.4mg  to two tablets daily. Follow up 4 weeks. She was encouraged to arrange an in person office visit with Dr. Criselda Peaches prior to next fill.         Consent and Medical Decision Making:  Patient discussed with Dr. Criselda Peaches This is a telephone encounter between Iva Boop and [employer] on 08/02/2021 for OUD follow up. The visit was conducted with the patient located at home and [employer] at [facility]. The patient's identity was confirmed using their DOB and current address. The patient has consented to being evaluated through a telephone encounter and understands the associated risks (an examination cannot be done and the patient may need to come in for an appointment) / benefits (allows the patient to remain at home, decreasing exposure to coronavirus). I personally spent 10 minutes on medical discussion.    Elige Radon, MD Internal Medicine Resident PGY-3 [facility] 08/02/2021 11:34 AM

## 2021-08-02 NOTE — Progress Notes (Signed)
Internal Medicine Clinic Attending  Case discussed with Dr. Christian at the time of the visit.  We reviewed the resident's history and exam and pertinent patient test results.  I agree with the assessment, diagnosis, and plan of care documented in the resident's note.    

## 2021-08-02 NOTE — Assessment & Plan Note (Signed)
Currently taking 1.5 tablets of Zubsolv 5.7-1.4mg  daily. She has been experiencing increased cravings and feels that it has been wearing off earlier, resulting in muscle aches.  Denies illicit opioid use since her LOV. PDMP reviewed and appropriate. Last UDS 02/2021 was appropriate.   Increase Zubsolv 5.7-1.4mg  to two tablets daily.  Follow up 4 weeks. She was encouraged to arrange an in person office visit with Dr. Daryll Drown prior to next fill.

## 2021-08-02 NOTE — Addendum Note (Signed)
Addended by: Debe Coder B on: 08/02/2021 04:17 PM   Modules accepted: Orders

## 2021-09-12 ENCOUNTER — Encounter: Payer: Self-pay | Admitting: Internal Medicine

## 2021-09-13 ENCOUNTER — Other Ambulatory Visit: Payer: Self-pay | Admitting: Internal Medicine

## 2021-09-13 DIAGNOSIS — F1190 Opioid use, unspecified, uncomplicated: Secondary | ICD-10-CM

## 2021-09-13 MED ORDER — ZUBSOLV 5.7-1.4 MG SL SUBL: 2.0000 | SUBLINGUAL_TABLET | Freq: Every day | SUBLINGUAL | 0 refills | Status: DC

## 2021-09-29 ENCOUNTER — Encounter: Payer: Self-pay | Admitting: Internal Medicine

## 2021-09-29 ENCOUNTER — Encounter: Payer: Medicare Other | Admitting: Student

## 2021-10-07 DIAGNOSIS — J029 Acute pharyngitis, unspecified: Secondary | ICD-10-CM | POA: Diagnosis not present

## 2021-10-07 DIAGNOSIS — J028 Acute pharyngitis due to other specified organisms: Secondary | ICD-10-CM | POA: Diagnosis not present

## 2021-10-07 DIAGNOSIS — H9201 Otalgia, right ear: Secondary | ICD-10-CM | POA: Diagnosis not present

## 2021-10-07 DIAGNOSIS — Z720 Tobacco use: Secondary | ICD-10-CM | POA: Diagnosis not present

## 2021-10-07 DIAGNOSIS — F17210 Nicotine dependence, cigarettes, uncomplicated: Secondary | ICD-10-CM | POA: Diagnosis not present

## 2021-10-11 ENCOUNTER — Ambulatory Visit (INDEPENDENT_AMBULATORY_CARE_PROVIDER_SITE_OTHER): Payer: Medicare Other | Admitting: Student in an Organized Health Care Education/Training Program

## 2021-10-11 VITALS — BP 135/91 | HR 87 | Wt 199.1 lb

## 2021-10-11 DIAGNOSIS — F1190 Opioid use, unspecified, uncomplicated: Secondary | ICD-10-CM

## 2021-10-11 DIAGNOSIS — B182 Chronic viral hepatitis C: Secondary | ICD-10-CM | POA: Diagnosis not present

## 2021-10-11 DIAGNOSIS — F4312 Post-traumatic stress disorder, chronic: Secondary | ICD-10-CM | POA: Diagnosis not present

## 2021-10-11 MED ORDER — SERTRALINE HCL 25 MG PO TABS: 25.0000 mg | ORAL_TABLET | Freq: Every day | ORAL | 1 refills | Status: DC

## 2021-10-11 MED ORDER — ZUBSOLV 5.7-1.4 MG SL SUBL: 2.5000 | SUBLINGUAL_TABLET | Freq: Every day | SUBLINGUAL | 1 refills | Status: DC
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
## 2021-10-11 NOTE — Assessment & Plan Note (Signed)
Chronic hepatitis C infection treated with Epclusa at the end of 2021. Will check CMP and HCV viral load today to confirm sustained viral response. ?
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
## 2021-10-12 LAB — CMP14 + ANION GAP
ALT: 11 IU/L (ref 0–32)
AST: 27 IU/L (ref 0–40)
Albumin/Globulin Ratio: 1.6 (ref 1.2–2.2)
Albumin: 4.4 g/dL (ref 3.8–4.8)
Alkaline Phosphatase: 69 IU/L (ref 44–121)
Anion Gap: 14 mmol/L (ref 10.0–18.0)
BUN/Creatinine Ratio: 17 (ref 9–23)
BUN: 8 mg/dL (ref 6–20)
Bilirubin Total: <0.2 mg/dL (ref 0.0–1.2)
CO2: 23 mmol/L (ref 20–29)
Calcium: 9 mg/dL (ref 8.7–10.2)
Chloride: 106 mmol/L (ref 96–106)
Creatinine, Ser: 0.48 mg/dL — ABNORMAL LOW (ref 0.57–1.00)
Globulin, Total: 2.8 g/dL (ref 1.5–4.5)
Glucose: 86 mg/dL (ref 70–99)
Potassium: 4.1 mmol/L (ref 3.5–5.2)
Sodium: 143 mmol/L (ref 134–144)
Total Protein: 7.2 g/dL (ref 6.0–8.5)
eGFR: 129 mL/min/{1.73_m2} (ref >59)

## 2021-10-12 LAB — HCV RNA QUANT
HCV log10: 5.369 log10 IU/mL
Hepatitis C Quantitation: 234000 IU/mL

## 2021-10-14 LAB — TOXASSURE SELECT,+ANTIDEPR,UR

## 2021-10-19 ENCOUNTER — Encounter: Payer: Self-pay | Admitting: Internal Medicine

## 2021-10-19 MED ORDER — PHENTERMINE HCL 37.5 MG PO TABS: 37.5000 mg | ORAL_TABLET | Freq: Every day | ORAL | 2 refills | Status: DC

## 2021-11-21 ENCOUNTER — Encounter: Payer: Self-pay | Admitting: Internal Medicine

## 2021-11-21 DIAGNOSIS — F1190 Opioid use, unspecified, uncomplicated: Secondary | ICD-10-CM

## 2021-12-03 ENCOUNTER — Telehealth: Payer: Medicare Other | Admitting: Nurse Practitioner

## 2021-12-03 DIAGNOSIS — B359 Dermatophytosis, unspecified: Secondary | ICD-10-CM | POA: Diagnosis not present

## 2021-12-03 MED ORDER — NYSTATIN 100000 UNIT/GM EX CREA: 1.0000 "application " | TOPICAL_CREAM | Freq: Two times a day (BID) | CUTANEOUS | 0 refills | Status: DC

## 2021-12-03 NOTE — Progress Notes (Signed)
E Visit for Rash  We are sorry that you are not feeling well. Here is how we plan to help!  Based upon your presentation it appears you have a fungal infection.  I have prescribed: and Nystatin cream apply to the affected area twice daily   HOME CARE:  Take cool showers and avoid direct sunlight. Apply cool compress or wet dressings. Take a bath in an oatmeal bath.  Sprinkle content of one Aveeno packet under running faucet with comfortably warm water.  Bathe for 15-20 minutes, 1-2 times daily.  Pat dry with a towel. Do not rub the rash. Use hydrocortisone cream. Take an antihistamine like Benadryl for widespread rashes that itch.  The adult dose of Benadryl is 25-50 mg by mouth 4 times daily. Caution:  This type of medication may cause sleepiness.  Do not drink alcohol, drive, or operate dangerous machinery while taking antihistamines.  Do not take these medications if you have prostate enlargement.  Read package instructions thoroughly on all medications that you take.  GET HELP RIGHT AWAY IF:  Symptoms don't go away after treatment. Severe itching that persists. If you rash spreads or swells. If you rash begins to smell. If it blisters and opens or develops a yellow-brown crust. You develop a fever. You have a sore throat. You become short of breath.  MAKE SURE YOU:  Understand these instructions. Will watch your condition. Will get help right away if you are not doing well or get worse.  Thank you for choosing an e-visit.  Your e-visit answers were reviewed by a board certified advanced clinical practitioner to complete your personal care plan. Depending upon the condition, your plan could have included both over the counter or prescription medications.  Please review your pharmacy choice. Make sure the pharmacy is open so you can pick up prescription now. If there is a problem, you may contact your provider through MyChart messaging and have the prescription routed to  another pharmacy.  Your safety is important to us. If you have drug allergies check your prescription carefully.   For the next 24 hours you can use MyChart to ask questions about today's visit, request a non-urgent call back, or ask for a work or school excuse. You will get an email in the next two days asking about your experience. I hope that your e-visit has been valuable and will speed your recovery.  

## 2021-12-03 NOTE — Progress Notes (Signed)
I have spent 5 minutes in review of e-visit questionnaire, review and updating patient chart, medical decision making and response to patient.  ° °Tyqwan Pink W Lacinda Curvin, NP ° °  °

## 2021-12-13 ENCOUNTER — Other Ambulatory Visit: Payer: Self-pay | Admitting: *Deleted

## 2021-12-13 MED ORDER — SERTRALINE HCL 25 MG PO TABS: 25.0000 mg | ORAL_TABLET | Freq: Every day | ORAL | 1 refills | Status: DC

## 2021-12-23 MED ORDER — ZUBSOLV 5.7-1.4 MG SL SUBL: 2.5000 | SUBLINGUAL_TABLET | Freq: Every day | SUBLINGUAL | 1 refills | Status: DC

## 2022-02-17 ENCOUNTER — Encounter: Payer: Self-pay | Admitting: Internal Medicine

## 2022-02-17 ENCOUNTER — Encounter: Payer: Medicare Other | Admitting: Internal Medicine

## 2022-03-02 ENCOUNTER — Other Ambulatory Visit: Payer: Self-pay | Admitting: Internal Medicine

## 2022-03-09 ENCOUNTER — Encounter: Payer: Medicare Other | Admitting: Internal Medicine

## 2022-03-16 ENCOUNTER — Encounter: Payer: Medicare Other | Admitting: Internal Medicine

## 2022-04-13 ENCOUNTER — Other Ambulatory Visit: Payer: Self-pay | Admitting: Internal Medicine

## 2022-04-13 DIAGNOSIS — F1190 Opioid use, unspecified, uncomplicated: Secondary | ICD-10-CM

## 2022-04-13 MED ORDER — ZUBSOLV 5.7-1.4 MG SL SUBL: 2.5000 | SUBLINGUAL_TABLET | Freq: Every day | SUBLINGUAL | 1 refills | Status: DC

## 2022-07-03 ENCOUNTER — Encounter: Payer: Self-pay | Admitting: Internal Medicine

## 2022-07-03 DIAGNOSIS — F1190 Opioid use, unspecified, uncomplicated: Secondary | ICD-10-CM

## 2022-07-06 MED ORDER — ZUBSOLV 5.7-1.4 MG SL SUBL: 2.5000 | SUBLINGUAL_TABLET | Freq: Every day | SUBLINGUAL | 1 refills | Status: DC

## 2022-10-01 ENCOUNTER — Encounter: Payer: Self-pay | Admitting: Internal Medicine

## 2022-10-04 ENCOUNTER — Encounter: Payer: Self-pay | Admitting: Student in an Organized Health Care Education/Training Program

## 2022-10-04 DIAGNOSIS — F1190 Opioid use, unspecified, uncomplicated: Secondary | ICD-10-CM

## 2022-10-04 MED ORDER — ZUBSOLV 5.7-1.4 MG SL SUBL: 2.5000 | SUBLINGUAL_TABLET | Freq: Every day | SUBLINGUAL | 1 refills | Status: DC

## 2022-10-16 ENCOUNTER — Telehealth: Payer: Medicare Other | Admitting: Nurse Practitioner

## 2022-10-16 DIAGNOSIS — R112 Nausea with vomiting, unspecified: Secondary | ICD-10-CM | POA: Diagnosis not present

## 2022-10-17 MED ORDER — ONDANSETRON HCL 4 MG PO TABS: 4.0000 mg | ORAL_TABLET | Freq: Three times a day (TID) | ORAL | 0 refills | Status: DC | PRN

## 2022-10-17 NOTE — Progress Notes (Signed)
E-Visit for Nausea and Vomiting   We are sorry that you are not feeling well. Here is how we plan to help!  Based on what you have shared with me it looks like you have a Virus that is irritating your GI tract.  Vomiting is the forceful emptying of a portion of the stomach's content through the mouth.  Although nausea and vomiting can make you feel miserable, it's important to remember that these are not diseases, but rather symptoms of an underlying illness.  When we treat short term symptoms, we always caution that any symptoms that persist should be fully evaluated in a medical office.  I have prescribed a medication that will help alleviate your symptoms and allow you to stay hydrated:  Zofran 4 mg 1 tablet every 8 hours as needed for nausea and vomiting  HOME CARE: Drink clear liquids.  This is very important! Dehydration (the lack of fluid) can lead to a serious complication.  Start off with 1 tablespoon every 5 minutes for 8 hours. You may begin eating bland foods after 8 hours without vomiting.  Start with saltine crackers, white bread, rice, mashed potatoes, applesauce. After 48 hours on a bland diet, you may resume a normal diet. Try to go to sleep.  Sleep often empties the stomach and relieves the need to vomit.  GET HELP RIGHT AWAY IF:  Your symptoms do not improve or worsen within 2 days after treatment. You have a fever for over 3 days. You cannot keep down fluids after trying the medication.  MAKE SURE YOU:  Understand these instructions. Will watch your condition. Will get help right away if you are not doing well or get worse.    Thank you for choosing an e-visit.  Your e-visit answers were reviewed by a board certified advanced clinical practitioner to complete your personal care plan. Depending upon the condition, your plan could have included both over the counter or prescription medications.  Please review your pharmacy choice. Make sure the pharmacy is open so  you can pick up prescription now. If there is a problem, you may contact your provider through MyChart messaging and have the prescription routed to another pharmacy.  Your safety is important to us. If you have drug allergies check your prescription carefully.   For the next 24 hours you can use MyChart to ask questions about today's visit, request a non-urgent call back, or ask for a work or school excuse. You will get an email in the next two days asking about your experience. I hope that your e-visit has been valuable and will speed your recovery.   Meds ordered this encounter  Medications   ondansetron (ZOFRAN) 4 MG tablet    Sig: Take 1 tablet (4 mg total) by mouth every 8 (eight) hours as needed for nausea or vomiting.    Dispense:  20 tablet    Refill:  0    I spent approximately 5 minutes reviewing the patient's history, current symptoms and coordinating their care today.   

## 2023-01-03 ENCOUNTER — Encounter: Payer: Self-pay | Admitting: Internal Medicine

## 2023-01-05 ENCOUNTER — Other Ambulatory Visit: Payer: Self-pay | Admitting: *Deleted

## 2023-01-05 DIAGNOSIS — F1190 Opioid use, unspecified, uncomplicated: Secondary | ICD-10-CM

## 2023-01-05 MED ORDER — ZUBSOLV 5.7-1.4 MG SL SUBL: 2.5000 | SUBLINGUAL_TABLET | Freq: Every day | SUBLINGUAL | 1 refills | Status: DC

## 2023-01-05 NOTE — Telephone Encounter (Signed)
Please have Ms. Nazaryan schedule with me in 1-2 months in person.  Thank you

## 2023-04-01 ENCOUNTER — Encounter: Payer: Self-pay | Admitting: Internal Medicine

## 2023-04-01 DIAGNOSIS — F1190 Opioid use, unspecified, uncomplicated: Secondary | ICD-10-CM

## 2023-04-02 MED ORDER — BUPRENORPHINE HCL-NALOXONE HCL 2.9-0.71 MG SL SUBL: 2.9000 mg | SUBLINGUAL_TABLET | Freq: Every day | SUBLINGUAL | 0 refills | Status: DC

## 2023-04-12 MED ORDER — SERTRALINE HCL 25 MG PO TABS: 25.0000 mg | ORAL_TABLET | Freq: Every day | ORAL | 1 refills | Status: DC

## 2023-04-27 ENCOUNTER — Other Ambulatory Visit: Payer: Self-pay | Admitting: Internal Medicine

## 2023-04-27 MED ORDER — BUPRENORPHINE HCL-NALOXONE HCL 0.7-0.18 MG SL SUBL: 0.7000 mg | SUBLINGUAL_TABLET | Freq: Every day | SUBLINGUAL | 0 refills | Status: DC

## 2023-05-07 MED ORDER — CLONIDINE HCL 0.1 MG PO TABS: 0.1000 mg | ORAL_TABLET | Freq: Three times a day (TID) | ORAL | 11 refills | Status: DC | PRN

## 2023-05-07 NOTE — Addendum Note (Signed)
Addended by: Erlinda Hong T on: 05/07/2023 01:33 PM   Modules accepted: Orders

## 2023-06-09 ENCOUNTER — Other Ambulatory Visit: Payer: Self-pay | Admitting: Internal Medicine

## 2023-06-23 ENCOUNTER — Telehealth: Payer: Medicare Other | Admitting: Family Medicine

## 2023-06-23 DIAGNOSIS — J029 Acute pharyngitis, unspecified: Secondary | ICD-10-CM

## 2023-06-23 NOTE — Progress Notes (Signed)
E-Visit for Sore Throat  We are sorry that you are not feeling well.  Here is how we plan to help!  Your symptoms indicate a likely viral infection (Pharyngitis).   Pharyngitis is inflammation in the back of the throat which can cause a sore throat, scratchiness and sometimes difficulty swallowing.   Pharyngitis is typically caused by a respiratory virus and will just run its course.  Please keep in mind that your symptoms could last up to 10 days.  For throat pain, we recommend over the counter oral pain relief medications such as acetaminophen or aspirin, or anti-inflammatory medications such as ibuprofen or naproxen sodium.  Topical treatments such as oral throat lozenges or sprays may be used as needed.  Avoid close contact with loved ones, especially the very young and elderly.  Remember to wash your hands thoroughly throughout the day as this is the number one way to prevent the spread of infection and wipe down door knobs and counters with disinfectant.  After careful review of your answers, I would not recommend an antibiotic for your condition.  Antibiotics should not be used to treat conditions that we suspect are caused by viruses like the virus that causes the common cold or flu. However, some people can have Strep with atypical symptoms. You may need formal testing in clinic or office to confirm if your symptoms continue or worsen.  Providers prescribe antibiotics to treat infections caused by bacteria. Antibiotics are very powerful in treating bacterial infections when they are used properly.  To maintain their effectiveness, they should be used only when necessary.  Overuse of antibiotics has resulted in the development of super bugs that are resistant to treatment!    Home Care: Only take medications as instructed by your medical team. Do not drink alcohol while taking these medications. A steam or ultrasonic humidifier can help congestion.  You can place a towel over your head and  breathe in the steam from hot water coming from a faucet. Avoid close contacts especially the very young and the elderly. Cover your mouth when you cough or sneeze. Always remember to wash your hands.  Get Help Right Away If: You develop worsening fever or throat pain. You develop a severe head ache or visual changes. Your symptoms persist after you have completed your treatment plan.  Make sure you Understand these instructions. Will watch your condition. Will get help right away if you are not doing well or get worse.   Thank you for choosing an e-visit.  Your e-visit answers were reviewed by a board certified advanced clinical practitioner to complete your personal care plan. Depending upon the condition, your plan could have included both over the counter or prescription medications.  Please review your pharmacy choice. Make sure the pharmacy is open so you can pick up prescription now. If there is a problem, you may contact your provider through MyChart messaging and have the prescription routed to another pharmacy.  Your safety is important to us. If you have drug allergies check your prescription carefully.   For the next 24 hours you can use MyChart to ask questions about today's visit, request a non-urgent call back, or ask for a work or school excuse. You will get an email in the next two days asking about your experience. I hope that your e-visit has been valuable and will speed your recovery.    have provided 5 minutes of non face to face time during this encounter for chart review and documentation.   

## 2023-07-11 ENCOUNTER — Ambulatory Visit: Payer: Medicare Other | Admitting: Student

## 2023-07-11 ENCOUNTER — Other Ambulatory Visit (HOSPITAL_COMMUNITY): Payer: Self-pay

## 2023-07-11 VITALS — BP 118/83 | HR 120 | Temp 98.6°F | Ht 60.0 in | Wt 222.4 lb

## 2023-07-11 DIAGNOSIS — F32A Depression, unspecified: Secondary | ICD-10-CM

## 2023-07-11 DIAGNOSIS — F1190 Opioid use, unspecified, uncomplicated: Secondary | ICD-10-CM

## 2023-07-11 MED ORDER — SERTRALINE HCL 50 MG PO TABS
50.0000 mg | ORAL_TABLET | Freq: Every day | ORAL | 3 refills | Status: DC
  Filled 2023-07-11: qty 30, 30d supply, fill #0

## 2023-07-11 MED ORDER — ZUBSOLV 2.9-0.71 MG SL SUBL: 1.0000 | SUBLINGUAL_TABLET | Freq: Every day | SUBLINGUAL | 0 refills | Status: DC

## 2023-07-11 MED ORDER — ZUBSOLV 2.9-0.71 MG SL SUBL
1.0000 | SUBLINGUAL_TABLET | Freq: Every day | SUBLINGUAL | 0 refills | Status: DC
  Filled 2023-07-11: qty 30, 30d supply, fill #0

## 2023-07-11 NOTE — Assessment & Plan Note (Signed)
She has been on Zoloft 25 mg for about 2 months, which she describes as ineffective.  She denies active SI or HI.  PHQ-9 score of 13.Patient has been compliant with psychotherapy, which she describes as very helpful.  Will increase Zoloft to 50 mg.  --Increase Zoloft to 50 mg.

## 2023-07-11 NOTE — Progress Notes (Signed)
   CC: FU on Suboxone tapering.  HPI:  [name] is a 32 y.o. female living with a history stated below and presents today for FU on Suboxone tapering.  Please see problem based assessment and plan for additional details.  Past Medical History:  Diagnosis Date   Anxiety    Bipolar disorder (HCC)    Dental caries    Depression    H/O wisdom tooth extraction    Hepatitis B    Hepatitis C    dx 02/2018  2B   History of heroin use    clean since 10/2017   Hx of appendectomy    Post traumatic stress disorder (PTSD)    Postpartum hemorrhage    Pre-eclampsia    " back to normal after I had my daughter on 01/20/19"   Sinus tachycardia    Smoker     Current Outpatient Medications on File Prior to Visit  Medication Sig Dispense Refill   cloNIDine (CATAPRES) 0.1 MG tablet Take 1 tablet (0.1 mg total) by mouth every 8 (eight) hours as needed (take one tablet as needed for anxiety related to withdrawal symptoms). 60 tablet 11   nystatin cream (MYCOSTATIN) Apply 1 application. topically 2 (two) times daily. 30 g 0   ondansetron (ZOFRAN) 4 MG tablet Take 1 tablet (4 mg total) by mouth every 8 (eight) hours as needed for nausea or vomiting. 20 tablet 0   No current facility-administered medications on file prior to visit.     Review of Systems: ROS negative except for what is noted on the assessment and plan.  Vitals:   07/11/23 1351  BP: 118/83  Pulse: (!) 120  Temp: 98.6 F (37 C)  TempSrc: Oral  Weight: 222 lb 6.4 oz (100.9 kg)  Height: 5' (1.524 m)    Physical Exam: Constitutional: Well appearing, not in acute distress. Pulmonary/Chest: normal work of breathing on room air, lungs clear to auscultation bilaterally Psych: Mood and affect appropriate.  Assessment & Plan:  Opioid use disorder (HCC) She has been on treatment for OUD for  6 years, with a recent tapering off Zubsolv (2 months ago). For the past month however, she endorses depressed mood, irritability, high  levels of cravings.  She denies any recent drug use, any new stressors at home.She is agreeable to going back on Zubsolv, since it has been helpful in the past for increased cravings.  PDMP reviewed and appropriate.  - Zubsolv 2.9 mg-0.71 -Follow-up in 4 weeks. -Check UDS at next office visit.  Depression She has been on Zoloft 25 mg for about 2 months, which she describes as ineffective.  She denies active SI or HI.  PHQ-9 score of 13.Patient has been compliant with psychotherapy, which she describes as very helpful.  Will increase Zoloft to 50 mg.  --Increase Zoloft to 50 mg.  Patient seen with Dr. Darrow Bussing, MD [facility] Internal Medicine, PGY-1 Phone: [phone] Date 07/11/2023 Time 3:06 PM

## 2023-07-11 NOTE — Patient Instructions (Addendum)
Thank you, Ms.Iva Boop for allowing [place] to provide your care today.   Today we discussed :  For opiate use disorder, I have started you back on Zubslov, with a 30-day supply.   2.  For your depression, increased Zoloft dose from 25 to 50 mg.  Continue to meet with therapist as well.  I have ordered the following labs for you:  Lab Orders  No laboratory test(s) ordered today     Referrals ordered today:   Referral Orders  No referral(s) requested today     I have ordered the following medication/changed the following medications:   Stop the following medications: Medications Discontinued During This Encounter  Medication Reason   phentermine (ADIPEX-P) 37.5 MG tablet    Buprenorphine HCl-Naloxone HCl 0.7-0.18 MG SUBL    sertraline (ZOLOFT) 25 MG tablet Reorder     Start the following medications: Meds ordered this encounter  Medications   Buprenorphine HCl-Naloxone HCl (ZUBSOLV) 2.9-0.71 MG SUBL    Sig: Place 1 tablet under the tongue daily.    Dispense:  30 tablet    Refill:  0   sertraline (ZOLOFT) 50 MG tablet    Sig: Take 1 tablet (50 mg total) by mouth daily.    Dispense:  30 tablet    Refill:  3     Follow up: 1 month, she would like to get an appointment on Monday.    Should you have any questions or concerns please call the internal medicine clinic at [phone].    Laretta Bolster, MD  [facility]

## 2023-07-11 NOTE — Assessment & Plan Note (Signed)
She has been on treatment for OUD for  6 years, with a recent tapering off Zubsolv (2 months ago). For the past month however, she endorses depressed mood, irritability, high levels of cravings.  She denies any recent drug use, any new stressors at home.She is agreeable to going back on Zubsolv, since it has been helpful in the past for increased cravings.  PDMP reviewed and appropriate.  - Zubsolv 2.9 mg-0.71 -Follow-up in 4 weeks. -Check UDS at next office visit.

## 2023-07-16 ENCOUNTER — Encounter: Payer: Medicare Other | Admitting: Student

## 2023-07-16 NOTE — Progress Notes (Signed)
Internal Medicine Clinic Attending  I was physically present during the key portions of the resident provided service and participated in the medical decision making of patient's management care. I reviewed pertinent patient test results.  The assessment, diagnosis, and plan were formulated together and I agree with the documentation in the resident's note.  Gust Rung, DO

## 2023-07-23 ENCOUNTER — Other Ambulatory Visit (HOSPITAL_COMMUNITY): Payer: Self-pay

## 2023-08-02 ENCOUNTER — Other Ambulatory Visit: Payer: Self-pay | Admitting: *Deleted

## 2023-08-02 DIAGNOSIS — F1190 Opioid use, unspecified, uncomplicated: Secondary | ICD-10-CM

## 2023-08-02 NOTE — Telephone Encounter (Signed)
 Call from pt who stated she has been prescribed Zubsolve 2.9 (equivalent to 4mg   Suboxone  per pt). But d/t what 's going on (see 12/18 OV note) she has been taking 6mg   and she's going to run out of medication - states 5 days short. And her next appt is not until 08/09/23. Pt's requesting an early refill - send rx to [facility] in [place]; she's not currently in town.

## 2023-08-03 ENCOUNTER — Other Ambulatory Visit: Payer: Self-pay | Admitting: Student

## 2023-08-03 MED ORDER — ZUBSOLV 2.9-0.71 MG SL SUBL: 1.5000 | SUBLINGUAL_TABLET | Freq: Every day | SUBLINGUAL | 0 refills | Status: DC

## 2023-08-03 NOTE — Progress Notes (Signed)
 Patient called the [facility] for a refill on the Zubsolv . It appears that she filled it on 12/18. Patient told me that the dose she has is not working and she was told that she could increase it up to 1.5 tablets daily. I do not see documentation of this conversation. Her last dose is on 08/02/2022.  It appears that she was on a higher dose a few months ago prior to her discontinuing and restarting this medication. I spoke on the phone with her and she informed me that the 1.5 tablet dose is helping her avoid using substances. After assessing the risks vs benefits of sending the medication in, I decided that it would be more of a risk to the patient's health to withhold medication at this time. I will send her a short refill for 1/11-1/16 with instructions to continue 1.5 tablets daily. She has an appointment with me  on January16th and we can assess whether or not to continue increasing the dosing regimen and obtain a ToxAssure.

## 2023-08-06 ENCOUNTER — Telehealth: Payer: Self-pay | Admitting: Student

## 2023-08-06 ENCOUNTER — Other Ambulatory Visit: Payer: Self-pay | Admitting: Student

## 2023-08-06 DIAGNOSIS — F1190 Opioid use, unspecified, uncomplicated: Secondary | ICD-10-CM

## 2023-08-06 MED ORDER — ZUBSOLV 2.9-0.71 MG SL SUBL: 1.5000 | SUBLINGUAL_TABLET | Freq: Every day | SUBLINGUAL | 0 refills | Status: DC

## 2023-08-06 NOTE — Telephone Encounter (Signed)
 Pt states she has been unable to pick up the following medication due to the Dr. Anna on the prescription refill.   Buprenorphine  HCl-Naloxone  HCl 2.9-0.71 MG 1.5  [id number] - [place], [place] - [address] AT [facility] [address], [place] [address] Phone: [phone]  Fax: [phone]

## 2023-08-06 NOTE — Telephone Encounter (Signed)
 Patient called she stated she has been out of medication since Saturday.

## 2023-08-06 NOTE — Progress Notes (Signed)
 Received message that patient was unable to pick up medications. I spoke with the pharmacy and they do not see the script I sent in. Will send another short course until Thursday. I checked the PDMP, she did not pick up the mediations.

## 2023-08-09 ENCOUNTER — Ambulatory Visit: Payer: Medicare Other

## 2023-08-09 ENCOUNTER — Ambulatory Visit: Payer: Medicare Other | Admitting: Student

## 2023-08-09 VITALS — BP 130/95 | HR 102 | Temp 97.5°F | Ht 60.0 in | Wt 218.1 lb

## 2023-08-09 DIAGNOSIS — F1190 Opioid use, unspecified, uncomplicated: Secondary | ICD-10-CM

## 2023-08-09 DIAGNOSIS — B182 Chronic viral hepatitis C: Secondary | ICD-10-CM

## 2023-08-09 DIAGNOSIS — F320 Major depressive disorder, single episode, mild: Secondary | ICD-10-CM | POA: Diagnosis not present

## 2023-08-09 MED ORDER — ZUBSOLV 2.9-0.71 MG SL SUBL: 1.5000 | SUBLINGUAL_TABLET | Freq: Every day | SUBLINGUAL | 0 refills | Status: DC

## 2023-08-09 MED ORDER — SERTRALINE HCL 100 MG PO TABS: 100.0000 mg | ORAL_TABLET | Freq: Every day | ORAL | 1 refills | Status: DC

## 2023-08-09 NOTE — Assessment & Plan Note (Signed)
Patient presents today with concerns of her hepatitis virus.  She was unsuccessfully treated prior with Epclusa.  Plan: Will refer to infectious disease for treatment and management of her hepatitis C.

## 2023-08-09 NOTE — Assessment & Plan Note (Addendum)
Patient reports continued depression symptoms on Zoloft 50 mg.  She was increased from 25 to 50 mg 1 month ago.  She denies SI today. Plan -Will increase to 100 mg daily -Will follow-up at next visit.

## 2023-08-09 NOTE — Patient Instructions (Signed)
Thank you, [name] for allowing [place] to provide your care today. Today we discussed use disorder and hepatitis C.  I have ordered the following labs for you:   Lab Orders         ToxAssure Select,+Antidepr,UR       Referrals ordered today:   Referral Orders  No referral(s) requested today     I have ordered the following medication/changed the following medications:   Stop the following medications: There are no discontinued medications.   Start the following medications: No orders of the defined types were placed in this encounter.    Follow up: 2 weeks   Remember: Remember to call call if you are having cravings or feeling urine of relapse and we can talk about how to safely go up on your medication.  Should you have any questions or concerns please call the internal medicine clinic at [phone].     Faith Rogue, D.O. [facility]

## 2023-08-09 NOTE — Assessment & Plan Note (Addendum)
Patient presents to clinic today for a 1 month follow-up of her opioid use disorder.  At the last office visit, she was started on Zubsolv 2.9-0.71 mg sublingual tablet daily.  1 week ago, I received a refill prescription for the Zubsolv.  Per PDMP review, it appeared that she last filled her treatment roughly 3 weeks prior.  I spoke with the patient if at that time who stated that she increased her dosage from 1 to 1.5 tablets daily to cravings.  Patient is aware that she should contact the office prior to increasing her dose.  She is currently doing well on the 1-1/2 tablets of Zubsolv.  She denies side effects of nausea or constipation.  Plan: -Opioid use contract filled out today -Reviewed PDMP, Zubsolv refilled until February 4 (follow-up appointment) -Toxassure today

## 2023-08-09 NOTE — Progress Notes (Signed)
Established Patient Office Visit  Subjective   Patient ID: AL NOURY, female    DOB: 25-Aug-1990  Age: 33 y.o. MRN: [id number]  Chief Complaint  Patient presents with   OUD    Med refill    [name] is a 33 y.o. who presents to the clinic for a one month follow up of her opoid use in remission on Zubsolv. Please see problem based assessment and plan for additional details.   Past Medical History:  Diagnosis Date   Anxiety    Bipolar disorder (HCC)    Dental caries    Depression    H/O wisdom tooth extraction    Hepatitis B    Hepatitis C    dx 02/2018  2B   History of heroin use    clean since 10/2017   Hx of appendectomy    Post traumatic stress disorder (PTSD)    Postpartum hemorrhage    Pre-eclampsia    " back to normal after I had my daughter on 01/20/19"   Sinus tachycardia    Smoker       Objective:     BP (!) 130/95 (BP Location: Left Arm, Patient Position: Sitting, Cuff Size: Normal)   Pulse (!) 102   Temp (!) 97.5 F (36.4 C) (Oral)   Ht 5' (1.524 m)   Wt 218 lb 1.6 oz (98.9 kg)   SpO2 100%   BMI 42.59 kg/m  BP Readings from Last 3 Encounters:  08/09/23 (!) 130/95  07/11/23 118/83  10/11/21 (!) 135/91      Physical Exam Vitals reviewed.  Constitutional:      General: She is not in acute distress.    Appearance: She is obese. She is not ill-appearing or toxic-appearing.  Cardiovascular:     Rate and Rhythm: Normal rate and regular rhythm.  Pulmonary:     Effort: Pulmonary effort is normal. No respiratory distress.     Breath sounds: Normal breath sounds. No stridor. No wheezing or rhonchi.  Skin:    General: Skin is warm and dry.  Neurological:     Mental Status: She is alert.  Psychiatric:        Mood and Affect: Mood normal.      No results found for any visits on 08/09/23.  Last CBC Lab Results  Component Value Date   WBC 9.2 02/17/2020   HGB 14.0 02/17/2020   HCT 42.1 02/17/2020   MCV 82 02/17/2020   MCH 27.3  02/17/2020   RDW 14.1 02/17/2020   PLT 224 02/17/2020   Last metabolic panel Lab Results  Component Value Date   GLUCOSE 86 10/11/2021   NA 143 10/11/2021   K 4.1 10/11/2021   CL 106 10/11/2021   CO2 23 10/11/2021   BUN 8 10/11/2021   CREATININE 0.48 (L) 10/11/2021   EGFR 129 10/11/2021   CALCIUM 9.0 10/11/2021   PROT 7.2 10/11/2021   ALBUMIN 4.4 10/11/2021   LABGLOB 2.8 10/11/2021   AGRATIO 1.6 10/11/2021   BILITOT <0.2 10/11/2021   ALKPHOS 69 10/11/2021   AST 27 10/11/2021   ALT 11 10/11/2021   ANIONGAP 11 03/13/2019   Last lipids No results found for: "CHOL", "HDL", "LDLCALC", "LDLDIRECT", "TRIG", "CHOLHDL"    The ASCVD Risk score (Arnett DK, et al., 2019) failed to calculate for the following reasons:   The 2019 ASCVD risk score is only valid for ages 35 to 16    Assessment & Plan:   Problem List Items  Addressed This Visit       Digestive   Chronic hepatitis C virus genotype 2 infection (HCC) (Chronic)   Patient presents today with concerns of her hepatitis virus.  She was unsuccessfully treated prior with Epclusa.  Plan: Will refer to infectious disease for treatment and management of her hepatitis C.         Other   Opioid use disorder - Primary (Chronic)   Patient presents to clinic today for a 1 month follow-up of her opioid use disorder.  At the last office visit, she was started on Zubsolv 2.9-0.71 mg sublingual tablet daily.  1 week ago, I received a refill prescription for the Zubsolv.  Per PDMP review, it appeared that she last filled her treatment roughly 3 weeks prior.  I spoke with the patient if at that time who stated that she increased her dosage from 1 to 1.5 tablets daily to cravings.  Patient is aware that she should contact the office prior to increasing her dose.  She is currently doing well on the 1-1/2 tablets of Zubsolv.  She denies side effects of nausea or constipation.  Plan: -Opioid use contract filled out today -Reviewed PDMP,  Zubsolv refilled until February 4 (follow-up appointment) -Toxassure today      Relevant Medications   Buprenorphine HCl-Naloxone HCl (ZUBSOLV) 2.9-0.71 MG SUBL   Other Relevant Orders   ToxAssure Select,+Antidepr,UR   Depression   Patient reports continued depression symptoms on Zoloft 50 mg.  She was increased from 25 to 50 mg 1 month ago.  She denies SI today. Plan -Will increase to 100 mg daily -Will follow-up at next visit.      Relevant Medications   sertraline (ZOLOFT) 100 MG tablet     Return for 2 weeks with [place], 2 weeks with Dr. Ninetta Lights .    Faith Rogue, DO

## 2023-08-13 NOTE — Telephone Encounter (Signed)
 error

## 2023-08-15 LAB — TOXASSURE SELECT,+ANTIDEPR,UR

## 2023-08-15 NOTE — Progress Notes (Signed)
 Internal Medicine Clinic Attending  Case discussed with the resident at the time of the visit.  We reviewed the resident's history and exam and pertinent patient test results.  I agree with the assessment, diagnosis, and plan of care documented in the resident's note.

## 2023-08-15 NOTE — Addendum Note (Signed)
Addended by: Dickie La on: 08/15/2023 10:32 AM   Modules accepted: Level of Service

## 2023-08-16 NOTE — Progress Notes (Signed)
Contacted the patient to go over her tox assure results.  Appears that her results are appropriate.  Patient did report that she is having intense cravings on the 1-1/2 tablets and is wondering if she can go up to 2 tablets before her next visit.  She stated that her daughter is currently in the hospital and she is feeling very stressed and is causing her to have intense cravings at this point.  I instructed the patient that she may go up to 2 tablets daily in the meantime. Patient is not having side effects from the Zubsolv such as nausea or vomiting.

## 2023-08-24 ENCOUNTER — Other Ambulatory Visit: Payer: Self-pay | Admitting: Student

## 2023-08-24 DIAGNOSIS — F1190 Opioid use, unspecified, uncomplicated: Secondary | ICD-10-CM

## 2023-08-24 MED ORDER — ZUBSOLV 2.9-0.71 MG SL SUBL: 2.0000 | SUBLINGUAL_TABLET | Freq: Every day | SUBLINGUAL | 0 refills | Status: AC

## 2023-08-24 NOTE — Progress Notes (Signed)
Spoke with patient on the phone after the prior office visit. We discussed going up to 2 tablets a day from the 1.5 tablets. Patient reported less cravings with the 2 tablets daily. Reviewed PDMP, refilled short course of 2 tablets of Zubsolv daily until follow up appointment on 08/28/2023

## 2023-08-24 NOTE — Telephone Encounter (Signed)
Pt states she was seen on 08/09/2023 abd has taken the following medication as instructed and is now out of the medication   Buprenorphine HCl-Naloxone HCl (ZUBSOLV) 2.9-0.71 MG SUBL   [facility] - [place], [place] - [address]

## 2023-08-24 NOTE — Telephone Encounter (Signed)
Last rx written - 08/09/23. Last OV - 08/09/23. TOX - 08/09/23.

## 2023-08-28 ENCOUNTER — Encounter: Payer: Medicare Other | Admitting: Infectious Diseases

## 2023-08-28 ENCOUNTER — Encounter: Payer: Self-pay | Admitting: Student

## 2023-08-28 NOTE — Progress Notes (Deleted)
   Established Patient Office Visit  Subjective   Patient ID: [name], female    DOB: 1991-02-16  Age: 33 y.o. MRN: [id number]  No chief complaint on file.   [name] is a 33 y.o. who presents to the clinic for a 2 week follow up of OUD on Zubsolv .  Since her last appointment, I spoke with the patient on the phone and stated that she is having intense cravings and was instructed to increase her Zubsolv  (2.9-0.71 mg) to 2 tablets daily, patient reported that her cravings have subsided after we increased her dose to the 2 tablets daily..  Please see problem based assessment and plan for additional details.   Patient Active Problem List   Diagnosis Date Noted   Depression 07/11/2023   Severe obesity (BMI >= 40) (HCC) 10/29/2019   Chronic hepatitis C virus genotype 2 infection (HCC) 03/15/2018   Opioid use disorder 03/05/2018   Chronic post-traumatic stress disorder (PTSD) 03/05/2018        Objective:     There were no vitals taken for this visit. BP Readings from Last 3 Encounters:  08/09/23 (!) 130/95  07/11/23 118/83  10/11/21 (!) 135/91   Wt Readings from Last 3 Encounters:  08/09/23 218 lb 1.6 oz (98.9 kg)  07/11/23 222 lb 6.4 oz (100.9 kg)  10/11/21 199 lb 1.6 oz (90.3 kg)      Physical Exam   No results found for any visits on 08/28/23.  Last metabolic panel Lab Results  Component Value Date   GLUCOSE 86 10/11/2021   NA 143 10/11/2021   K 4.1 10/11/2021   CL 106 10/11/2021   CO2 23 10/11/2021   BUN 8 10/11/2021   CREATININE 0.48 (L) 10/11/2021   EGFR 129 10/11/2021   CALCIUM 9.0 10/11/2021   PROT 7.2 10/11/2021   ALBUMIN 4.4 10/11/2021   LABGLOB 2.8 10/11/2021   AGRATIO 1.6 10/11/2021   BILITOT <0.2 10/11/2021   ALKPHOS 69 10/11/2021   AST 27 10/11/2021   ALT 11 10/11/2021   ANIONGAP 11 03/13/2019   Last hemoglobin A1c No results found for: HGBA1C    The ASCVD Risk score (Arnett DK, et al., 2019) failed to calculate for the  following reasons:   The 2019 ASCVD risk score is only valid for ages 88 to 44    Assessment & Plan:   Problem List Items Addressed This Visit   None  OUD Toxassure today Cravings? Taking two at once? Or? Relapsses? Other substances? Chang to 5.7-1.4 mg  N/V/C?  No follow-ups on file.    Damien Lease, DO

## 2024-01-09 ENCOUNTER — Telehealth: Admitting: Physician Assistant

## 2024-01-09 ENCOUNTER — Encounter

## 2024-01-09 DIAGNOSIS — L989 Disorder of the skin and subcutaneous tissue, unspecified: Secondary | ICD-10-CM | POA: Diagnosis not present

## 2024-01-09 MED ORDER — TRIAMCINOLONE ACETONIDE 0.1 % EX CREA: 1.0000 | TOPICAL_CREAM | Freq: Two times a day (BID) | CUTANEOUS | 0 refills | Status: AC

## 2024-01-09 NOTE — Progress Notes (Signed)
 E Visit for Rash  We are sorry that you are not feeling well. Here is how we plan to help!  Based on what you have shared with me it looks like you may have sustained a spider bite. I would recommend keeping the area clean and dry. Cold compresses can be beneficial. I would recommend OTC Claritin or Xyzal to help reduce inflammation. I have sent in a topical steroid cream to apply as directed. IF you note any further increasing redness, associated with any tenderness or drainage, please let us  know ASAP as these are signs of a secondary infection.    GET HELP RIGHT AWAY IF:  Symptoms don't go away after treatment. Severe itching that persists. If you rash spreads or swells. If you rash begins to smell. If it blisters and opens or develops a yellow-brown crust. You develop a fever. You have a sore throat. You become short of breath.  MAKE SURE YOU:  Understand these instructions. Will watch your condition. Will get help right away if you are not doing well or get worse.  Thank you for choosing an e-visit.  Your e-visit answers were reviewed by a board certified advanced clinical practitioner to complete your personal care plan. Depending upon the condition, your plan could have included both over the counter or prescription medications.  Please review your pharmacy choice. Make sure the pharmacy is open so you can pick up prescription now. If there is a problem, you may contact your provider through [employer] and have the prescription routed to another pharmacy.  Your safety is important to us . If you have drug allergies check your prescription carefully.   For the next 24 hours you can use MyChart to ask questions about today's visit, request a non-urgent call back, or ask for a work or school excuse. You will get an email in the next two days asking about your experience. I hope that your e-visit has been valuable and will speed your recovery.

## 2024-01-09 NOTE — Progress Notes (Signed)
 I have spent 5 minutes in review of e-visit questionnaire, review and updating patient chart, medical decision making and response to patient.   Piedad Climes, PA-C

## 2024-02-09 ENCOUNTER — Telehealth: Admitting: Nurse Practitioner

## 2024-02-09 ENCOUNTER — Encounter: Payer: Self-pay | Admitting: Nurse Practitioner

## 2024-02-09 DIAGNOSIS — A084 Viral intestinal infection, unspecified: Secondary | ICD-10-CM | POA: Diagnosis not present

## 2024-02-09 NOTE — Progress Notes (Signed)
 E-Visit for Diarrhea  Your work note will be under letters/mail    We are sorry that you are not feeling well.  Here is how we plan to help!  Based on what you have shared with me it looks like you have Acute Infectious Diarrhea.  Most cases of acute diarrhea are due to infections with virus and bacteria and are self-limited conditions lasting less than 14 days.  For your symptoms you may take Imodium  2 mg tablets that are over the counter at your local pharmacy. Take two tablet now and then one after each loose stool up to 6 a day.  Antibiotics are not needed for most people with diarrhea.   HOME CARE We recommend changing your diet to help with your symptoms for the next few days. Drink plenty of fluids that contain water salt and sugar. Sports drinks such as Gatorade may help.  You may try broths, soups, bananas, applesauce, soft breads, mashed potatoes or crackers.  You are considered infectious for as long as the diarrhea continues. Hand washing or use of alcohol based hand sanitizers is recommend. It is best to stay out of work or school until your symptoms stop.   GET HELP RIGHT AWAY If you have dark yellow colored urine or do not pass urine frequently you should drink more fluids.   If your symptoms worsen  If you feel like you are going to pass out (faint) You have a new problem  MAKE SURE YOU  Understand these instructions. Will watch your condition. Will get help right away if you are not doing well or get worse.  Thank you for choosing an e-visit.  Your e-visit answers were reviewed by a board certified advanced clinical practitioner to complete your personal care plan. Depending upon the condition, your plan could have included both over the counter or prescription medications.  Please review your pharmacy choice. Make sure the pharmacy is open so you can pick up prescription now. If there is a problem, you may contact your provider through [employer] and have  the prescription routed to another pharmacy.  Your safety is important to us . If you have drug allergies check your prescription carefully.   For the next 24 hours you can use MyChart to ask questions about today's visit, request a non-urgent call back, or ask for a work or school excuse. You will get an email in the next two days asking about your experience. I hope that your e-visit has been valuable and will speed your recovery.

## 2024-02-20 NOTE — Progress Notes (Signed)
 I have spent 5 minutes in review of e-visit questionnaire, review and updating patient chart, medical decision making and response to patient.   Claiborne Rigg, NP

## 2024-07-09 ENCOUNTER — Telehealth: Admitting: Family Medicine

## 2024-07-09 DIAGNOSIS — H10029 Other mucopurulent conjunctivitis, unspecified eye: Secondary | ICD-10-CM

## 2024-07-09 MED ORDER — POLYMYXIN B-TRIMETHOPRIM 10000-0.1 UNIT/ML-% OP SOLN: 1.0000 [drp] | Freq: Four times a day (QID) | OPHTHALMIC | 0 refills | Status: AC

## 2024-07-09 NOTE — Progress Notes (Signed)
 E-Visit for [employer]   We are sorry that you are not feeling well.  Here is how we plan to help!  Based on what you have shared with me it looks like you have conjunctivitis.  Conjunctivitis is a common inflammatory or infectious condition of the eye that is often referred to as pink eye.  In most cases it is contagious (viral or bacterial). However, not all conjunctivitis requires antibiotics (ex. Allergic).  We have made appropriate suggestions for you based upon your presentation.  I have prescribed Polytrim Ophthalmic drops 1-2 drops 4 times a day times 5 days  Pink eye can be highly contagious.  It is typically spread through direct contact with secretions, or contaminated objects or surfaces that one may have touched.  Strict handwashing is suggested with soap and water is urged.  If not available, use alcohol based had sanitizer.  Avoid unnecessary touching of the eye.  If you wear contact lenses, you will need to refrain from wearing them until you see no white discharge from the eye for at least 24 hours after being on medication.  You should see symptom improvement in 1-2 days after starting the medication regimen.  Call us  if symptoms are not improved in 1-2 days.  Home Care: Wash your hands often! Do not wear your contacts until you complete your treatment plan. Avoid sharing towels, bed linen, personal items with a person who has pink eye. See attention for anyone in your home with similar symptoms.  Get Help Right Away If: Your symptoms do not improve. You develop blurred or loss of vision. Your symptoms worsen (increased discharge, pain or redness)   Thank you for choosing an e-visit.  Your e-visit answers were reviewed by a board certified advanced clinical practitioner to complete your personal care plan. Depending upon the condition, your plan could have included both over the counter or prescription medications.  Please review your pharmacy choice. Make sure the  pharmacy is open so you can pick up prescription now. If there is a problem, you may contact your provider through [employer] and have the prescription routed to another pharmacy.  Your safety is important to us . If you have drug allergies check your prescription carefully.   For the next 24 hours you can use MyChart to ask questions about today's visit, request a non-urgent call back, or ask for a work or school excuse. You will get an email in the next two days asking about your experience. I hope that your e-visit has been valuable and will speed your recovery.  I have spent 5 minutes in review of e-visit questionnaire, review and updating patient chart, medical decision making and response to patient.   Chiquita CHRISTELLA Barefoot, NP

## 2024-07-20 ENCOUNTER — Telehealth: Admitting: Family

## 2024-07-20 DIAGNOSIS — A084 Viral intestinal infection, unspecified: Secondary | ICD-10-CM

## 2024-07-20 MED ORDER — ONDANSETRON HCL 4 MG PO TABS: 4.0000 mg | ORAL_TABLET | Freq: Three times a day (TID) | ORAL | 0 refills | Status: AC | PRN

## 2024-07-20 NOTE — Progress Notes (Signed)
 We are sorry that you are not feeling well. Here is how we plan to help!  Based on what you have shared with me it looks like you have a Virus that is irritating your GI tract.  Vomiting is the forceful emptying of a portion of the stomach's content through the mouth.  Although nausea and vomiting can make you feel miserable, it's important to remember that these are not diseases, but rather symptoms of an underlying illness.  When we treat short term symptoms, we always caution that any symptoms that persist should be fully evaluated in a medical office.  I have prescribed a medication that will help alleviate your symptoms and allow you to stay hydrated:  Zofran  4 mg 1 tablet every 8 hours as needed for nausea and vomiting  HOME CARE: Drink clear liquids.  This is very important! Dehydration (the lack of fluid) can lead to a serious complication.  Start off with 1 tablespoon every 5 minutes for 8 hours. You may begin eating bland foods after 8 hours without vomiting.  Start with saltine crackers, white bread, rice, mashed potatoes, applesauce. After 48 hours on a bland diet, you may resume a normal diet. Try to go to sleep.  Sleep often empties the stomach and relieves the need to vomit.  GET HELP RIGHT AWAY IF:  Your symptoms do not improve or worsen within 2 days after treatment. You have a fever for over 3 days. You cannot keep down fluids after trying the medication.  MAKE SURE YOU:  Understand these instructions. Will watch your condition. Will get help right away if you are not doing well or get worse.   Thank you for choosing an e-visit. Your e-visit answers were reviewed by a board certified advanced clinical practitioner to complete your personal care plan. Depending upon the condition, your plan could have included both over the counter or prescription medications. Please review your pharmacy choice. Be sure that the pharmacy you have chosen is open so that you can pick up  your prescription now.  If there is a problem you may message your provider in MyChart to have the prescription routed to another pharmacy. Your safety is important to us . If you have drug allergies check your prescription carefully.  For the next 24 hours, you can use MyChart to ask questions about today's visit, request a non-urgent call back, or ask for a work or school excuse from your e-visit provider. You will get an e-mail in the next two days asking about your experience. I hope that your e-visit has been valuable and will speed your recovery.  I have spent 5 minutes in review of e-visit questionnaire, review and updating patient chart, medical decision making and response to patient.   Bari Learn, FNP

## 2024-08-28 ENCOUNTER — Encounter: Payer: Self-pay | Admitting: Student

## 2024-11-27 ENCOUNTER — Ambulatory Visit: Payer: Self-pay | Admitting: Student
